# Patient Record
Sex: Female | Born: 1965 | Race: White | Hispanic: No | Smoking: Current every day smoker
Health system: Southern US, Community
[De-identification: ages and names within clinical notes are randomized; demographics above are authoritative.]

## PROBLEM LIST (undated history)

## (undated) DIAGNOSIS — I2699 Other pulmonary embolism without acute cor pulmonale: Secondary | ICD-10-CM

## (undated) DIAGNOSIS — G894 Chronic pain syndrome: Secondary | ICD-10-CM

## (undated) DIAGNOSIS — G43909 Migraine, unspecified, not intractable, without status migrainosus: Secondary | ICD-10-CM

## (undated) DIAGNOSIS — Z720 Tobacco use: Secondary | ICD-10-CM

## (undated) DIAGNOSIS — K635 Polyp of colon: Secondary | ICD-10-CM

## (undated) DIAGNOSIS — K5790 Diverticulosis of intestine, part unspecified, without perforation or abscess without bleeding: Secondary | ICD-10-CM

## (undated) DIAGNOSIS — F329 Major depressive disorder, single episode, unspecified: Secondary | ICD-10-CM

## (undated) DIAGNOSIS — Z800 Family history of malignant neoplasm of digestive organs: Secondary | ICD-10-CM

## (undated) DIAGNOSIS — K449 Diaphragmatic hernia without obstruction or gangrene: Secondary | ICD-10-CM

## (undated) DIAGNOSIS — R079 Chest pain, unspecified: Secondary | ICD-10-CM

## (undated) DIAGNOSIS — R1032 Left lower quadrant pain: Secondary | ICD-10-CM

## (undated) DIAGNOSIS — K219 Gastro-esophageal reflux disease without esophagitis: Secondary | ICD-10-CM

## (undated) DIAGNOSIS — G9050 Complex regional pain syndrome I, unspecified: Secondary | ICD-10-CM

## (undated) DIAGNOSIS — I10 Essential (primary) hypertension: Secondary | ICD-10-CM

## (undated) DIAGNOSIS — E785 Hyperlipidemia, unspecified: Secondary | ICD-10-CM

## (undated) DIAGNOSIS — H539 Unspecified visual disturbance: Secondary | ICD-10-CM

## (undated) DIAGNOSIS — K8020 Calculus of gallbladder without cholecystitis without obstruction: Secondary | ICD-10-CM

## (undated) DIAGNOSIS — G8929 Other chronic pain: Secondary | ICD-10-CM

## (undated) DIAGNOSIS — F419 Anxiety disorder, unspecified: Secondary | ICD-10-CM

## (undated) DIAGNOSIS — Z8669 Personal history of other diseases of the nervous system and sense organs: Secondary | ICD-10-CM

## (undated) HISTORY — DX: Hyperlipidemia, unspecified: E78.5

## (undated) HISTORY — PX: SHOULDER SURGERY: SHX246

## (undated) HISTORY — DX: Diverticulosis of intestine, part unspecified, without perforation or abscess without bleeding: K57.90

## (undated) HISTORY — DX: Other pulmonary embolism without acute cor pulmonale: I26.99

## (undated) HISTORY — DX: Family history of malignant neoplasm of digestive organs: Z80.0

## (undated) HISTORY — DX: Major depressive disorder, single episode, unspecified: F32.9

## (undated) HISTORY — DX: Personal history of other diseases of the nervous system and sense organs: Z86.69

## (undated) HISTORY — DX: Anxiety disorder, unspecified: F41.9

---

## 1997-05-03 ENCOUNTER — Other Ambulatory Visit: Admission: RE | Admit: 1997-05-03 | Discharge: 1997-05-03 | Payer: Self-pay | Admitting: *Deleted

## 1997-07-12 ENCOUNTER — Emergency Department (HOSPITAL_COMMUNITY): Admission: EM | Admit: 1997-07-12 | Discharge: 1997-07-12 | Payer: Self-pay | Admitting: Emergency Medicine

## 1998-02-24 ENCOUNTER — Ambulatory Visit (HOSPITAL_COMMUNITY): Admission: RE | Admit: 1998-02-24 | Discharge: 1998-02-24 | Payer: Self-pay | Admitting: Gastroenterology

## 1998-02-24 ENCOUNTER — Encounter: Payer: Self-pay | Admitting: Gastroenterology

## 1998-08-28 ENCOUNTER — Ambulatory Visit (HOSPITAL_COMMUNITY): Admission: RE | Admit: 1998-08-28 | Discharge: 1998-08-28 | Payer: Self-pay | Admitting: *Deleted

## 1998-08-28 ENCOUNTER — Encounter: Payer: Self-pay | Admitting: *Deleted

## 1998-08-29 ENCOUNTER — Ambulatory Visit (HOSPITAL_COMMUNITY): Admission: RE | Admit: 1998-08-29 | Discharge: 1998-08-29 | Payer: Self-pay | Admitting: *Deleted

## 1998-08-29 ENCOUNTER — Encounter: Payer: Self-pay | Admitting: *Deleted

## 1999-07-02 ENCOUNTER — Other Ambulatory Visit: Admission: RE | Admit: 1999-07-02 | Discharge: 1999-07-02 | Payer: Self-pay | Admitting: *Deleted

## 2000-06-04 ENCOUNTER — Other Ambulatory Visit: Admission: RE | Admit: 2000-06-04 | Discharge: 2000-06-04 | Payer: Self-pay | Admitting: *Deleted

## 2001-10-07 HISTORY — PX: COLONOSCOPY: SHX174

## 2001-11-03 ENCOUNTER — Encounter (INDEPENDENT_AMBULATORY_CARE_PROVIDER_SITE_OTHER): Payer: Self-pay | Admitting: Specialist

## 2001-11-03 ENCOUNTER — Ambulatory Visit (HOSPITAL_COMMUNITY): Admission: RE | Admit: 2001-11-03 | Discharge: 2001-11-03 | Payer: Self-pay | Admitting: Gastroenterology

## 2002-05-08 HISTORY — PX: LAPAROSCOPY: SHX197

## 2002-05-27 ENCOUNTER — Ambulatory Visit (HOSPITAL_COMMUNITY): Admission: RE | Admit: 2002-05-27 | Discharge: 2002-05-27 | Payer: Self-pay | Admitting: General Surgery

## 2002-05-28 ENCOUNTER — Emergency Department (HOSPITAL_COMMUNITY): Admission: EM | Admit: 2002-05-28 | Discharge: 2002-05-28 | Payer: Self-pay | Admitting: Emergency Medicine

## 2002-05-28 ENCOUNTER — Encounter: Payer: Self-pay | Admitting: General Surgery

## 2002-06-17 ENCOUNTER — Other Ambulatory Visit: Admission: RE | Admit: 2002-06-17 | Discharge: 2002-06-17 | Payer: Self-pay | Admitting: Obstetrics and Gynecology

## 2002-07-23 ENCOUNTER — Ambulatory Visit (HOSPITAL_COMMUNITY): Admission: RE | Admit: 2002-07-23 | Discharge: 2002-07-23 | Payer: Self-pay | Admitting: Emergency Medicine

## 2002-11-17 ENCOUNTER — Other Ambulatory Visit: Admission: RE | Admit: 2002-11-17 | Discharge: 2002-11-17 | Payer: Self-pay | Admitting: Obstetrics and Gynecology

## 2002-11-26 ENCOUNTER — Encounter: Admission: RE | Admit: 2002-11-26 | Discharge: 2002-11-26 | Payer: Self-pay | Admitting: Obstetrics and Gynecology

## 2003-07-27 ENCOUNTER — Encounter: Admission: RE | Admit: 2003-07-27 | Discharge: 2003-07-27 | Payer: Self-pay | Admitting: Family Medicine

## 2004-02-08 HISTORY — PX: ESOPHAGOGASTRODUODENOSCOPY: SHX1529

## 2004-02-20 ENCOUNTER — Ambulatory Visit (HOSPITAL_COMMUNITY): Admission: RE | Admit: 2004-02-20 | Discharge: 2004-02-20 | Payer: Self-pay | Admitting: Gastroenterology

## 2004-02-20 ENCOUNTER — Encounter (INDEPENDENT_AMBULATORY_CARE_PROVIDER_SITE_OTHER): Payer: Self-pay | Admitting: *Deleted

## 2009-01-07 HISTORY — PX: ENDOMETRIAL ABLATION: SHX621

## 2009-05-23 ENCOUNTER — Emergency Department (HOSPITAL_BASED_OUTPATIENT_CLINIC_OR_DEPARTMENT_OTHER): Admission: EM | Admit: 2009-05-23 | Discharge: 2009-05-23 | Payer: Self-pay | Admitting: Emergency Medicine

## 2009-05-23 ENCOUNTER — Ambulatory Visit: Payer: Self-pay | Admitting: Diagnostic Radiology

## 2010-03-26 LAB — URINALYSIS, ROUTINE W REFLEX MICROSCOPIC
Glucose, UA: NEGATIVE mg/dL
Hgb urine dipstick: NEGATIVE
Urobilinogen, UA: 0.2 mg/dL (ref 0.0–1.0)

## 2010-05-25 NOTE — Op Note (Signed)
NAME:  Dana Morrow, Dana Morrow                           ACCOUNT NO.:  [id number]   MEDICAL RECORD NO.:  [id number]                   PATIENT TYPE:  AMB   LOCATION:  ENDO                                 FACILITY:  [facility]   PHYSICIAN:  Petra Kuba, M.D.                 DATE OF BIRTH:  09-25-1965   DATE OF PROCEDURE:  DATE OF DISCHARGE:                                 OPERATIVE REPORT   PROCEDURE:  Colonoscopy with polypectomy.   INDICATIONS FOR PROCEDURE:  Abdominal pain, family history of colon cancer.  Consent was signed after risks, benefits, methods, and options were  thoroughly discussed in the office.   MEDICINES USED:  Demerol 100, Versed 10.   DESCRIPTION OF PROCEDURE:  Rectal inspection was pertinent for external  hemorrhoids. Digital exam was negative. The pediatric video adjustable  colonoscope was inserted, easily advanced around the colon to the cecum.  This did not require any abdominal pressure or any position changes.  Other  than some scattered diverticula, no abnormality was seen. The cecum was  identified by the appendiceal orifice and the ileocecal valve. In fact, the  scope was inserted a short ways into the terminal ileum which was normal.  Photo documentation was obtained. The scope was slowly withdrawn. The prep  was adequate. There was some liquid stool that required washing and  suctioning on slow withdrawal through the colon. The cecum, ascending and  transverse were normal except for some scattered diverticula. To the  proximal level of the splenic flexure, a questionable tiny 1-2 mm polyp on a  fold was seen; however, in turning the biopsy, it seemed to flatten out and  we could not it and we elected to withdraw. It had no worrisome stigmata.  The scope was then further withdrawn. There was a rare left sided  diverticula. In the distal sigmoid, a 1-2 mm polyp was seen and hot biopsied  x1. The scope was further withdrawn back to the rectum and retroflexed  pertinent for some tiny internal hemorrhoids. The scope was straightened and  readvanced a short ways up the left side of the colon, air was suctioned,  scope removed. The patient tolerated the procedure well. There was no  obvious or immediate complications.   ENDOSCOPIC DIAGNOSIS:  1. Internal and external hemorrhoids.  2. Right greater than left diverticula scattered.  3. Tiny distal sigmoid polyp hot biopsied.  4. Otherwise within normal limits to the end of the terminal ileum.    PLAN:  Await pathology to determine future colonic screening. Continue  Protonix since it seems to be helping. Consider upper GI small bowel follow  through or even an ultrasound next although since the patient is improving  with diet and exercise may just follow her clinically. Will schedule follow-  up in 6-8 weeks when we review the biopsies but happy to see back sooner,  continue testing as above  p.r.n.                                               Petra Kuba, M.D.    MEM/MEDQ  D:  11/03/2001  T:  11/03/2001  Job:  [id number]   cc:   Dr. Ned Card OB/GYN   Dr. Carolynn Sayers, [place]

## 2010-05-25 NOTE — Op Note (Signed)
NAME:  Dana Morrow, Dana Morrow                           ACCOUNT NO.:  [id number]   MEDICAL RECORD NO.:  [id number]                   PATIENT TYPE:  AMB   LOCATION:  DAY                                  FACILITY:  [facility]   PHYSICIAN:  Timothy E. Earlene Plater, M.D.              DATE OF BIRTH:  [birth year]   DATE OF PROCEDURE:  05/27/2002  DATE OF DISCHARGE:                                 OPERATIVE REPORT   PREOPERATIVE DIAGNOSIS:  Persistent chronic left lower quadrant pain.   POSTOPERATIVE DIAGNOSIS:  Persistent chronic left lower quadrant pain.   PROCEDURE:  Laparoscopy.   SURGEON:  Timothy E. Earlene Plater, M.D.   ANESTHESIA:  General.   INDICATIONS FOR PROCEDURE:  See the enclosed notes. Dana Morrow is 73, has  persistent left lower quadrant pain. She has been seen and evaluated on  several occasions by Maxie Better, M.D., Petra Kuba, M.D. and  Nolon Nations, M.D.  I have see the patient twice in the office for  extensive consultations regarding surgery for this pain. She has had  endoscopy, CT scan, small bowel series. Because of persistent pain, she  insists that she wishes to undergo laparoscopy. This has been carefully  discussed as well as the expected success and potential failures and  complications.   DESCRIPTION OF PROCEDURE:  The patient was seen and evaluated and the permit  signed.   She was taken to the operating room and placed supine, general endotracheal  anesthesia administered. The entire abdomen was prepped and draped in the  usual fashion. There was a Pfannenstiel incision from previous cesarean  section. There appeared to be two tiny circumareolar incisions, one superior  and one inferior. Marcaine 0.5% with epinephrine was used prior to each  incision. A horizontal incision made in the umbilical crease inferiorly,  fascia identified, opened vertically, peritoneum entered without  complications. The Hasson catheter placed and tied in place with a fascial  suture of #1  Vicryl. The abdomen was insufflated. With the patient in the  exaggerated head up position, the upper abdomen and mid abdomen were  carefully evaluated, the gallbladder had a normal appearance as did the  stomach. The remainder of the mid abdomen was covered with thick omentum.  The patient was then placed in the head down position and the pelvis and  lower mid abdomen were carefully evaluated. Photographs were made in each  area of each organ system and will be enumerated. In essence, there were  absolutely no adhesions. The colon and small bowel had absolutely a normal  appearance as did the appendix and gallbladder. The uterus appeared enlarged  and inflamed, both ovaries had some small adhesion processes. There was no  fluid in the pelvis nor abdomen on initial laparoscopy.   Photographs enclosed in the chart and one copy for my office chart include  10 photographs and they are:  1. Gallbladder.  2. Left  ovary.  3. Uterus.  4. Right ovary.  5. Pelvis.  6. Pelvis with uterus elevated.  7. Left lower quadrant.  8. Left lower quadrant with external compression.  9. Small bowel and pelvis.  10.      Appendix.   I did not find any etiology for her chronic persistent pain. The patient and  her consultants will be advised and followup will be as indicated. All  counts correct, she tolerated it well. All catheters, CO2 removed under  direct vision. The infraumbilical site was tied with existing #1 Vicryl and  the skin incisions closed with 3-0 Monocryl. Steri-Strips applied, final  counts correct, she tolerated it well and was removed to the recovery room  in good condition.   Instructions and Percocet 5 mg #30 were given and she will be followed as an  outpatient.                                               Timothy E. Earlene Plater, M.D.    TED/MEDQ  D:  05/27/2002  T:  05/27/2002  Job:  [id number]   cc:   Nolon Nations, M.D.   Petra Kuba, M.D.  [address], [address]   [place]  [place] [address]  Fax: [phone]   Maxie Better, M.D.  [address]  [address]  [place]  [place] [address]  Fax: [phone]

## 2010-05-25 NOTE — Op Note (Signed)
NAMESHAIRA, SOVA                 ACCOUNT NO.:  [id number]   MEDICAL RECORD NO.:  [id number]          PATIENT TYPE:  AMB   LOCATION:  ENDO                         FACILITY:  [facility]   PHYSICIAN:  Petra Kuba, M.D.    DATE OF BIRTH:  [birth year]   DATE OF PROCEDURE:  02/20/2004  DATE OF DISCHARGE:                                 OPERATIVE REPORT   PROCEDURE:  EGD with biopsy.   INDICATIONS:  Abdominal pain.  History of reflux.  Brother with esophageal  cancer.  Consent was signed after risks, benefits, methods, options were  thoroughly discussed in the office.   MEDICINES USED:  1.  Demerol 100.  2.  Versed 10.   PROCEDURE:  The video endoscope was inserted by direct vision.  The proximal  and mid-esophagus were normal.  In the distal esophagus was a small hiatal  hernia.  No obvious signs of Barrett's were seen.  The scope passed into the  stomach and advanced through a normal antrum, normal pylorus, into a normal  duodenal bulb and around the sweep to a normal second portion of the  duodenum.  The scope was withdrawn back to the bulb, and a good look there  ruled out ulcers in that location.  The scope was withdrawn back to the  stomach and retroflexed.  Angularis, cardia, fundus, lesser and greater  curve were normal on retroflexed visualization.  Straight visualization of  the stomach did not reveal any initial findings.  The scope was then slowly  withdrawn back to about 20 cm.  No additional findings were seen.  We went  ahead and took a few biopsies just above the GE junction to rule out any  microscopic Barrett's.  Air was suctioned.  Scope removed.  Again, a good  look at the esophagus was normal.  Scope was removed.  The patient tolerated  the procedure well.  There was no obvious immediate complication.   ENDOSCOPIC DIAGNOSES:  1.  Small hiatal hernia.  2.  Doubt Barrett's, with gastroesophageal junction biopsies.  3.  Otherwise normal esophagogastroduodenoscopy.   PLAN:  Await pathology.  Continue pump inhibitors.  Get her [facility]  ultrasound and CT scan to bring her chart up to date.  I would be happy to  see her back p.r.n.  Otherwise, return care to Dr. ___________ p.r.n.      MEM/MEDQ  D:  02/20/2004  T:  02/20/2004  Job:  [id number]   cc:   Petra Kuba, M.D.  [address]  [place]  [place] [address]  Fax: [phone]   __________, Judie Petit.D.

## 2010-11-19 ENCOUNTER — Emergency Department (HOSPITAL_BASED_OUTPATIENT_CLINIC_OR_DEPARTMENT_OTHER)
Admission: EM | Admit: 2010-11-19 | Discharge: 2010-11-20 | Disposition: A | Discharge status: Home / Self Care | Payer: No Typology Code available for payment source | Attending: Emergency Medicine | Admitting: Emergency Medicine

## 2010-11-19 ENCOUNTER — Emergency Department (INDEPENDENT_AMBULATORY_CARE_PROVIDER_SITE_OTHER): Payer: No Typology Code available for payment source

## 2010-11-19 ENCOUNTER — Emergency Department (HOSPITAL_COMMUNITY)
Admission: EM | Admit: 2010-11-19 | Discharge: 2010-11-19 | Disposition: A | Discharge status: Home / Self Care | Payer: Self-pay

## 2010-11-19 ENCOUNTER — Encounter: Payer: Self-pay | Admitting: *Deleted

## 2010-11-19 DIAGNOSIS — W19XXXA Unspecified fall, initial encounter: Secondary | ICD-10-CM

## 2010-11-19 DIAGNOSIS — M25519 Pain in unspecified shoulder: Secondary | ICD-10-CM | POA: Insufficient documentation

## 2010-11-19 DIAGNOSIS — T148XXA Other injury of unspecified body region, initial encounter: Secondary | ICD-10-CM

## 2010-11-19 DIAGNOSIS — M533 Sacrococcygeal disorders, not elsewhere classified: Secondary | ICD-10-CM | POA: Insufficient documentation

## 2010-11-19 HISTORY — DX: Complex regional pain syndrome I, unspecified: G90.50

## 2010-11-19 MED ORDER — ONDANSETRON 8 MG PO TBDP
8.0000 mg | ORAL_TABLET | Freq: Once | ORAL | Status: AC
  Administered 2010-11-19: 8 mg via ORAL
  Filled 2010-11-19: qty 1

## 2010-11-19 MED ORDER — HYDROCODONE-ACETAMINOPHEN 5-325 MG PO TABS
1.0000 | ORAL_TABLET | Freq: Once | ORAL | Status: AC
  Administered 2010-11-19: 1 via ORAL
  Filled 2010-11-19: qty 1

## 2010-11-19 NOTE — ED Notes (Signed)
Pt presents to ED today after fall from standing.  Pt reports she slipped on "a liquid maybe coffee" on the floor outside 2900 at main campus.  Pt reports that security was called and came to assist pt and family.  Pt was walked to [place] by security and was told at that time to call risk mgt in the morning and that it would be a 3 hour wait until being able to be evaluated by md.  Pt requested to leave [place] and come to our facility.  Pt was not given any paperwork and was not given a name or contact number for risk mgt.  Upon arrival to our facility, pt is ambulatory with pain and tenderness to left side of body.  Pts majority of pain centered above coccyx and is non-radiating.  Pt was given no pain meds at [facility].  Dr Oletta Lamas and Dr Dierdre Highman notified of patients condition and reason for fall.

## 2010-11-19 NOTE — ED Notes (Signed)
Pt. Reports falling on the L side of her hip and has L side body pain with Low back  Pain and tail bone pain.

## 2010-11-19 NOTE — ED Notes (Signed)
Dana Morrow, Charge RN called and spoke with Dana Morrow at [facility].  She was unaware of incident and stated she would call 2900 and find out particulars and what had been initiated.  She stated she would get back to [place] with further information.

## 2010-11-19 NOTE — ED Notes (Signed)
Spoke with Sharma Covert at [facility] who advised that a SZP needed to be filled out on this pt as one was not completed by anyone at [facility].  I advised Victorino Dike of reports from pt to include: employee laughing after fall, no one assisting her from floor, walking to ED and being told about 3 hour wait, and that pt was advised to contact risk mgt in the am and not given number or offered to have call placed for her.  She further advised that if a member of mgt was available to see pt then they should be advised of situation.  I informed her that at night we did not have anyone in the building other than the charge RN.

## 2010-11-19 NOTE — ED Provider Notes (Signed)
History     CSN: [id number] Arrival date & time: 11/19/2010 10:40 PM   First MD Initiated Contact with Patient 11/19/10 2259      Chief Complaint  Patient presents with  . Back Pain    Pt. reports falling at approx. 2000 tonight at [facility]. in the ICU 2900 area.  Pt. reported the incident and security came to scene.  Pt. had a full report done.  . Tailbone Pain    (Consider location/radiation/quality/duration/timing/severity/associated sxs/prior treatment) Patient is a 45 y.o. female presenting with back pain. The history is provided by the patient.  Back Pain  This is a new problem. The current episode started 3 to 5 hours ago. The problem occurs constantly. The problem has not changed since onset.The pain is associated with falling. Pain location: tail bone mostly L sided. Quality: sharp. The pain does not radiate. The pain is moderate. The symptoms are aggravated by twisting. Pertinent negatives include no chest pain, no fever, no numbness, no headaches, no abdominal pain, no bowel incontinence, no perianal numbness, no bladder incontinence, no leg pain, no paresthesias, no paresis, no tingling and no weakness.   States she slipped while ambulating and fell onto there buttocks where she hurts, between 7:30-8pm tonight. She fell mostly onto her L sided and also had some L shoulder discomfort. She denies striking her head or hurting her neck. She was able to ambulate after event. No knee or ankle injury. No wrist, elbow or hand injury.  She has h/o RSD and some low back pain in the past that has required injections, but denies any recent back issues.  She denies any mid or lower back tonight, she localizes pain to her sacral area. No weakness in her extremities.   Past Medical History  Diagnosis Date  . RSD (reflex sympathetic dystrophy)     L shoulder    Past Surgical History  Procedure Date  . Cesarean section   . Shoulder surgery     No family history on  file.  History  Substance Use Topics  . Smoking status: Not on file  . Smokeless tobacco: Not on file  . Alcohol Use:     OB History    Grav Para Term Preterm Abortions TAB SAB Ect Mult Living                  Review of Systems  Constitutional: Negative for fever and chills.  HENT: Negative for neck pain and neck stiffness.   Eyes: Negative for pain.  Respiratory: Negative for shortness of breath.   Cardiovascular: Negative for chest pain and leg swelling.  Gastrointestinal: Positive for vomiting. Negative for abdominal pain and bowel incontinence.  Genitourinary: Negative for bladder incontinence, hematuria and flank pain.  Musculoskeletal: Negative for joint swelling and gait problem.  Skin: Negative for rash and wound.  Neurological: Negative for tingling, weakness, numbness, headaches and paresthesias.  All other systems reviewed and are negative.    Allergies  Levaquin  Home Medications   Current Outpatient Rx  Name Route Sig Dispense Refill  . METOPROLOL SUCCINATE 25 MG PO TB24 Oral Take 25 mg by mouth daily.      . MORPHINE SULFATE ER 30 MG PO TB12 Oral Take 30 mg by mouth every 8 (eight) hours.      [name] ONE-DAILY MULTI VITAMINS PO TABS Oral Take 1 tablet by mouth daily.        BP 161/93  Pulse 82  Temp(Src) 98.1 F (36.7 C) (  Oral)  Resp 20  Ht 5\' 3"  (1.6 m)  Wt 163 lb (73.936 kg)  BMI 28.87 kg/m2  SpO2 99%  Physical Exam  Constitutional: She is oriented to person, place, and time. She appears well-developed and well-nourished.  HENT:  Head: Normocephalic and atraumatic.  Eyes: Conjunctivae and EOM are normal. Pupils are equal, round, and reactive to light.  Neck: Full passive range of motion without pain. Neck supple. No thyromegaly present.       No cervical, thoracic or lumbar tenderness or deformity.   Cardiovascular: Normal rate, regular rhythm, S1 normal, S2 normal and intact distal pulses.   Pulmonary/Chest: Effort normal and breath sounds  normal.  Abdominal: Soft. Bowel sounds are normal. There is no tenderness. There is no CVA tenderness.  Musculoskeletal: Normal range of motion.       Mild TTP over left shoulder, no deformity or step off.  Dec ROM 2/2 pain. Skin intact. No clavicular or elbow tenderness. No wrist or hand tenderness with FROM at elbow and wrist and distal N/V intact.  LEs good ROM, no hip or knee or ankle tenderness.  She is TTP over sacral area without any appreciable swelling, erythema or deformity. Distal strengths and sensorium to light touch intact and equal BLEs with adequate dpps.   Neurological: She is alert and oriented to person, place, and time. She has normal strength and normal reflexes. No cranial nerve deficit or sensory deficit. She displays a negative Romberg sign. GCS eye subscore is 4. GCS verbal subscore is 5. GCS motor subscore is 6.       Normal Gait  Skin: Skin is warm and dry. No rash noted. No cyanosis. Nails show no clubbing.  Psychiatric: She has a normal mood and affect. Her speech is normal and behavior is normal.    ED Course  Procedures (including critical care time)   Results for orders placed during the hospital encounter of 05/23/09  URINALYSIS, ROUTINE W REFLEX MICROSCOPIC      Component Value Range   Color, Urine AMBER BIOCHEMICALS MAY BE AFFECTED BY COLOR (*) YELLOW    Appearance CLOUDY (*) CLEAR    Specific Gravity, Urine 1.037 (*) 1.005 - 1.030    pH 6.0  5.0 - 8.0    Glucose, UA NEGATIVE  NEGATIVE (mg/dL)   Hgb urine dipstick NEGATIVE  NEGATIVE    Bilirubin Urine SMALL (*) NEGATIVE    Ketones, ur NEGATIVE  NEGATIVE (mg/dL)   Protein, ur NEGATIVE  NEGATIVE (mg/dL)   Urobilinogen, UA 0.2  0.0 - 1.0 (mg/dL)   Nitrite NEGATIVE  NEGATIVE    Leukocytes, UA    NEGATIVE    Value: NEGATIVE MICROSCOPIC NOT DONE ON URINES WITH NEGATIVE PROTEIN, BLOOD, LEUKOCYTES, NITRITE, OR GLUCOSE <1000 mg/dL.   Dg Sacrum/coccyx  11/19/2010  *RADIOLOGY REPORT*  Clinical Data:  45 year old female status post fall with tail bone pain.  SACRUM AND COCCYX - 2+ VIEW  Comparison: CT pelvis 05/23/2009.  Findings: Stable lateral configuration of the sacrum and coccygeal segments. Sacral ala appear intact.  SI joints within normal limits.  Visualized pelvis and lower lumbar levels appear grossly intact.  IMPRESSION: No acute fracture or dislocation identified about the sacrum or coccyx.  Original Report Authenticated By: Harley Hallmark, M.D.   Dg Shoulder Left  11/19/2010  *RADIOLOGY REPORT*  Clinical Data: Status post fall; left shoulder pain.  LEFT SHOULDER - 2+ VIEW  Comparison: None.  Findings: There is no evidence of fracture or dislocation.  The  left humeral head is seated within the glenoid fossa.  The acromioclavicular joint is unremarkable in appearance.  No significant soft tissue abnormalities are seen.  The visualized portions of the left lung are clear.  IMPRESSION: No evidence of fracture or dislocation.  Original Report Authenticated By: Tonia Ghent, M.D.      MDM   Fall with sacral and left shoulder pain, no deficits. Pain control, ice and imaging obtained and reviewed as above. Plan sling LUE for comfort and cont home pain meds as needed. NSAIDS Rx and follow up PCP. Ortho referral prn persistent shoulder pain and possible soft tissue injury.        Sunnie Nielsen, MD 11/20/10 [phone]

## 2010-11-20 ENCOUNTER — Encounter (HOSPITAL_BASED_OUTPATIENT_CLINIC_OR_DEPARTMENT_OTHER): Payer: Self-pay | Admitting: *Deleted

## 2010-11-20 MED ORDER — ONDANSETRON HCL 4 MG PO TABS: 4.0000 mg | ORAL_TABLET | Freq: Four times a day (QID) | ORAL | Status: AC

## 2010-11-20 MED ORDER — NAPROXEN 375 MG PO TABS: 375.0000 mg | ORAL_TABLET | Freq: Two times a day (BID) | ORAL | Status: AC

## 2010-11-20 NOTE — ED Notes (Signed)
Spoke with Angelena,  Night shift Sec who had information re: initial phone call from [facility] ED.  She stated that a female had called from [facility] ED with the pt sitting in front of her and stated "I am sending her to you because we have a 3+hr wait"  She had gotten pts name and DOB direct from pt.  Informed Sharma Covert of above information

## 2010-11-20 NOTE — ED Notes (Signed)
Spoke with Sharma Covert at [facility].  She stated she followed up with [place] and was informed that pt was listed as an elopement as she left before she was triaged.  I re-iterated the fact that pt should not have had to sit in triage nor be told that she would have to wait 3+hrs.  Victorino Dike agreed and stated she would continue to try and follow up with security

## 2010-11-20 NOTE — ED Notes (Signed)
Spoke with Victorino Dike again prior to discharging pt.  She advised she would be contacting risk mgt and they would/will contact pt.  I gave the pt and family the phone number of our dept director if any further follow up was needed.

## 2011-01-08 DIAGNOSIS — F419 Anxiety disorder, unspecified: Secondary | ICD-10-CM

## 2011-01-08 DIAGNOSIS — F32A Depression, unspecified: Secondary | ICD-10-CM

## 2011-01-08 DIAGNOSIS — Z8669 Personal history of other diseases of the nervous system and sense organs: Secondary | ICD-10-CM

## 2011-01-08 HISTORY — DX: Anxiety disorder, unspecified: F41.9

## 2011-01-08 HISTORY — DX: Depression, unspecified: F32.A

## 2011-01-08 HISTORY — DX: Personal history of other diseases of the nervous system and sense organs: Z86.69

## 2011-10-19 HISTORY — PX: LAPAROSCOPIC CHOLECYSTECTOMY: SUR755

## 2011-12-18 ENCOUNTER — Observation Stay (HOSPITAL_BASED_OUTPATIENT_CLINIC_OR_DEPARTMENT_OTHER)
Admission: EM | Admit: 2011-12-18 | Discharge: 2011-12-19 | Disposition: A | Discharge status: Home / Self Care | Payer: Medicare Other | Attending: Emergency Medicine | Admitting: Emergency Medicine

## 2011-12-18 ENCOUNTER — Emergency Department (HOSPITAL_BASED_OUTPATIENT_CLINIC_OR_DEPARTMENT_OTHER): Payer: Medicare Other

## 2011-12-18 ENCOUNTER — Encounter (HOSPITAL_BASED_OUTPATIENT_CLINIC_OR_DEPARTMENT_OTHER): Payer: Self-pay | Admitting: *Deleted

## 2011-12-18 DIAGNOSIS — F17200 Nicotine dependence, unspecified, uncomplicated: Secondary | ICD-10-CM | POA: Insufficient documentation

## 2011-12-18 DIAGNOSIS — Z8739 Personal history of other diseases of the musculoskeletal system and connective tissue: Secondary | ICD-10-CM | POA: Insufficient documentation

## 2011-12-18 DIAGNOSIS — R0789 Other chest pain: Secondary | ICD-10-CM | POA: Insufficient documentation

## 2011-12-18 DIAGNOSIS — G459 Transient cerebral ischemic attack, unspecified: Secondary | ICD-10-CM | POA: Insufficient documentation

## 2011-12-18 DIAGNOSIS — Z8719 Personal history of other diseases of the digestive system: Secondary | ICD-10-CM | POA: Insufficient documentation

## 2011-12-18 DIAGNOSIS — K219 Gastro-esophageal reflux disease without esophagitis: Secondary | ICD-10-CM | POA: Insufficient documentation

## 2011-12-18 DIAGNOSIS — G43909 Migraine, unspecified, not intractable, without status migrainosus: Principal | ICD-10-CM | POA: Insufficient documentation

## 2011-12-18 DIAGNOSIS — Z79899 Other long term (current) drug therapy: Secondary | ICD-10-CM | POA: Insufficient documentation

## 2011-12-18 HISTORY — DX: Chest pain, unspecified: R07.9

## 2011-12-18 HISTORY — DX: Polyp of colon: K63.5

## 2011-12-18 HISTORY — DX: Diaphragmatic hernia without obstruction or gangrene: K44.9

## 2011-12-18 HISTORY — DX: Unspecified visual disturbance: H53.9

## 2011-12-18 HISTORY — DX: Gastro-esophageal reflux disease without esophagitis: K21.9

## 2011-12-18 HISTORY — DX: Tobacco use: Z72.0

## 2011-12-18 HISTORY — DX: Migraine, unspecified, not intractable, without status migrainosus: G43.909

## 2011-12-18 HISTORY — DX: Left lower quadrant pain: R10.32

## 2011-12-18 HISTORY — DX: Calculus of gallbladder without cholecystitis without obstruction: K80.20

## 2011-12-18 HISTORY — DX: Other chronic pain: G89.29

## 2011-12-18 LAB — CBC WITH DIFFERENTIAL/PLATELET
Basophils Absolute: 0.1 10*3/uL (ref 0.0–0.1)
Basophils Relative: 1 % (ref 0–1)
Eosinophils Absolute: 0.2 10*3/uL (ref 0.0–0.7)
Eosinophils Relative: 2 % (ref 0–5)
HCT: 40.1 % (ref 36.0–46.0)
Lymphs Abs: 4.4 10*3/uL — ABNORMAL HIGH (ref 0.7–4.0)
MCHC: 35.4 g/dL (ref 30.0–36.0)
MCV: 90.5 fL (ref 78.0–100.0)
Monocytes Absolute: 1 10*3/uL (ref 0.1–1.0)
Monocytes Relative: 8 % (ref 3–12)
Platelets: 273 10*3/uL (ref 150–400)
RDW: 13.5 % (ref 11.5–15.5)
WBC: 12.6 10*3/uL — ABNORMAL HIGH (ref 4.0–10.5)

## 2011-12-18 LAB — BASIC METABOLIC PANEL
BUN: 11 mg/dL (ref 6–23)
Chloride: 104 mEq/L (ref 96–112)
GFR calc non Af Amer: 86 mL/min — ABNORMAL LOW (ref >90)
Glucose, Bld: 125 mg/dL — ABNORMAL HIGH (ref 70–99)
Potassium: 3.5 mEq/L (ref 3.5–5.1)

## 2011-12-18 MED ORDER — ENOXAPARIN SODIUM 40 MG/0.4ML ~~LOC~~ SOLN
40.0000 mg | SUBCUTANEOUS | Status: DC
  Administered 2011-12-19: 40 mg via SUBCUTANEOUS
  Filled 2011-12-18 (×2): qty 0.4

## 2011-12-18 MED ORDER — PROMETHAZINE HCL 25 MG/ML IJ SOLN
25.0000 mg | Freq: Once | INTRAMUSCULAR | Status: AC
  Administered 2011-12-18: 25 mg via INTRAVENOUS
  Filled 2011-12-18: qty 1

## 2011-12-18 MED ORDER — MORPHINE SULFATE 4 MG/ML IJ SOLN
4.0000 mg | Freq: Once | INTRAMUSCULAR | Status: AC
  Administered 2011-12-18: 4 mg via INTRAVENOUS

## 2011-12-18 MED ORDER — MORPHINE SULFATE 4 MG/ML IJ SOLN
INTRAMUSCULAR | Status: AC
  Administered 2011-12-18: 4 mg via INTRAVENOUS
  Filled 2011-12-18: qty 1

## 2011-12-18 MED ORDER — LISINOPRIL 20 MG PO TABS
20.0000 mg | ORAL_TABLET | Freq: Every day | ORAL | Status: DC
  Administered 2011-12-19: 20 mg via ORAL
  Filled 2011-12-18: qty 1

## 2011-12-18 MED ORDER — HYDROMORPHONE HCL PF 1 MG/ML IJ SOLN
1.0000 mg | Freq: Once | INTRAMUSCULAR | Status: AC
  Administered 2011-12-18: 1 mg via INTRAVENOUS
  Filled 2011-12-18: qty 1

## 2011-12-18 MED ORDER — ASPIRIN 325 MG PO TABS
325.0000 mg | ORAL_TABLET | Freq: Every day | ORAL | Status: DC
  Administered 2011-12-19: 325 mg via ORAL
  Filled 2011-12-18: qty 1

## 2011-12-18 MED ORDER — PANTOPRAZOLE SODIUM 40 MG PO TBEC
40.0000 mg | DELAYED_RELEASE_TABLET | Freq: Every day | ORAL | Status: DC
  Administered 2011-12-19: 40 mg via ORAL
  Filled 2011-12-18: qty 1

## 2011-12-18 MED ORDER — ONDANSETRON HCL 4 MG/2ML IJ SOLN
4.0000 mg | Freq: Once | INTRAMUSCULAR | Status: AC
  Administered 2011-12-18: 4 mg via INTRAVENOUS

## 2011-12-18 MED ORDER — ONDANSETRON HCL 4 MG/2ML IJ SOLN
INTRAMUSCULAR | Status: AC
  Administered 2011-12-18: 4 mg via INTRAVENOUS
  Filled 2011-12-18: qty 2

## 2011-12-18 MED ORDER — ADULT MULTIVITAMIN W/MINERALS CH
1.0000 | ORAL_TABLET | Freq: Every day | ORAL | Status: DC
  Administered 2011-12-19: 1 via ORAL
  Filled 2011-12-18: qty 1

## 2011-12-18 NOTE — ED Notes (Addendum)
Pt states that she has had a headache for 5 days. Pt states that when driving today she went blind in her L eye. She states that she has experienced this about 5 times in the past year.  She states that she was diagnosed with complex migraines.  Pt states that starting today she began feeling tightness in the middle of her chest. Fiance at bedside. Pt currently rates that pain in her chest and head at 5. Pt states that her mom died last Christmas Eve from a massive heart attack.

## 2011-12-18 NOTE — ED Notes (Signed)
MD at bedside. 

## 2011-12-18 NOTE — ED Notes (Signed)
Pt states she has also had some tightness in chest today.

## 2011-12-18 NOTE — ED Notes (Signed)
Pt returned from radiology via stretcher

## 2011-12-18 NOTE — ED Notes (Signed)
Pt states she has had a HA since sat that has waxed and waned. Pt came to ED tonight due to having episode of "blindness" in left eye tonight that lasted approximately 5 min while driving tonight. Pt also reports some dizziness at that time.

## 2011-12-18 NOTE — ED Notes (Signed)
[facility] here for transport now.

## 2011-12-18 NOTE — ED Notes (Addendum)
Pt c/o " complicated migraine" x 4 days, recent stress test neg

## 2011-12-18 NOTE — ED Notes (Signed)
MD made aware of pain, orders received and initiated

## 2011-12-18 NOTE — ED Notes (Signed)
PA at bedside.

## 2011-12-18 NOTE — ED Provider Notes (Signed)
History     CSN: [id number]  Arrival date & time 12/18/11  2021   First MD Initiated Contact with Patient 12/18/11 2032      Chief Complaint  Patient presents with  . 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 No language interpreter was used.    Past Medical History  Diagnosis Date  . RSD (reflex sympathetic dystrophy)     L shoulder  . Migraine     Past Surgical History  Procedure Date  . Cesarean section   . Shoulder surgery   . Cholecystectomy     History reviewed. No pertinent family history.  History  Substance Use Topics  . Smoking status: Current Every Day Smoker -- 0.5 packs/day    Types: Cigarettes  . Smokeless tobacco: Not on file  . Alcohol Use: No    OB History    Grav Para Term Preterm Abortions TAB SAB Ect Mult Living                  Review of Systems  Constitutional: Negative.   Respiratory: Negative.   Cardiovascular: Negative.     Allergies  Levaquin  Home Medications   Current Outpatient Rx  Name  Route  Sig  Dispense  Refill  . LISINOPRIL 20 MG PO TABS   Oral   Take 20 mg by mouth daily.         [name] ONE-DAILY MULTI VITAMINS PO TABS   Oral   Take 1 tablet by mouth daily.           [name] OMEPRAZOLE 20 MG PO CPDR   Oral   Take 20 mg by mouth daily.         [name] METOPROLOL SUCCINATE ER 25 MG PO TB24   Oral   Take 25 mg by mouth daily.           . MORPHINE SULFATE ER 30 MG PO TB12   Oral   Take 30 mg  by mouth every 8 (eight) hours.             BP 148/76  Pulse 92  Temp 98.1 F (36.7 C) (Oral)  Resp 18  Ht 5\' 3"  (1.6 m)  Wt 178 lb (80.74 kg)  BMI 31.53 kg/m2  SpO2 99%  Physical Exam  Nursing note and vitals reviewed. Constitutional: She is oriented to person, place, and time. She appears well-developed and well-nourished.  HENT:  Head: Normocephalic and atraumatic.  Right Ear: External ear normal.  Left Ear: External ear normal.  Mouth/Throat: Oropharynx is clear and moist.  Eyes: Conjunctivae normal and EOM are normal. Pupils are equal, round, and reactive to light.  Neck: Normal range of motion. Neck supple.  Cardiovascular: Normal rate and regular rhythm.   Pulmonary/Chest: Effort normal and breath sounds normal.  Musculoskeletal: Normal range of motion.  Neurological: She is alert and oriented to person, place, and time. She exhibits normal muscle tone. Coordination normal.  Skin: Skin is warm and dry.  Psychiatric: She  has a normal mood and affect.    ED Course  Procedures (including critical care time)  Labs Reviewed - No data to display Dg Chest 2 View  12/18/2011  *RADIOLOGY REPORT*  Clinical Data: Migraine headache for 5 days, hypertension, smoker  CHEST - 2 VIEW  Comparison: None  Findings: Upper normal heart size. Mediastinal contours and pulmonary vascularity normal. Lungs clear. No pleural effusion or pneumothorax. Bones unremarkable.  IMPRESSION: No acute abnormalities.   Original Report Authenticated By: Ulyses Southward, M.D.    Ct Head Wo Contrast  12/18/2011  *RADIOLOGY REPORT*  Clinical Data: Severe headache, blurred vision, history migraines  CT HEAD WITHOUT CONTRAST  Technique:  Contiguous axial images were obtained from the base of the skull through the vertex without contrast.  Comparison: None  Findings: Normal ventricular morphology. No midline shift or mass effect. Normal appearance of brain parenchyma. No intracranial hemorrhage, mass lesion, or  acute infarction. Visualized paranasal sinuses and mastoid air cells clear. Bones unremarkable.  IMPRESSION: No acute intracranial abnormalities.   Original Report Authenticated By: Ulyses Southward, M.D.      Date: 12/18/2011  Rate:91  Rhythm: normal sinus rhythm  QRS Axis: normal  Intervals: normal  ST/T Wave abnormalities: normal  Conduction Disutrbances:none  Narrative Interpretation:   Old EKG Reviewed: none available   No diagnosis found.    MDM  [facility] pt [facility] to [facility] for [facility] protocol:pt accepted by Dr.yelverton:pt is symptom free at this time:pt has htn and high cholesterol and smoker as risk factors        Teressa Lower, NP 12/18/11 2222

## 2011-12-18 NOTE — ED Provider Notes (Signed)
Patient placed in CDU by Teressa Lower, PA-C for TIA protocol; patient transferred from med center [facility].  Patient is here for 5 days of headache and left eye blindness and has received labs and imaging.   Plan per previous provider is to complete a TIA protocol in the morning and consult neurology if MRI is positive.  Patient re-evaluated and is resting comfortably, VSS, with no new complaints or concerns at this time.  On exam: hemodynamically stable, NAD, heart w/ RRR, lungs CTAB, Chest & abd non-tender, no peripheral edema or calf tenderness.  BP 148/76  Pulse 92  Temp 98.1 F (36.7 C) (Oral)  Resp 18  Ht 5\' 3"  (1.6 m)  Wt 178 lb (80.74 kg)  BMI 31.53 kg/m2  SpO2 99%  Discussed with patient current lab and imaging results as well as their care plan, patient questions answered.  Patient is amenable to the plan.  Pt c/o increased nausea, will give Zofran.     Dahlia Client Dayannara Pascal, PA-C 12/19/11 0000

## 2011-12-19 ENCOUNTER — Observation Stay (HOSPITAL_COMMUNITY): Payer: Medicare Other

## 2011-12-19 ENCOUNTER — Encounter (HOSPITAL_COMMUNITY): Payer: Self-pay | Admitting: Nurse Practitioner

## 2011-12-19 DIAGNOSIS — G459 Transient cerebral ischemic attack, unspecified: Secondary | ICD-10-CM

## 2011-12-19 DIAGNOSIS — R0789 Other chest pain: Secondary | ICD-10-CM

## 2011-12-19 LAB — APTT: aPTT: 32 seconds (ref 24–37)

## 2011-12-19 LAB — URINALYSIS, ROUTINE W REFLEX MICROSCOPIC
Bilirubin Urine: NEGATIVE
Ketones, ur: NEGATIVE mg/dL
Leukocytes, UA: NEGATIVE
Nitrite: NEGATIVE
Protein, ur: NEGATIVE mg/dL
Urobilinogen, UA: 1 mg/dL (ref 0.0–1.0)

## 2011-12-19 LAB — POCT I-STAT TROPONIN I: Troponin i, poc: 0.01 ng/mL (ref 0.00–0.08)

## 2011-12-19 LAB — HEMOGLOBIN A1C
Hgb A1c MFr Bld: 5.7 % — ABNORMAL HIGH (ref <5.7)
Mean Plasma Glucose: 117 mg/dL — ABNORMAL HIGH (ref <117)

## 2011-12-19 LAB — PROTIME-INR
INR: 0.94 (ref 0.00–1.49)
Prothrombin Time: 12.5 seconds (ref 11.6–15.2)

## 2011-12-19 LAB — LIPID PANEL
HDL: 40 mg/dL (ref >39)
LDL Cholesterol: 150 mg/dL — ABNORMAL HIGH (ref 0–99)

## 2011-12-19 LAB — TROPONIN I: Troponin I: <0.3 ng/mL (ref <0.30)

## 2011-12-19 MED ORDER — DIPHENHYDRAMINE HCL 50 MG/ML IJ SOLN
25.0000 mg | Freq: Once | INTRAMUSCULAR | Status: AC
  Administered 2011-12-19: 25 mg via INTRAVENOUS
  Filled 2011-12-19: qty 1

## 2011-12-19 MED ORDER — METOCLOPRAMIDE HCL 5 MG/ML IJ SOLN
10.0000 mg | Freq: Once | INTRAMUSCULAR | Status: AC
  Administered 2011-12-19: 10 mg via INTRAVENOUS
  Filled 2011-12-19: qty 2

## 2011-12-19 MED ORDER — ONDANSETRON HCL 4 MG/2ML IJ SOLN
4.0000 mg | Freq: Four times a day (QID) | INTRAMUSCULAR | Status: DC | PRN
  Administered 2011-12-19: 4 mg via INTRAVENOUS
  Filled 2011-12-19: qty 2

## 2011-12-19 MED ORDER — SODIUM CHLORIDE 0.9 % IV SOLN
INTRAVENOUS | Status: DC
  Administered 2011-12-19: 10:00:00 via INTRAVENOUS

## 2011-12-19 NOTE — ED Provider Notes (Signed)
Patient with a hx sig for diet controlled "diabeties & HLD risk", HTN and complex migraines was placed in CDU on TIA protocol by Pickering, PA-C. Patient care resumed from [facility] and accepted by Pod A.  Patient is here for MRI/MRA & dopplers. While in obeservation over night the pt slept well, but per reviewing nursing note she had a new complaint of chest pressure. The pain lasted 1-2 hours, was located substernal without radiation & rated at a 8/10 in severity 2 mo ago pt had somewhat similar pain that was caused by cholelithiasis of which she had a cholecystectomy for. She states she does not have a cardiologist, but did have a negative stress test performed. Positive family history for MI.  Patient re-evaluated and is resting comfortable currently CP free, VSS On exam: NAD, heart w/ RRR, lungs CTAB, Chest & abd non-tender, no peripheral edema or calf tenderness. Repeat EKG and trop ordered. Will page cardiology to admit for CP r/o.    MRI HEAD Findings: Ventricle size is normal. Craniocervical junction is normal. Pituitary is normal in size. Negative for acute or chronic infarct. Negative for demyelinating disease. Cerebral white matter is normal. Basal ganglia and brainstem are normal. Negative for intracranial hemorrhage or fluid collection. No mass or edema is present. There is a cyst versus mucosal edema in the left maxillary sinus. Remaining sinuses are clear. IMPRESSION: No significant intracranial stenosis. Mild chronic sinus mucosal disease.  MRA HEAD Findings: Both vertebral arteries are patent to the basilar. Left PICA is patent. Right PICA not visualized. There is a prominent right AICA which may supplies PICA territory. The basilar is widely patent. Superior cerebellar and posterior cerebral arteries are patent. Mild stenosis in the right posterior cerebral artery. Internal carotid artery is patent bilaterally without significant stenosis. Anterior and middle cerebral  arteries are patent bilaterally. Negtive for cerebral aneurysm. IMPRESSION: Mild stenosis right posterior cerebral artery. No large vessel occlusion. Original Report Authenticated By: Janeece Riggers, M.D.  VASCULAR LAB  PRELIMINARY PRELIMINARY PRELIMINARY PRELIMINARY  Carotid Dopplers completed.  Preliminary report: There is no ICA stenosis. Vertebral artery flow is antegrade.  KANADY, CANDACE, RVT  12/19/2011, 8:55 AM  2D Echo Study Conclusions - Left ventricle: The cavity size was normal. Systolic function was normal. The estimated ejection fraction was in the range of 55% to 60%. Wall motion was normal; there were no regional wall motion abnormalities. - Aortic valve: Trivial regurgitation. - Atrial septum: No defect or patent foramen ovale was identified. Impressions: - No cardiac source of emboli was indentified. Transthoracic echocardiography. M-mode, complete 2D, spectral Doppler, and color Doppler. Height: Height: 170.2cm. Height: 67in. Blood pressure: 137/78. Patient status: Observation. Location: Echo laboratory.  Consult Neurology: Above results of Mild stenosis does not need to be followed up as an OP Consult Cardiology: [facility] to admit for CP r/o   Date: 12/19/2011  Rate: 70  Rhythm: normal sinus rhythm  QRS Axis: normal  Intervals: normal  ST/T Wave abnormalities: normal  Conduction Disutrbances: none  Narrative Interpretation: Consider Ant Infarct, diminished R  Old EKG Reviewed: No significant changes noted  Concern for cardiac etiology of Chest Pain. Cardiology has been consulted as above and will admit patient. Pt does not meet criteria for CP protocol and a further evaluation is recommended. Pt has been re-evaluated prior to consult and VSS, NAD, heart RRR, pain 0/10, lungs CTAB. No acute abnormalities found on EKG and first round of cardiac enzymes negative.     Jaci Carrel, [place]  12/19/11 1101 
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
## 2011-12-19 NOTE — ED Notes (Signed)
Pt return radiology

## 2011-12-19 NOTE — ED Provider Notes (Signed)
Medical screening examination/treatment/procedure(s) were conducted as a shared visit with non-physician practitioner(s) and myself.  I personally evaluated the patient during the encounter  Hx migraine headaches with typical gradual onset headache x 5 days, developed transient visual loss in L eye x 5 mins now resolved. Has occurred 5 times previously, thought to be migrainous by her neurologist. No focal weakness, numbness, tingling. Patient reports never had TIA workup.  Glynn Octave, MD 12/19/11 [phone]

## 2011-12-19 NOTE — ED Provider Notes (Deleted)
Medical screening examination/treatment/procedure(s) were performed by non-physician practitioner and as supervising physician I was immediately available for consultation/collaboration.  Tyaire Odem M Orey Moure, MD 12/19/11 0231 

## 2011-12-19 NOTE — ED Notes (Signed)
Pt denies any N/V.

## 2011-12-19 NOTE — Progress Notes (Signed)
Utilization review completed.  P.J. Audreena Sachdeva,RN,BSN Case Manager [phone]  

## 2011-12-19 NOTE — Progress Notes (Signed)
  Echocardiogram 2D Echocardiogram has been performed.  Dana Morrow 12/19/2011, 9:28 AM

## 2011-12-19 NOTE — Progress Notes (Signed)
VASCULAR LAB PRELIMINARY  PRELIMINARY  PRELIMINARY  PRELIMINARY  Carotid Dopplers completed.    Preliminary report:  There is no ICA stenosis.  Vertebral artery flow is antegrade.  Nechuma Boven, RVT 12/19/2011, 8:55 AM

## 2011-12-19 NOTE — ED Notes (Signed)
Patient transported to CT 

## 2011-12-19 NOTE — ED Provider Notes (Signed)
Medical screening examination/treatment/procedure(s) were performed by non-physician practitioner and as supervising physician I was immediately available for consultation/collaboration.   Laray Anger, DO 12/19/11 2150

## 2012-01-08 HISTORY — PX: UMBILICAL HERNIA REPAIR: SHX196

## 2012-02-02 ENCOUNTER — Encounter (HOSPITAL_BASED_OUTPATIENT_CLINIC_OR_DEPARTMENT_OTHER): Payer: Self-pay | Admitting: *Deleted

## 2012-02-02 ENCOUNTER — Emergency Department (HOSPITAL_BASED_OUTPATIENT_CLINIC_OR_DEPARTMENT_OTHER)
Admission: EM | Admit: 2012-02-02 | Discharge: 2012-02-02 | Disposition: A | Discharge status: Home / Self Care | Payer: Medicare Other | Attending: Emergency Medicine | Admitting: Emergency Medicine

## 2012-02-02 DIAGNOSIS — F17200 Nicotine dependence, unspecified, uncomplicated: Secondary | ICD-10-CM | POA: Insufficient documentation

## 2012-02-02 DIAGNOSIS — G8929 Other chronic pain: Secondary | ICD-10-CM | POA: Insufficient documentation

## 2012-02-02 DIAGNOSIS — Z860100 Personal history of colon polyps, unspecified: Secondary | ICD-10-CM | POA: Insufficient documentation

## 2012-02-02 DIAGNOSIS — Z8719 Personal history of other diseases of the digestive system: Secondary | ICD-10-CM | POA: Insufficient documentation

## 2012-02-02 DIAGNOSIS — Z79899 Other long term (current) drug therapy: Secondary | ICD-10-CM | POA: Insufficient documentation

## 2012-02-02 DIAGNOSIS — H539 Unspecified visual disturbance: Secondary | ICD-10-CM | POA: Insufficient documentation

## 2012-02-02 DIAGNOSIS — R1032 Left lower quadrant pain: Secondary | ICD-10-CM | POA: Insufficient documentation

## 2012-02-02 DIAGNOSIS — K219 Gastro-esophageal reflux disease without esophagitis: Secondary | ICD-10-CM | POA: Insufficient documentation

## 2012-02-02 DIAGNOSIS — G43909 Migraine, unspecified, not intractable, without status migrainosus: Secondary | ICD-10-CM | POA: Insufficient documentation

## 2012-02-02 DIAGNOSIS — Z8669 Personal history of other diseases of the nervous system and sense organs: Secondary | ICD-10-CM | POA: Insufficient documentation

## 2012-02-02 MED ORDER — OXYCODONE-ACETAMINOPHEN 5-325 MG PO TABS: 1.0000 | ORAL_TABLET | ORAL | Status: AC | PRN

## 2012-02-02 MED ORDER — METHYLPREDNISOLONE SODIUM SUCC 125 MG IJ SOLR
125.0000 mg | Freq: Once | INTRAMUSCULAR | Status: AC
  Administered 2012-02-02: 125 mg via INTRAVENOUS
  Filled 2012-02-02: qty 2

## 2012-02-02 MED ORDER — HYDROMORPHONE HCL PF 1 MG/ML IJ SOLN
1.0000 mg | Freq: Once | INTRAMUSCULAR | Status: AC
  Administered 2012-02-02: 1 mg via INTRAVENOUS
  Filled 2012-02-02: qty 1

## 2012-02-02 MED ORDER — SODIUM CHLORIDE 0.9 % IV BOLUS (SEPSIS)
1000.0000 mL | Freq: Once | INTRAVENOUS | Status: AC
  Administered 2012-02-02: 1000 mL via INTRAVENOUS

## 2012-02-02 MED ORDER — PROMETHAZINE HCL 25 MG/ML IJ SOLN
25.0000 mg | Freq: Once | INTRAMUSCULAR | Status: AC
  Administered 2012-02-02: 25 mg via INTRAVENOUS
  Filled 2012-02-02: qty 1
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
## 2012-02-02 NOTE — ED Provider Notes (Signed)
History     CSN: [id number]  Arrival date & time 02/02/12  1338   First MD Initiated Contact with Patient 02/02/12 1353      Chief Complaint  Patient presents with  . Headache    (Consider location/radiation/quality/duration/timing/severity/associated sxs/prior treatment) HPI  Headache for two days followed by vision loss of left eye which has since resolved.  Patient with history of complex migraines with similar symptoms.  She has had work up for same with carotid US, mri, mra and echo.  Patient followed by neurologist in [place] for same.  She has been taking ibuprofen for this headache.    Past Medical History  Diagnosis Date  . RSD (reflex sympathetic dystrophy)     a. L shoulder  . Migraine   . Colon polyps     a. s/p colonoscopy and polypectomy in 2003  . Abdominal pain, chronic, left lower quadrant     a. s/p Laproscopy in 2004 w/o finding of source of discomfort.  [name] GERD (gastroesophageal reflux disease)   . Hiatal hernia     a. small by EGD 2006.  [name] Visual disturbance     a. 12/2011 Left eye visual changes->w/u for TIA->Carotid U/S:  No ICA stenosis; Head CT: No acute abnl; MRI/MRA: mild RPCA stenosis; Echo: EF 55-60%, Triv AI, No PFO.  . Tobacco abuse     a. 50 pack year hx.  . Cholelithiasis     a. s/p cholecystectomy 10/2011  . Chest pain     a. reportedly nl MV and echo 10/2011 [name]    Past Surgical History  Procedure Date  . Cesarean section   . Shoulder surgery   . Cholecystectomy     a. 10/2011    Family History  Problem Relation Age of Onset  . Dementia Father   . Parkinson's disease Father     alive @ 92  . Heart attack Mother     died @ 15    History  Substance Use Topics  . Smoking status: Current Every Day Smoker -- 2.0 packs/day for 25 years    Types: Cigarettes  . Smokeless tobacco: Not on file     Comment: Smoked 2ppd for roughly 25 yrs but cut back to 1/2 ppd about 6 mos ago.  . Alcohol Use: No    OB History    Grav Para  Term Preterm Abortions TAB SAB Ect Mult Living                  Review of Systems  Eyes: Positive for visual disturbance.  Neurological: Positive for headaches.  All other systems reviewed and are negative.    Allergies  Levaquin  Home Medications   Current Outpatient Rx  Name  Route  Sig  Dispense  Refill  . LISINOPRIL 20 MG PO TABS   Oral   Take 20 mg by mouth daily.         [name] OMEPRAZOLE 20 MG PO CPDR   Oral   Take 20 mg by mouth daily.           BP 163/111  Pulse 114  Temp 97.8 F (36.6 C) (Oral)  Resp 20  Ht 5\' 3"  (1.6 m)  Wt 165 lb (74.844 kg)  BMI 29.23 kg/m2  SpO2 100%  Physical Exam  Nursing note and vitals reviewed. Constitutional: She is oriented to person, place, and time. She appears well-developed and well-nourished.  HENT:  Head: Normocephalic and atraumatic.  Eyes: Conjunctivae normal and  EOM are normal. Pupils are equal, round, and reactive to light.  Neck: Normal range of motion. Neck supple.  Cardiovascular: Normal rate.   Pulmonary/Chest: Effort normal and breath sounds normal.  Abdominal: Soft. Bowel sounds are normal.  Musculoskeletal: Normal range of motion.  Neurological: She is alert and oriented to person, place, and time. She has normal strength and normal reflexes. A sensory deficit is present. She displays a negative Romberg sign. Coordination and gait normal. GCS eye subscore is 4. GCS verbal subscore is 5. GCS motor subscore is 6.  Skin: Skin is warm and dry.    ED Course  Procedures (including critical care time)  Labs Reviewed - No data to display No results found.   No diagnosis found.    MDM  Patient improved after pain meds and has normal eye exam with previous thorough work up as per hpi.  Patient advised follow up with her neurologist tomorrow.          Hilario Quarry, MD 02/02/12 [phone]

## 2012-11-19 DIAGNOSIS — M5417 Radiculopathy, lumbosacral region: Secondary | ICD-10-CM | POA: Insufficient documentation

## 2012-12-14 ENCOUNTER — Encounter (HOSPITAL_BASED_OUTPATIENT_CLINIC_OR_DEPARTMENT_OTHER): Payer: Self-pay | Admitting: Emergency Medicine

## 2012-12-14 ENCOUNTER — Emergency Department (HOSPITAL_BASED_OUTPATIENT_CLINIC_OR_DEPARTMENT_OTHER)
Admission: EM | Admit: 2012-12-14 | Discharge: 2012-12-15 | Disposition: A | Discharge status: Home / Self Care | Payer: Medicare Other | Attending: Emergency Medicine | Admitting: Emergency Medicine

## 2012-12-14 DIAGNOSIS — G43109 Migraine with aura, not intractable, without status migrainosus: Secondary | ICD-10-CM | POA: Insufficient documentation

## 2012-12-14 DIAGNOSIS — Z79899 Other long term (current) drug therapy: Secondary | ICD-10-CM | POA: Insufficient documentation

## 2012-12-14 DIAGNOSIS — K219 Gastro-esophageal reflux disease without esophagitis: Secondary | ICD-10-CM | POA: Insufficient documentation

## 2012-12-14 DIAGNOSIS — Z8669 Personal history of other diseases of the nervous system and sense organs: Secondary | ICD-10-CM | POA: Insufficient documentation

## 2012-12-14 DIAGNOSIS — F17200 Nicotine dependence, unspecified, uncomplicated: Secondary | ICD-10-CM | POA: Insufficient documentation

## 2012-12-14 DIAGNOSIS — Z860100 Personal history of colon polyps, unspecified: Secondary | ICD-10-CM | POA: Insufficient documentation

## 2012-12-14 DIAGNOSIS — G8929 Other chronic pain: Secondary | ICD-10-CM | POA: Insufficient documentation

## 2012-12-14 DIAGNOSIS — R0789 Other chest pain: Secondary | ICD-10-CM | POA: Insufficient documentation

## 2012-12-14 MED ORDER — DIPHENHYDRAMINE HCL 50 MG/ML IJ SOLN
25.0000 mg | Freq: Once | INTRAMUSCULAR | Status: AC
  Administered 2012-12-14: 25 mg via INTRAVENOUS
  Filled 2012-12-14: qty 1

## 2012-12-14 MED ORDER — KETOROLAC TROMETHAMINE 30 MG/ML IJ SOLN
30.0000 mg | Freq: Once | INTRAMUSCULAR | Status: AC
  Administered 2012-12-14: 30 mg via INTRAVENOUS
  Filled 2012-12-14: qty 1

## 2012-12-14 MED ORDER — PROMETHAZINE HCL 25 MG/ML IJ SOLN
25.0000 mg | Freq: Once | INTRAMUSCULAR | Status: AC
  Administered 2012-12-14: 25 mg via INTRAVENOUS
  Filled 2012-12-14: qty 1

## 2012-12-14 MED ORDER — SODIUM CHLORIDE 0.9 % IV BOLUS (SEPSIS)
500.0000 mL | Freq: Once | INTRAVENOUS | Status: AC
  Administered 2012-12-14: 500 mL via INTRAVENOUS

## 2012-12-14 NOTE — ED Provider Notes (Signed)
CSN: [id number]     Arrival date & time 12/14/12  2133 History  This chart was scribed for [employer]. Rubin Payor, MD by Blanchard Kelch, ED Scribe. The patient was seen in room MH07/MH07. Patient's care was started at 10:27 PM.      Chief Complaint  Patient presents with  . Headache    Patient is a 47 y.o. female presenting with headaches. The history is provided by the patient. No language interpreter was used.  Headache Associated symptoms: numbness and vomiting   Associated symptoms: no diarrhea and no fever     HPI Comments: Dana Morrow is a 47 y.o. female who presents to the Emergency Department complaining of a constant, waxing and waning headache for the entire day. She describes the pain as stabbing. She had an episode with the headache where her left eye went completely blind but she has since regained vision. The vision is still fuzzy in the eye and she is currently seeing white spots. She also reports numbness on the left side of her face and two episodes of vomiting prior to arrival. She was seen here a year ago for similar symptoms (except face numbness) and was diagnosed with a complicated migraine. She denies a history of eye problems or headaches other than the one a year ago. She denies diarrhea, fever, chills or chest pain. She denies sick contacts.   Past Medical History  Diagnosis Date  . RSD (reflex sympathetic dystrophy)     a. L shoulder  . Migraine   . Colon polyps     a. s/p colonoscopy and polypectomy in 2003  . Abdominal pain, chronic, left lower quadrant     a. s/p Laproscopy in 2004 w/o finding of source of discomfort.  [name] GERD (gastroesophageal reflux disease)   . Hiatal hernia     a. small by EGD 2006.  [name] Visual disturbance     a. 12/2011 Left eye visual changes->w/u for TIA->Carotid U/S:  No ICA stenosis; Head CT: No acute abnl; MRI/MRA: mild RPCA stenosis; Echo: EF 55-60%, Triv AI, No PFO.  . Tobacco abuse     a. 50 pack year hx.  . Cholelithiasis    a. s/p cholecystectomy 10/2011  . Chest pain     a. reportedly nl MV and echo 10/2011 [name]   Past Surgical History  Procedure Laterality Date  . Cesarean section    . Shoulder surgery    . Cholecystectomy      a. 10/2011   Family History  Problem Relation Age of Onset  . Dementia Father   . Parkinson's disease Father     alive @ 103  . Heart attack Mother     died @ 66   History  Substance Use Topics  . Smoking status: Current Every Day Smoker -- 0.50 packs/day for 25 years    Types: Cigarettes  . Smokeless tobacco: Not on file     Comment: Smoked 2ppd for roughly 25 yrs but cut back to 1/2 ppd about 6 mos ago.  . Alcohol Use: No   OB History   Grav Para Term Preterm Abortions TAB SAB Ect Mult Living                 Review of Systems  Constitutional: Negative for fever and chills.  Eyes: Positive for visual disturbance.  Respiratory: Positive for chest tightness.   Cardiovascular: Negative for chest pain.  Gastrointestinal: Positive for vomiting. Negative for diarrhea.  Neurological: Positive for numbness  and headaches.  All other systems reviewed and are negative.    Allergies  Levaquin  Home Medications   Current Outpatient Rx  Name  Route  Sig  Dispense  Refill  . lisinopril (PRINIVIL,ZESTRIL) 20 MG tablet   Oral   Take 20 mg by mouth daily.         . Metoprolol-Hydrochlorothiazide (METOPROLOL-HCTZ ER PO)   Oral   Take by mouth.         [name] omeprazole (PRILOSEC) 20 MG capsule   Oral   Take 20 mg by mouth daily.          Triage Vitals: BP 141/128  Pulse 120  Temp(Src) 98.2 F (36.8 C) (Oral)  Resp 20  Ht 5\' 3"  (1.6 m)  Wt 164 lb (74.39 kg)  BMI 29.06 kg/m2  SpO2 100%  Physical Exam  Nursing note and vitals reviewed. Constitutional: She is oriented to person, place, and time. She appears well-developed and well-nourished.  HENT:  Head: Normocephalic and atraumatic.  Eyes: EOM are normal.  Left eye lateral field blurred  vision.  Cardiovascular: Normal rate and regular rhythm.   Pulmonary/Chest: Effort normal. No respiratory distress. She has no wheezes. She has no rales. She exhibits no tenderness.  Abdominal: Soft. There is no tenderness.  Musculoskeletal:  Moving all extremities.  Neurological: She is alert and oriented to person, place, and time.  Face symmetric. Paresthesias to left face.  Skin: Skin is warm and dry.    ED Course  Procedures (including critical care time)    COORDINATION OF CARE: 10:27 PM -Will review note from last visit to determine treatment plan. Patient verbalizes understanding and agrees with treatment plan.    Labs Review Labs Reviewed - No data to display Imaging Review No results found.  EKG Interpretation   None       MDM  No diagnosis found. Patient with headache and visual changes and facial numbness. History of same with extensive workup and the past. Had chest pain at that time 2. We'll treat as complicated migraine.  I personally performed the services described in this documentation, which was scribed in my presence. The recorded information has been reviewed and is accurate.     Juliet Rude. Rubin Payor, MD 12/14/12 2328

## 2012-12-14 NOTE — ED Notes (Signed)
Headache and blurred vision sudden onset tonight. Vomited x 2. The left side of her face is numb.

## 2012-12-15 MED ORDER — DEXAMETHASONE SODIUM PHOSPHATE 10 MG/ML IJ SOLN
4.0000 mg | Freq: Once | INTRAMUSCULAR | Status: AC
  Administered 2012-12-15: 4 mg via INTRAVENOUS
  Filled 2012-12-15: qty 1

## 2012-12-15 MED ORDER — METOCLOPRAMIDE HCL 5 MG/ML IJ SOLN
10.0000 mg | Freq: Once | INTRAMUSCULAR | Status: AC
  Administered 2012-12-15: 10 mg via INTRAVENOUS
  Filled 2012-12-15: qty 2

## 2012-12-15 MED ORDER — DIPHENHYDRAMINE HCL 50 MG/ML IJ SOLN
25.0000 mg | Freq: Once | INTRAMUSCULAR | Status: AC
  Administered 2012-12-15: 25 mg via INTRAVENOUS
  Filled 2012-12-15: qty 1

## 2012-12-15 MED ORDER — DIVALPROEX SODIUM 250 MG PO DR TAB
500.0000 mg | DELAYED_RELEASE_TABLET | Freq: Two times a day (BID) | ORAL | Status: DC
  Administered 2012-12-15: 500 mg via ORAL
  Filled 2012-12-15 (×2): qty 2

## 2012-12-15 MED ORDER — SODIUM CHLORIDE 0.9 % IV BOLUS (SEPSIS)
500.0000 mL | Freq: Once | INTRAVENOUS | Status: AC
  Administered 2012-12-15: 500 mL via INTRAVENOUS

## 2013-01-11 DIAGNOSIS — R109 Unspecified abdominal pain: Secondary | ICD-10-CM | POA: Diagnosis not present

## 2013-01-11 DIAGNOSIS — Z888 Allergy status to other drugs, medicaments and biological substances status: Secondary | ICD-10-CM | POA: Diagnosis not present

## 2013-01-11 DIAGNOSIS — F17200 Nicotine dependence, unspecified, uncomplicated: Secondary | ICD-10-CM | POA: Diagnosis not present

## 2013-01-11 DIAGNOSIS — R0789 Other chest pain: Secondary | ICD-10-CM | POA: Diagnosis not present

## 2013-01-11 DIAGNOSIS — R079 Chest pain, unspecified: Secondary | ICD-10-CM | POA: Diagnosis not present

## 2013-01-12 DIAGNOSIS — R079 Chest pain, unspecified: Secondary | ICD-10-CM | POA: Diagnosis not present

## 2013-01-15 DIAGNOSIS — Z5181 Encounter for therapeutic drug level monitoring: Secondary | ICD-10-CM | POA: Diagnosis not present

## 2013-01-15 DIAGNOSIS — G90519 Complex regional pain syndrome I of unspecified upper limb: Secondary | ICD-10-CM | POA: Diagnosis not present

## 2013-01-15 DIAGNOSIS — G894 Chronic pain syndrome: Secondary | ICD-10-CM | POA: Diagnosis not present

## 2013-01-15 DIAGNOSIS — R109 Unspecified abdominal pain: Secondary | ICD-10-CM | POA: Diagnosis not present

## 2013-01-16 DIAGNOSIS — G90519 Complex regional pain syndrome I of unspecified upper limb: Secondary | ICD-10-CM | POA: Diagnosis present

## 2013-01-21 DIAGNOSIS — E785 Hyperlipidemia, unspecified: Secondary | ICD-10-CM | POA: Diagnosis not present

## 2013-01-21 DIAGNOSIS — Z8711 Personal history of peptic ulcer disease: Secondary | ICD-10-CM | POA: Diagnosis not present

## 2013-01-21 DIAGNOSIS — R109 Unspecified abdominal pain: Secondary | ICD-10-CM | POA: Diagnosis not present

## 2013-01-21 DIAGNOSIS — F17200 Nicotine dependence, unspecified, uncomplicated: Secondary | ICD-10-CM | POA: Diagnosis not present

## 2013-01-21 DIAGNOSIS — Z79899 Other long term (current) drug therapy: Secondary | ICD-10-CM | POA: Diagnosis not present

## 2013-01-21 DIAGNOSIS — G90519 Complex regional pain syndrome I of unspecified upper limb: Secondary | ICD-10-CM | POA: Diagnosis not present

## 2013-01-21 DIAGNOSIS — G894 Chronic pain syndrome: Secondary | ICD-10-CM | POA: Diagnosis not present

## 2013-01-21 DIAGNOSIS — I10 Essential (primary) hypertension: Secondary | ICD-10-CM | POA: Diagnosis not present

## 2013-01-21 DIAGNOSIS — Z8669 Personal history of other diseases of the nervous system and sense organs: Secondary | ICD-10-CM | POA: Diagnosis not present

## 2013-01-21 DIAGNOSIS — Z8719 Personal history of other diseases of the digestive system: Secondary | ICD-10-CM | POA: Diagnosis not present

## 2013-01-26 DIAGNOSIS — D539 Nutritional anemia, unspecified: Secondary | ICD-10-CM | POA: Diagnosis not present

## 2013-01-26 DIAGNOSIS — Z0000 Encounter for general adult medical examination without abnormal findings: Secondary | ICD-10-CM | POA: Diagnosis not present

## 2013-01-26 DIAGNOSIS — E559 Vitamin D deficiency, unspecified: Secondary | ICD-10-CM | POA: Diagnosis not present

## 2013-01-26 DIAGNOSIS — M545 Low back pain, unspecified: Secondary | ICD-10-CM | POA: Diagnosis not present

## 2013-01-26 DIAGNOSIS — IMO0002 Reserved for concepts with insufficient information to code with codable children: Secondary | ICD-10-CM | POA: Diagnosis not present

## 2013-01-26 DIAGNOSIS — Z1331 Encounter for screening for depression: Secondary | ICD-10-CM | POA: Diagnosis not present

## 2013-01-26 DIAGNOSIS — E782 Mixed hyperlipidemia: Secondary | ICD-10-CM | POA: Diagnosis not present

## 2013-01-26 DIAGNOSIS — M129 Arthropathy, unspecified: Secondary | ICD-10-CM | POA: Diagnosis not present

## 2013-01-26 DIAGNOSIS — M810 Age-related osteoporosis without current pathological fracture: Secondary | ICD-10-CM | POA: Diagnosis not present

## 2013-01-26 DIAGNOSIS — I10 Essential (primary) hypertension: Secondary | ICD-10-CM | POA: Diagnosis not present

## 2013-01-26 DIAGNOSIS — R0602 Shortness of breath: Secondary | ICD-10-CM | POA: Diagnosis not present

## 2013-01-26 DIAGNOSIS — Z1339 Encounter for screening examination for other mental health and behavioral disorders: Secondary | ICD-10-CM | POA: Diagnosis not present

## 2013-01-26 DIAGNOSIS — D649 Anemia, unspecified: Secondary | ICD-10-CM | POA: Diagnosis not present

## 2013-01-26 DIAGNOSIS — F17200 Nicotine dependence, unspecified, uncomplicated: Secondary | ICD-10-CM | POA: Diagnosis not present

## 2013-01-28 ENCOUNTER — Emergency Department (HOSPITAL_BASED_OUTPATIENT_CLINIC_OR_DEPARTMENT_OTHER): Payer: Medicare Other

## 2013-01-28 ENCOUNTER — Emergency Department (HOSPITAL_BASED_OUTPATIENT_CLINIC_OR_DEPARTMENT_OTHER)
Admission: EM | Admit: 2013-01-28 | Discharge: 2013-01-28 | Disposition: A | Discharge status: Home / Self Care | Payer: Medicare Other | Attending: Emergency Medicine | Admitting: Emergency Medicine

## 2013-01-28 ENCOUNTER — Encounter (HOSPITAL_BASED_OUTPATIENT_CLINIC_OR_DEPARTMENT_OTHER): Payer: Self-pay | Admitting: Emergency Medicine

## 2013-01-28 DIAGNOSIS — F17200 Nicotine dependence, unspecified, uncomplicated: Secondary | ICD-10-CM | POA: Insufficient documentation

## 2013-01-28 DIAGNOSIS — Z9089 Acquired absence of other organs: Secondary | ICD-10-CM | POA: Insufficient documentation

## 2013-01-28 DIAGNOSIS — Z8679 Personal history of other diseases of the circulatory system: Secondary | ICD-10-CM | POA: Insufficient documentation

## 2013-01-28 DIAGNOSIS — Z8669 Personal history of other diseases of the nervous system and sense organs: Secondary | ICD-10-CM | POA: Diagnosis not present

## 2013-01-28 DIAGNOSIS — Z9889 Other specified postprocedural states: Secondary | ICD-10-CM | POA: Diagnosis not present

## 2013-01-28 DIAGNOSIS — R1032 Left lower quadrant pain: Secondary | ICD-10-CM | POA: Diagnosis not present

## 2013-01-28 DIAGNOSIS — R509 Fever, unspecified: Secondary | ICD-10-CM | POA: Insufficient documentation

## 2013-01-28 DIAGNOSIS — Z860100 Personal history of colon polyps, unspecified: Secondary | ICD-10-CM | POA: Insufficient documentation

## 2013-01-28 DIAGNOSIS — G8929 Other chronic pain: Secondary | ICD-10-CM | POA: Diagnosis not present

## 2013-01-28 DIAGNOSIS — N23 Unspecified renal colic: Secondary | ICD-10-CM | POA: Insufficient documentation

## 2013-01-28 DIAGNOSIS — K219 Gastro-esophageal reflux disease without esophagitis: Secondary | ICD-10-CM | POA: Insufficient documentation

## 2013-01-28 DIAGNOSIS — R110 Nausea: Secondary | ICD-10-CM | POA: Insufficient documentation

## 2013-01-28 DIAGNOSIS — Z79899 Other long term (current) drug therapy: Secondary | ICD-10-CM | POA: Diagnosis not present

## 2013-01-28 LAB — CBC WITH DIFFERENTIAL/PLATELET
Basophils Absolute: 0 10*3/uL (ref 0.0–0.1)
Basophils Relative: 0 % (ref 0–1)
EOS ABS: 0.4 10*3/uL (ref 0.0–0.7)
Eosinophils Relative: 2 % (ref 0–5)
HEMATOCRIT: 42.3 % (ref 36.0–46.0)
Hemoglobin: 14.4 g/dL (ref 12.0–15.0)
LYMPHS ABS: 4.3 10*3/uL — AB (ref 0.7–4.0)
Lymphocytes Relative: 22 % (ref 12–46)
MCH: 32.1 pg (ref 26.0–34.0)
MCHC: 34 g/dL (ref 30.0–36.0)
MCV: 94.4 fL (ref 78.0–100.0)
Monocytes Absolute: 1.4 10*3/uL — ABNORMAL HIGH (ref 0.1–1.0)
Monocytes Relative: 7 % (ref 3–12)
NEUTROS ABS: 13.3 10*3/uL — AB (ref 1.7–7.7)
Neutrophils Relative %: 69 % (ref 43–77)
Platelets: 274 10*3/uL (ref 150–400)
RBC: 4.48 MIL/uL (ref 3.87–5.11)
RDW: 13.4 % (ref 11.5–15.5)
WBC: 19.4 10*3/uL — ABNORMAL HIGH (ref 4.0–10.5)

## 2013-01-28 LAB — BASIC METABOLIC PANEL
BUN: 22 mg/dL (ref 6–23)
CALCIUM: 9 mg/dL (ref 8.4–10.5)
CO2: 25 meq/L (ref 19–32)
Chloride: 101 mEq/L (ref 96–112)
Creatinine, Ser: 0.8 mg/dL (ref 0.50–1.10)
GFR calc non Af Amer: 85 mL/min — ABNORMAL LOW (ref >90)
Glucose, Bld: 109 mg/dL — ABNORMAL HIGH (ref 70–99)
Potassium: 4.2 mEq/L (ref 3.7–5.3)
Sodium: 140 mEq/L (ref 137–147)

## 2013-01-28 LAB — URINALYSIS, ROUTINE W REFLEX MICROSCOPIC
Bilirubin Urine: NEGATIVE
GLUCOSE, UA: NEGATIVE mg/dL
Hgb urine dipstick: NEGATIVE
Ketones, ur: NEGATIVE mg/dL
LEUKOCYTES UA: NEGATIVE
Nitrite: NEGATIVE
Protein, ur: NEGATIVE mg/dL
Specific Gravity, Urine: 1.026 (ref 1.005–1.030)
Urobilinogen, UA: 0.2 mg/dL (ref 0.0–1.0)
pH: 5.5 (ref 5.0–8.0)

## 2013-01-28 MED ORDER — DIAZEPAM 5 MG PO TABS
5.0000 mg | ORAL_TABLET | Freq: Once | ORAL | Status: AC
  Administered 2013-01-28: 5 mg via ORAL
  Filled 2013-01-28: qty 1

## 2013-01-28 MED ORDER — OXYCODONE-ACETAMINOPHEN 5-325 MG PO TABS
2.0000 | ORAL_TABLET | Freq: Once | ORAL | Status: AC
  Administered 2013-01-28: 2 via ORAL
  Filled 2013-01-28: qty 2

## 2013-01-28 MED ORDER — ONDANSETRON HCL 4 MG/2ML IJ SOLN
4.0000 mg | Freq: Once | INTRAMUSCULAR | Status: AC
  Administered 2013-01-28: 4 mg via INTRAVENOUS
  Filled 2013-01-28: qty 2

## 2013-01-28 MED ORDER — HYDROMORPHONE HCL PF 1 MG/ML IJ SOLN
1.0000 mg | Freq: Once | INTRAMUSCULAR | Status: AC
  Administered 2013-01-28: 1 mg via INTRAVENOUS
  Filled 2013-01-28: qty 1

## 2013-01-28 MED ORDER — IOHEXOL 300 MG/ML  SOLN
50.0000 mL | Freq: Once | INTRAMUSCULAR | Status: AC | PRN
  Administered 2013-01-28: 50 mL via ORAL

## 2013-01-28 MED ORDER — SODIUM CHLORIDE 0.9 % IV BOLUS (SEPSIS)
1000.0000 mL | Freq: Once | INTRAVENOUS | Status: AC
  Administered 2013-01-28: 1000 mL via INTRAVENOUS

## 2013-01-28 MED ORDER — IOHEXOL 300 MG/ML  SOLN
100.0000 mL | Freq: Once | INTRAMUSCULAR | Status: AC | PRN
  Administered 2013-01-28: 100 mL via INTRAVENOUS

## 2013-01-28 MED ORDER — KETOROLAC TROMETHAMINE 60 MG/2ML IM SOLN
60.0000 mg | Freq: Once | INTRAMUSCULAR | Status: AC
  Administered 2013-01-28: 60 mg via INTRAMUSCULAR
  Filled 2013-01-28: qty 2

## 2013-01-28 MED ORDER — OXYCODONE-ACETAMINOPHEN 5-325 MG PO TABS: 1.0000 | ORAL_TABLET | Freq: Four times a day (QID) | ORAL | Status: DC | PRN

## 2013-01-28 MED ORDER — MORPHINE SULFATE 4 MG/ML IJ SOLN
4.0000 mg | Freq: Once | INTRAMUSCULAR | Status: AC
  Administered 2013-01-28: 4 mg via INTRAVENOUS
  Filled 2013-01-28: qty 1

## 2013-01-28 NOTE — ED Provider Notes (Signed)
CSN: [id number]     Arrival date & time 01/28/13  1930 History   First MD Initiated Contact with Patient 01/28/13 2004     Chief Complaint  Patient presents with  . Back Pain   (Consider location/radiation/quality/duration/timing/severity/associated sxs/prior Treatment) HPI Comments: Patient is a 48 year old female with a past medical history of diverticulitis who presents with abdominal pain for the past 2 days. The pain is located in her LLQ and radiates to her back. The pain is described as sharp and severe. The pain started gradually and progressively worsened since the onset. No alleviating/aggravating factors. The patient has tried nothing for symptoms without relief. Associated symptoms include nausea and fever. Patient denies headache, vomiting, diarrhea, chest pain, SOB, dysuria, constipation, abnormal vaginal bleeding/discharge.     Patient is a 48 y.o. female presenting with back pain.  Back Pain Associated symptoms: abdominal pain and fever   Associated symptoms: no chest pain, no dysuria and no weakness     Past Medical History  Diagnosis Date  . RSD (reflex sympathetic dystrophy)     a. L shoulder  . Migraine   . Colon polyps     a. s/p colonoscopy and polypectomy in 2003  . Abdominal pain, chronic, left lower quadrant     a. s/p Laproscopy in 2004 w/o finding of source of discomfort.  [name] GERD (gastroesophageal reflux disease)   . Hiatal hernia     a. small by EGD 2006.  [name] Visual disturbance     a. 12/2011 Left eye visual changes->w/u for TIA->Carotid U/S:  No ICA stenosis; Head CT: No acute abnl; MRI/MRA: mild RPCA stenosis; Echo: EF 55-60%, Triv AI, No PFO.  . Tobacco abuse     a. 50 pack year hx.  . Cholelithiasis     a. s/p cholecystectomy 10/2011  . Chest pain     a. reportedly nl MV and echo 10/2011 - [place]  . Diverticulitis    Past Surgical History  Procedure Laterality Date  . Cesarean section    . Shoulder surgery    . Cholecystectomy      a.  10/2011  . Hernia repair     Family History  Problem Relation Age of Onset  . Dementia Father   . Parkinson's disease Father     alive @ 83  . Heart attack Mother     died @ 5   History  Substance Use Topics  . Smoking status: Current Every Day Smoker -- 0.50 packs/day for 25 years    Types: Cigarettes  . Smokeless tobacco: Not on file     Comment: Smoked 2ppd for roughly 25 yrs but cut back to 1/2 ppd about 6 mos ago.  . Alcohol Use: No   OB History   Grav Para Term Preterm Abortions TAB SAB Ect Mult Living                 Review of Systems  Constitutional: Positive for fever. Negative for chills and fatigue.  HENT: Negative for trouble swallowing.   Eyes: Negative for visual disturbance.  Respiratory: Negative for shortness of breath.   Cardiovascular: Negative for chest pain and palpitations.  Gastrointestinal: Positive for nausea and abdominal pain. Negative for vomiting and diarrhea.  Genitourinary: Negative for dysuria and difficulty urinating.  Musculoskeletal: Positive for back pain. Negative for arthralgias and neck pain.  Skin: Negative for color change.  Neurological: Negative for dizziness and weakness.  Psychiatric/Behavioral: Negative for dysphoric mood.    Allergies  Levaquin  Home Medications   Current Outpatient Rx  Name  Route  Sig  Dispense  Refill  . metoprolol (LOPRESSOR) 100 MG tablet   Oral   Take 100 mg by mouth daily.         [name]. lisinopril (PRINIVIL,ZESTRIL) 20 MG tablet   Oral   Take 20 mg by mouth daily.         . Metoprolol-Hydrochlorothiazide (METOPROLOL-HCTZ ER PO)   Oral   Take by mouth.         [name]. omeprazole (PRILOSEC) 20 MG capsule   Oral   Take 20 mg by mouth daily.          BP 178/105  Pulse 93  Temp(Src) 99.2 F (37.3 C) (Oral)  Resp 20  Ht 5\' 3"  (1.6 m)  Wt 187 lb (84.823 kg)  BMI 33.13 kg/m2  SpO2 99% Physical Exam  Nursing note and vitals reviewed. Constitutional: She is oriented to person, place,  and time. She appears well-developed and well-nourished. No distress.  HENT:  Head: Normocephalic and atraumatic.  Eyes: Conjunctivae and EOM are normal.  Neck: Normal range of motion.  Cardiovascular: Normal rate and regular rhythm.  Exam reveals no gallop and no friction rub.   No murmur heard. Pulmonary/Chest: Effort normal and breath sounds normal. She has no wheezes. She has no rales. She exhibits no tenderness.  Abdominal: Soft. She exhibits no distension. There is tenderness. There is no rebound and no guarding.  LLQ tenderness to palpation. No other focal tenderness or peritoneal signs.   Musculoskeletal: Normal range of motion.  No midline spine tenderness to palpation. No paraspinal tenderness to palpation.   Neurological: She is alert and oriented to person, place, and time. Coordination normal.  Speech is goal-oriented. Moves limbs without ataxia.   Skin: Skin is warm and dry.  Psychiatric: She has a normal mood and affect. Her behavior is normal.    ED Course  Procedures (including critical care time) Labs Review Labs Reviewed  CBC WITH DIFFERENTIAL - Abnormal; Notable for the following:    WBC 19.4 (*)    Neutro Abs 13.3 (*)    Lymphs Abs 4.3 (*)    Monocytes Absolute 1.4 (*)    All other components within normal limits  BASIC METABOLIC PANEL - Abnormal; Notable for the following:    Glucose, Bld 109 (*)    GFR calc non Af Amer 85 (*)    All other components within normal limits  URINE CULTURE  URINALYSIS, ROUTINE W REFLEX MICROSCOPIC   Imaging Review No results found.  EKG Interpretation   None       MDM   1. Renal colic on left side     8:48 PM Labs and urinalysis pending. Patient will have CT abdomen and pelvis to rule out diverticulitis. Patient will have fluids, morphine and zofran for symptoms. Vitals stable and patient afebrile.   9:57 PM Patient has elevated WBC at 19.4. CT abdomen pelvis pending. Patient signed out to Dr. Anitra LauthPlunkett pending  disposition.   Emilia BeckKaitlyn Atsushi Yom, [place] JerseyPA-C 01/31/13 [phone]

## 2013-01-28 NOTE — Discharge Instructions (Signed)
Pain of Unknown Etiology (Pain Without a Known Cause) You have come to your caregiver because of pain. Pain can occur in any part of the body. Often there is not a definite cause. If your laboratory (blood or urine) work was normal and X-rays or other studies were normal, your caregiver may treat you without knowing the cause of the pain. An example of this is the headache. Most headaches are diagnosed by taking a history. This means your caregiver asks you questions about your headaches. Your caregiver determines a treatment based on your answers. Usually testing done for headaches is normal. Often testing is not done unless there is no response to medications. Regardless of where your pain is located today, you can be given medications to make you comfortable. If no physical cause of pain can be found, most cases of pain will gradually leave as suddenly as they came.  If you have a painful condition and no reason can be found for the pain, it is important that you follow up with your caregiver. If the pain becomes worse or does not go away, it may be necessary to repeat tests and look further for a possible cause.  Only take over-the-counter or prescription medicines for pain, discomfort, or fever as directed by your caregiver.  For the protection of your privacy, test results cannot be given over the phone. Make sure you receive the results of your test. Ask how these results are to be obtained if you have not been informed. It is your responsibility to obtain your test results.  You may continue all activities unless the activities cause more pain. When the pain lessens, it is important to gradually resume normal activities. Resume activities by beginning slowly and gradually increasing the intensity and duration of the activities or exercise. During periods of severe pain, bed rest may be helpful. Lie or sit in any position that is comfortable.  Ice used for acute (sudden) conditions may be effective.  Use a large plastic bag filled with ice and wrapped in a towel. This may provide pain relief.  See your caregiver for continued problems. Your caregiver can help or refer you for exercises or physical therapy if necessary. If you were given medications for your condition, do not drive, operate machinery or power tools, or sign legal documents for 24 hours. Do not drink alcohol, take sleeping pills, or take other medications that may interfere with treatment. See your caregiver immediately if you have pain that is becoming worse and not relieved by medications. Document Released: 09/18/2000 Document Revised: 10/14/2012 Document Reviewed: 12/24/2004 [facility] Patient Information 2014 [place], [place].  Kidney Stones Kidney stones (urolithiasis) are deposits that form inside your kidneys. The intense pain is caused by the stone moving through the urinary tract. When the stone moves, the ureter goes into spasm around the stone. The stone is usually passed in the urine.  CAUSES   A disorder that makes certain neck glands produce too much parathyroid hormone (primary hyperparathyroidism).  A buildup of uric acid crystals, similar to gout in your joints.  Narrowing (stricture) of the ureter.  A kidney obstruction present at birth (congenital obstruction).  Previous surgery on the kidney or ureters.  Numerous kidney infections. SYMPTOMS   Feeling sick to your stomach (nauseous).  Throwing up (vomiting).  Blood in the urine (hematuria).  Pain that usually spreads (radiates) to the groin.  Frequency or urgency of urination. DIAGNOSIS   Taking a history and physical exam.  Blood or urine tests.  CT scan.  Occasionally, an examination of the inside of the urinary bladder (cystoscopy) is performed. TREATMENT   Observation.  Increasing your fluid intake.  Extracorporeal shock wave lithotripsy This is a noninvasive procedure that uses shock waves to break up kidney stones.  Surgery  may be needed if you have severe pain or persistent obstruction. There are various surgical procedures. Most of the procedures are performed with the use of small instruments. Only small incisions are needed to accommodate these instruments, so recovery time is minimized. The size, location, and chemical composition are all important variables that will determine the proper choice of action for you. Talk to your health care provider to better understand your situation so that you will minimize the risk of injury to yourself and your kidney.  HOME CARE INSTRUCTIONS   Drink enough water and fluids to keep your urine clear or pale yellow. This will help you to pass the stone or stone fragments.  Strain all urine through the provided strainer. Keep all particulate matter and stones for your health care provider to see. The stone causing the pain may be as small as a grain of salt. It is very important to use the strainer each and every time you pass your urine. The collection of your stone will allow your health care provider to analyze it and verify that a stone has actually passed. The stone analysis will often identify what you can do to reduce the incidence of recurrences.  Only take over-the-counter or prescription medicines for pain, discomfort, or fever as directed by your health care provider.  Make a follow-up appointment with your health care provider as directed.  Get follow-up X-rays if required. The absence of pain does not always mean that the stone has passed. It may have only stopped moving. If the urine remains completely obstructed, it can cause loss of kidney function or even complete destruction of the kidney. It is your responsibility to make sure X-rays and follow-ups are completed. Ultrasounds of the kidney can show blockages and the status of the kidney. Ultrasounds are not associated with any radiation and can be performed easily in a matter of minutes. SEEK MEDICAL CARE IF:  You  experience pain that is progressive and unresponsive to any pain medicine you have been prescribed. SEEK IMMEDIATE MEDICAL CARE IF:   Pain cannot be controlled with the prescribed medicine.  You have a fever or shaking chills.  The severity or intensity of pain increases over 18 hours and is not relieved by pain medicine.  You develop a new onset of abdominal pain.  You feel faint or pass out.  You are unable to urinate. MAKE SURE YOU:   Understand these instructions.  Will watch your condition.  Will get help right away if you are not doing well or get worse. Document Released: 12/24/2004 Document Revised: 08/26/2012 Document Reviewed: 05/27/2012 [facility] Patient Information 2014 [place].

## 2013-01-28 NOTE — ED Provider Notes (Addendum)
Pt's ct neg for acute process.  Pt's sx sound classic for renal colic.  No signs of infection or visualized stones but sounds like stones.  Will treat pain and d/c home.  Gwyneth SproutWhitney Teofila Bowery, MD 01/28/13 [id number]  Gwyneth SproutWhitney Peyton Spengler, MD 01/28/13 2326

## 2013-01-28 NOTE — ED Notes (Signed)
Back and LLQ pain x3-4 weeks.  Had epidural 2 wks ago that provided some relief for a week.  Is getting more severe again now.

## 2013-01-29 LAB — URINE CULTURE
Colony Count: NO GROWTH
Culture: NO GROWTH

## 2013-01-31 NOTE — ED Provider Notes (Signed)
Medical screening examination/treatment/procedure(s) were conducted as a shared visit with non-physician practitioner(s) and myself.  I personally evaluated the patient during the encounter.  EKG Interpretation   None         Gwyneth SproutWhitney Ziaire Hagos, MD 01/31/13 2143

## 2013-02-01 ENCOUNTER — Encounter (HOSPITAL_COMMUNITY): Payer: Self-pay | Admitting: Emergency Medicine

## 2013-02-01 ENCOUNTER — Emergency Department (HOSPITAL_COMMUNITY)
Admission: EM | Admit: 2013-02-01 | Discharge: 2013-02-01 | Discharge status: Against Medical Advice | Payer: Medicare Other | Attending: Emergency Medicine | Admitting: Emergency Medicine

## 2013-02-01 DIAGNOSIS — R109 Unspecified abdominal pain: Secondary | ICD-10-CM | POA: Diagnosis not present

## 2013-02-01 DIAGNOSIS — F17200 Nicotine dependence, unspecified, uncomplicated: Secondary | ICD-10-CM | POA: Diagnosis not present

## 2013-02-01 LAB — COMPREHENSIVE METABOLIC PANEL
ALK PHOS: 82 U/L (ref 39–117)
ALT: 100 U/L — AB (ref 0–35)
AST: 14 U/L (ref 0–37)
Albumin: 3.8 g/dL (ref 3.5–5.2)
BUN: 13 mg/dL (ref 6–23)
CHLORIDE: 101 meq/L (ref 96–112)
CO2: 25 meq/L (ref 19–32)
Calcium: 9.3 mg/dL (ref 8.4–10.5)
Creatinine, Ser: 0.78 mg/dL (ref 0.50–1.10)
GFR calc non Af Amer: >90 mL/min (ref >90)
GLUCOSE: 103 mg/dL — AB (ref 70–99)
POTASSIUM: 4.1 meq/L (ref 3.7–5.3)
SODIUM: 138 meq/L (ref 137–147)
Total Bilirubin: <0.2 mg/dL — ABNORMAL LOW (ref 0.3–1.2)
Total Protein: 7.7 g/dL (ref 6.0–8.3)

## 2013-02-01 LAB — CBC WITH DIFFERENTIAL/PLATELET
Basophils Absolute: 0.1 10*3/uL (ref 0.0–0.1)
Basophils Relative: 1 % (ref 0–1)
Eosinophils Absolute: 0.2 10*3/uL (ref 0.0–0.7)
Eosinophils Relative: 1 % (ref 0–5)
HCT: 41.9 % (ref 36.0–46.0)
Hemoglobin: 14.4 g/dL (ref 12.0–15.0)
LYMPHS ABS: 3.7 10*3/uL (ref 0.7–4.0)
Lymphocytes Relative: 26 % (ref 12–46)
MCH: 32 pg (ref 26.0–34.0)
MCHC: 34.4 g/dL (ref 30.0–36.0)
MCV: 93.1 fL (ref 78.0–100.0)
Monocytes Absolute: 0.8 10*3/uL (ref 0.1–1.0)
Monocytes Relative: 6 % (ref 3–12)
NEUTROS ABS: 9.8 10*3/uL — AB (ref 1.7–7.7)
NEUTROS PCT: 67 % (ref 43–77)
Platelets: 259 10*3/uL (ref 150–400)
RBC: 4.5 MIL/uL (ref 3.87–5.11)
RDW: 13.8 % (ref 11.5–15.5)
WBC: 14.6 10*3/uL — ABNORMAL HIGH (ref 4.0–10.5)

## 2013-02-01 NOTE — ED Notes (Signed)
Pt c/o L flank pain that radiates into L groin and increasing diarrhea x 4 days.  Pain score 7/10.  [facility], told she had a kidney stone, and referred to an Urologist.  Pt sts pain has not subsided.  When she called the urologist, she was informed that they think the problem is diverticulitis, not a kidney stone.  Hx of diverticulitis.

## 2013-02-01 NOTE — ED Notes (Signed)
Pt not in waiting room x 2 

## 2013-02-01 NOTE — ED Notes (Signed)
Pt not in waiting room x 1 

## 2013-02-02 NOTE — ED Provider Notes (Signed)
This provider signed up for the patient. Patient was called in the waiting room numerous times with no answer. Patient left after being triage. Patient left before this provider was able to interview and assess the patient. This provider did not partake in any medical decision making for this patient.   Raymon MuttonMarissa Kendrell Lottman, PA-C 02/02/13 1130

## 2013-02-12 DIAGNOSIS — R197 Diarrhea, unspecified: Secondary | ICD-10-CM | POA: Diagnosis not present

## 2013-02-12 DIAGNOSIS — K5289 Other specified noninfective gastroenteritis and colitis: Secondary | ICD-10-CM | POA: Diagnosis not present

## 2013-02-12 DIAGNOSIS — F17200 Nicotine dependence, unspecified, uncomplicated: Secondary | ICD-10-CM | POA: Diagnosis not present

## 2013-02-12 DIAGNOSIS — Z888 Allergy status to other drugs, medicaments and biological substances status: Secondary | ICD-10-CM | POA: Diagnosis not present

## 2013-02-17 DIAGNOSIS — R109 Unspecified abdominal pain: Secondary | ICD-10-CM | POA: Diagnosis not present

## 2013-02-17 DIAGNOSIS — IMO0002 Reserved for concepts with insufficient information to code with codable children: Secondary | ICD-10-CM | POA: Diagnosis not present

## 2013-02-17 DIAGNOSIS — G90529 Complex regional pain syndrome I of unspecified lower limb: Secondary | ICD-10-CM | POA: Diagnosis not present

## 2013-02-17 DIAGNOSIS — G90519 Complex regional pain syndrome I of unspecified upper limb: Secondary | ICD-10-CM | POA: Diagnosis not present

## 2013-03-08 DIAGNOSIS — M549 Dorsalgia, unspecified: Secondary | ICD-10-CM | POA: Diagnosis not present

## 2013-03-08 DIAGNOSIS — N390 Urinary tract infection, site not specified: Secondary | ICD-10-CM | POA: Diagnosis not present

## 2013-03-12 DIAGNOSIS — K5730 Diverticulosis of large intestine without perforation or abscess without bleeding: Secondary | ICD-10-CM | POA: Diagnosis not present

## 2013-03-12 DIAGNOSIS — I10 Essential (primary) hypertension: Secondary | ICD-10-CM | POA: Diagnosis not present

## 2013-03-12 DIAGNOSIS — G90519 Complex regional pain syndrome I of unspecified upper limb: Secondary | ICD-10-CM | POA: Diagnosis not present

## 2013-03-12 DIAGNOSIS — G894 Chronic pain syndrome: Secondary | ICD-10-CM | POA: Diagnosis not present

## 2013-03-12 DIAGNOSIS — M79609 Pain in unspecified limb: Secondary | ICD-10-CM | POA: Diagnosis not present

## 2013-03-12 DIAGNOSIS — E785 Hyperlipidemia, unspecified: Secondary | ICD-10-CM | POA: Diagnosis not present

## 2013-03-12 DIAGNOSIS — Z79899 Other long term (current) drug therapy: Secondary | ICD-10-CM | POA: Diagnosis not present

## 2013-03-12 DIAGNOSIS — Z800 Family history of malignant neoplasm of digestive organs: Secondary | ICD-10-CM | POA: Diagnosis not present

## 2013-03-12 DIAGNOSIS — F411 Generalized anxiety disorder: Secondary | ICD-10-CM | POA: Diagnosis not present

## 2013-03-12 DIAGNOSIS — F17200 Nicotine dependence, unspecified, uncomplicated: Secondary | ICD-10-CM | POA: Diagnosis not present

## 2013-03-12 DIAGNOSIS — Z8711 Personal history of peptic ulcer disease: Secondary | ICD-10-CM | POA: Diagnosis not present

## 2013-03-25 DIAGNOSIS — G894 Chronic pain syndrome: Secondary | ICD-10-CM | POA: Diagnosis not present

## 2013-03-25 DIAGNOSIS — Z8249 Family history of ischemic heart disease and other diseases of the circulatory system: Secondary | ICD-10-CM | POA: Diagnosis not present

## 2013-03-25 DIAGNOSIS — F411 Generalized anxiety disorder: Secondary | ICD-10-CM | POA: Diagnosis not present

## 2013-03-25 DIAGNOSIS — E785 Hyperlipidemia, unspecified: Secondary | ICD-10-CM | POA: Diagnosis not present

## 2013-03-25 DIAGNOSIS — K8020 Calculus of gallbladder without cholecystitis without obstruction: Secondary | ICD-10-CM | POA: Diagnosis not present

## 2013-03-25 DIAGNOSIS — Z79899 Other long term (current) drug therapy: Secondary | ICD-10-CM | POA: Diagnosis not present

## 2013-03-25 DIAGNOSIS — R109 Unspecified abdominal pain: Secondary | ICD-10-CM | POA: Diagnosis not present

## 2013-03-25 DIAGNOSIS — M5137 Other intervertebral disc degeneration, lumbosacral region: Secondary | ICD-10-CM | POA: Diagnosis not present

## 2013-03-25 DIAGNOSIS — K5730 Diverticulosis of large intestine without perforation or abscess without bleeding: Secondary | ICD-10-CM | POA: Diagnosis not present

## 2013-03-25 DIAGNOSIS — IMO0002 Reserved for concepts with insufficient information to code with codable children: Secondary | ICD-10-CM | POA: Diagnosis not present

## 2013-03-25 DIAGNOSIS — G90519 Complex regional pain syndrome I of unspecified upper limb: Secondary | ICD-10-CM | POA: Diagnosis not present

## 2013-03-25 DIAGNOSIS — I10 Essential (primary) hypertension: Secondary | ICD-10-CM | POA: Diagnosis not present

## 2013-03-25 DIAGNOSIS — Z800 Family history of malignant neoplasm of digestive organs: Secondary | ICD-10-CM | POA: Diagnosis not present

## 2013-03-25 DIAGNOSIS — Z881 Allergy status to other antibiotic agents status: Secondary | ICD-10-CM | POA: Diagnosis not present

## 2013-03-25 DIAGNOSIS — Z8489 Family history of other specified conditions: Secondary | ICD-10-CM | POA: Diagnosis not present

## 2013-03-25 DIAGNOSIS — Z888 Allergy status to other drugs, medicaments and biological substances status: Secondary | ICD-10-CM | POA: Diagnosis not present

## 2013-03-25 DIAGNOSIS — Z9889 Other specified postprocedural states: Secondary | ICD-10-CM | POA: Diagnosis not present

## 2013-03-25 DIAGNOSIS — R51 Headache: Secondary | ICD-10-CM | POA: Diagnosis not present

## 2013-03-25 DIAGNOSIS — F17200 Nicotine dependence, unspecified, uncomplicated: Secondary | ICD-10-CM | POA: Diagnosis not present

## 2013-03-31 DIAGNOSIS — R109 Unspecified abdominal pain: Secondary | ICD-10-CM | POA: Diagnosis not present

## 2013-03-31 DIAGNOSIS — G894 Chronic pain syndrome: Secondary | ICD-10-CM | POA: Diagnosis not present

## 2013-03-31 DIAGNOSIS — G9050 Complex regional pain syndrome I, unspecified: Secondary | ICD-10-CM | POA: Diagnosis not present

## 2013-03-31 DIAGNOSIS — IMO0002 Reserved for concepts with insufficient information to code with codable children: Secondary | ICD-10-CM | POA: Diagnosis not present

## 2013-04-16 DIAGNOSIS — G894 Chronic pain syndrome: Secondary | ICD-10-CM | POA: Diagnosis not present

## 2013-04-16 DIAGNOSIS — G90519 Complex regional pain syndrome I of unspecified upper limb: Secondary | ICD-10-CM | POA: Diagnosis not present

## 2013-04-16 DIAGNOSIS — F411 Generalized anxiety disorder: Secondary | ICD-10-CM | POA: Diagnosis not present

## 2013-04-16 DIAGNOSIS — Z8711 Personal history of peptic ulcer disease: Secondary | ICD-10-CM | POA: Diagnosis not present

## 2013-04-16 DIAGNOSIS — R51 Headache: Secondary | ICD-10-CM | POA: Diagnosis not present

## 2013-04-16 DIAGNOSIS — Z800 Family history of malignant neoplasm of digestive organs: Secondary | ICD-10-CM | POA: Diagnosis not present

## 2013-04-16 DIAGNOSIS — IMO0002 Reserved for concepts with insufficient information to code with codable children: Secondary | ICD-10-CM | POA: Diagnosis not present

## 2013-04-16 DIAGNOSIS — Z8249 Family history of ischemic heart disease and other diseases of the circulatory system: Secondary | ICD-10-CM | POA: Diagnosis not present

## 2013-04-16 DIAGNOSIS — Z9089 Acquired absence of other organs: Secondary | ICD-10-CM | POA: Diagnosis not present

## 2013-04-16 DIAGNOSIS — R109 Unspecified abdominal pain: Secondary | ICD-10-CM | POA: Diagnosis not present

## 2013-04-16 DIAGNOSIS — E785 Hyperlipidemia, unspecified: Secondary | ICD-10-CM | POA: Diagnosis not present

## 2013-04-16 DIAGNOSIS — M545 Low back pain, unspecified: Secondary | ICD-10-CM | POA: Diagnosis not present

## 2013-04-16 DIAGNOSIS — Z8349 Family history of other endocrine, nutritional and metabolic diseases: Secondary | ICD-10-CM | POA: Diagnosis not present

## 2013-04-16 DIAGNOSIS — Z883 Allergy status to other anti-infective agents status: Secondary | ICD-10-CM | POA: Diagnosis not present

## 2013-04-16 DIAGNOSIS — Z820 Family history of epilepsy and other diseases of the nervous system: Secondary | ICD-10-CM | POA: Diagnosis not present

## 2013-04-16 DIAGNOSIS — F17200 Nicotine dependence, unspecified, uncomplicated: Secondary | ICD-10-CM | POA: Diagnosis not present

## 2013-04-16 DIAGNOSIS — I10 Essential (primary) hypertension: Secondary | ICD-10-CM | POA: Diagnosis not present

## 2013-04-16 DIAGNOSIS — Z888 Allergy status to other drugs, medicaments and biological substances status: Secondary | ICD-10-CM | POA: Diagnosis not present

## 2013-04-21 DIAGNOSIS — M545 Low back pain, unspecified: Secondary | ICD-10-CM | POA: Diagnosis not present

## 2013-04-29 DIAGNOSIS — I10 Essential (primary) hypertension: Secondary | ICD-10-CM | POA: Diagnosis not present

## 2013-04-29 DIAGNOSIS — K5730 Diverticulosis of large intestine without perforation or abscess without bleeding: Secondary | ICD-10-CM | POA: Diagnosis not present

## 2013-04-29 DIAGNOSIS — F17200 Nicotine dependence, unspecified, uncomplicated: Secondary | ICD-10-CM | POA: Diagnosis not present

## 2013-04-29 DIAGNOSIS — E785 Hyperlipidemia, unspecified: Secondary | ICD-10-CM | POA: Diagnosis not present

## 2013-04-29 DIAGNOSIS — G90519 Complex regional pain syndrome I of unspecified upper limb: Secondary | ICD-10-CM | POA: Diagnosis not present

## 2013-04-29 DIAGNOSIS — N3289 Other specified disorders of bladder: Secondary | ICD-10-CM | POA: Diagnosis not present

## 2013-04-29 DIAGNOSIS — Z888 Allergy status to other drugs, medicaments and biological substances status: Secondary | ICD-10-CM | POA: Diagnosis not present

## 2013-04-29 DIAGNOSIS — R109 Unspecified abdominal pain: Secondary | ICD-10-CM | POA: Diagnosis not present

## 2013-04-29 DIAGNOSIS — F411 Generalized anxiety disorder: Secondary | ICD-10-CM | POA: Diagnosis not present

## 2013-05-10 DIAGNOSIS — G90519 Complex regional pain syndrome I of unspecified upper limb: Secondary | ICD-10-CM | POA: Diagnosis not present

## 2013-05-10 DIAGNOSIS — Z79899 Other long term (current) drug therapy: Secondary | ICD-10-CM | POA: Diagnosis not present

## 2013-05-10 DIAGNOSIS — M5137 Other intervertebral disc degeneration, lumbosacral region: Secondary | ICD-10-CM | POA: Diagnosis not present

## 2013-05-10 DIAGNOSIS — G894 Chronic pain syndrome: Secondary | ICD-10-CM | POA: Diagnosis not present

## 2013-05-10 DIAGNOSIS — Z8711 Personal history of peptic ulcer disease: Secondary | ICD-10-CM | POA: Diagnosis not present

## 2013-05-10 DIAGNOSIS — IMO0002 Reserved for concepts with insufficient information to code with codable children: Secondary | ICD-10-CM | POA: Diagnosis not present

## 2013-05-10 DIAGNOSIS — M545 Low back pain, unspecified: Secondary | ICD-10-CM | POA: Diagnosis not present

## 2013-05-10 DIAGNOSIS — Z888 Allergy status to other drugs, medicaments and biological substances status: Secondary | ICD-10-CM | POA: Diagnosis not present

## 2013-05-10 DIAGNOSIS — F17200 Nicotine dependence, unspecified, uncomplicated: Secondary | ICD-10-CM | POA: Diagnosis not present

## 2013-05-10 DIAGNOSIS — E785 Hyperlipidemia, unspecified: Secondary | ICD-10-CM | POA: Diagnosis not present

## 2013-05-10 DIAGNOSIS — Z881 Allergy status to other antibiotic agents status: Secondary | ICD-10-CM | POA: Diagnosis not present

## 2013-05-10 DIAGNOSIS — Z8249 Family history of ischemic heart disease and other diseases of the circulatory system: Secondary | ICD-10-CM | POA: Diagnosis not present

## 2013-05-10 DIAGNOSIS — Z800 Family history of malignant neoplasm of digestive organs: Secondary | ICD-10-CM | POA: Diagnosis not present

## 2013-05-10 DIAGNOSIS — I10 Essential (primary) hypertension: Secondary | ICD-10-CM | POA: Diagnosis not present

## 2013-05-10 DIAGNOSIS — R51 Headache: Secondary | ICD-10-CM | POA: Diagnosis not present

## 2013-05-19 DIAGNOSIS — Z5181 Encounter for therapeutic drug level monitoring: Secondary | ICD-10-CM | POA: Diagnosis not present

## 2013-05-19 DIAGNOSIS — G9050 Complex regional pain syndrome I, unspecified: Secondary | ICD-10-CM | POA: Diagnosis not present

## 2013-05-19 DIAGNOSIS — IMO0002 Reserved for concepts with insufficient information to code with codable children: Secondary | ICD-10-CM | POA: Diagnosis not present

## 2013-05-19 DIAGNOSIS — R109 Unspecified abdominal pain: Secondary | ICD-10-CM | POA: Diagnosis not present

## 2013-05-19 DIAGNOSIS — M545 Low back pain, unspecified: Secondary | ICD-10-CM | POA: Diagnosis not present

## 2013-05-19 DIAGNOSIS — Z79899 Other long term (current) drug therapy: Secondary | ICD-10-CM | POA: Diagnosis not present

## 2013-05-25 DIAGNOSIS — M545 Low back pain, unspecified: Secondary | ICD-10-CM | POA: Diagnosis not present

## 2013-05-25 DIAGNOSIS — Z800 Family history of malignant neoplasm of digestive organs: Secondary | ICD-10-CM | POA: Diagnosis not present

## 2013-05-25 DIAGNOSIS — Z8249 Family history of ischemic heart disease and other diseases of the circulatory system: Secondary | ICD-10-CM | POA: Diagnosis not present

## 2013-05-25 DIAGNOSIS — K5730 Diverticulosis of large intestine without perforation or abscess without bleeding: Secondary | ICD-10-CM | POA: Diagnosis not present

## 2013-05-25 DIAGNOSIS — Z881 Allergy status to other antibiotic agents status: Secondary | ICD-10-CM | POA: Diagnosis not present

## 2013-05-25 DIAGNOSIS — Z820 Family history of epilepsy and other diseases of the nervous system: Secondary | ICD-10-CM | POA: Diagnosis not present

## 2013-05-25 DIAGNOSIS — G90519 Complex regional pain syndrome I of unspecified upper limb: Secondary | ICD-10-CM | POA: Diagnosis not present

## 2013-05-25 DIAGNOSIS — G894 Chronic pain syndrome: Secondary | ICD-10-CM | POA: Diagnosis not present

## 2013-05-25 DIAGNOSIS — I10 Essential (primary) hypertension: Secondary | ICD-10-CM | POA: Diagnosis not present

## 2013-05-25 DIAGNOSIS — F17200 Nicotine dependence, unspecified, uncomplicated: Secondary | ICD-10-CM | POA: Diagnosis not present

## 2013-05-25 DIAGNOSIS — F411 Generalized anxiety disorder: Secondary | ICD-10-CM | POA: Diagnosis not present

## 2013-05-25 DIAGNOSIS — R109 Unspecified abdominal pain: Secondary | ICD-10-CM | POA: Diagnosis not present

## 2013-05-25 DIAGNOSIS — IMO0002 Reserved for concepts with insufficient information to code with codable children: Secondary | ICD-10-CM | POA: Diagnosis not present

## 2013-05-25 DIAGNOSIS — M47817 Spondylosis without myelopathy or radiculopathy, lumbosacral region: Secondary | ICD-10-CM | POA: Diagnosis not present

## 2013-05-25 DIAGNOSIS — M549 Dorsalgia, unspecified: Secondary | ICD-10-CM | POA: Diagnosis not present

## 2013-05-25 DIAGNOSIS — E785 Hyperlipidemia, unspecified: Secondary | ICD-10-CM | POA: Diagnosis not present

## 2013-05-25 DIAGNOSIS — R51 Headache: Secondary | ICD-10-CM | POA: Diagnosis not present

## 2013-05-25 DIAGNOSIS — Z8711 Personal history of peptic ulcer disease: Secondary | ICD-10-CM | POA: Diagnosis not present

## 2013-06-17 DIAGNOSIS — G90519 Complex regional pain syndrome I of unspecified upper limb: Secondary | ICD-10-CM | POA: Diagnosis not present

## 2013-06-17 DIAGNOSIS — M545 Low back pain, unspecified: Secondary | ICD-10-CM | POA: Diagnosis not present

## 2013-06-17 DIAGNOSIS — IMO0002 Reserved for concepts with insufficient information to code with codable children: Secondary | ICD-10-CM | POA: Diagnosis not present

## 2013-06-17 DIAGNOSIS — Z79899 Other long term (current) drug therapy: Secondary | ICD-10-CM | POA: Diagnosis not present

## 2013-06-17 DIAGNOSIS — Z8711 Personal history of peptic ulcer disease: Secondary | ICD-10-CM | POA: Diagnosis not present

## 2013-06-17 DIAGNOSIS — I10 Essential (primary) hypertension: Secondary | ICD-10-CM | POA: Diagnosis not present

## 2013-06-17 DIAGNOSIS — R51 Headache: Secondary | ICD-10-CM | POA: Diagnosis not present

## 2013-06-17 DIAGNOSIS — R079 Chest pain, unspecified: Secondary | ICD-10-CM | POA: Diagnosis not present

## 2013-06-17 DIAGNOSIS — E785 Hyperlipidemia, unspecified: Secondary | ICD-10-CM | POA: Diagnosis not present

## 2013-06-17 DIAGNOSIS — Z8349 Family history of other endocrine, nutritional and metabolic diseases: Secondary | ICD-10-CM | POA: Diagnosis not present

## 2013-06-17 DIAGNOSIS — Z8249 Family history of ischemic heart disease and other diseases of the circulatory system: Secondary | ICD-10-CM | POA: Diagnosis not present

## 2013-06-17 DIAGNOSIS — M79609 Pain in unspecified limb: Secondary | ICD-10-CM | POA: Diagnosis not present

## 2013-06-17 DIAGNOSIS — Z883 Allergy status to other anti-infective agents status: Secondary | ICD-10-CM | POA: Diagnosis not present

## 2013-06-17 DIAGNOSIS — M47817 Spondylosis without myelopathy or radiculopathy, lumbosacral region: Secondary | ICD-10-CM | POA: Diagnosis not present

## 2013-06-17 DIAGNOSIS — Z888 Allergy status to other drugs, medicaments and biological substances status: Secondary | ICD-10-CM | POA: Diagnosis not present

## 2013-06-17 DIAGNOSIS — F411 Generalized anxiety disorder: Secondary | ICD-10-CM | POA: Diagnosis not present

## 2013-06-17 DIAGNOSIS — M25559 Pain in unspecified hip: Secondary | ICD-10-CM | POA: Diagnosis not present

## 2013-06-17 DIAGNOSIS — G894 Chronic pain syndrome: Secondary | ICD-10-CM | POA: Diagnosis not present

## 2013-06-17 DIAGNOSIS — Z820 Family history of epilepsy and other diseases of the nervous system: Secondary | ICD-10-CM | POA: Diagnosis not present

## 2013-06-17 DIAGNOSIS — Z9089 Acquired absence of other organs: Secondary | ICD-10-CM | POA: Diagnosis not present

## 2013-06-17 DIAGNOSIS — Z800 Family history of malignant neoplasm of digestive organs: Secondary | ICD-10-CM | POA: Diagnosis not present

## 2013-06-17 DIAGNOSIS — F17200 Nicotine dependence, unspecified, uncomplicated: Secondary | ICD-10-CM | POA: Diagnosis not present

## 2013-06-27 DIAGNOSIS — R197 Diarrhea, unspecified: Secondary | ICD-10-CM | POA: Diagnosis not present

## 2013-06-27 DIAGNOSIS — G8929 Other chronic pain: Secondary | ICD-10-CM | POA: Diagnosis not present

## 2013-06-27 DIAGNOSIS — R110 Nausea: Secondary | ICD-10-CM | POA: Diagnosis not present

## 2013-06-27 DIAGNOSIS — I10 Essential (primary) hypertension: Secondary | ICD-10-CM | POA: Diagnosis not present

## 2013-06-27 DIAGNOSIS — E785 Hyperlipidemia, unspecified: Secondary | ICD-10-CM | POA: Diagnosis not present

## 2013-06-27 DIAGNOSIS — F17200 Nicotine dependence, unspecified, uncomplicated: Secondary | ICD-10-CM | POA: Diagnosis not present

## 2013-06-27 DIAGNOSIS — R109 Unspecified abdominal pain: Secondary | ICD-10-CM | POA: Diagnosis not present

## 2013-06-27 DIAGNOSIS — Z888 Allergy status to other drugs, medicaments and biological substances status: Secondary | ICD-10-CM | POA: Diagnosis not present

## 2013-06-30 DIAGNOSIS — IMO0002 Reserved for concepts with insufficient information to code with codable children: Secondary | ICD-10-CM | POA: Diagnosis not present

## 2013-06-30 DIAGNOSIS — M545 Low back pain, unspecified: Secondary | ICD-10-CM | POA: Diagnosis not present

## 2013-06-30 DIAGNOSIS — G9050 Complex regional pain syndrome I, unspecified: Secondary | ICD-10-CM | POA: Diagnosis not present

## 2013-06-30 DIAGNOSIS — G894 Chronic pain syndrome: Secondary | ICD-10-CM | POA: Diagnosis not present

## 2013-08-03 DIAGNOSIS — Z888 Allergy status to other drugs, medicaments and biological substances status: Secondary | ICD-10-CM | POA: Diagnosis not present

## 2013-08-03 DIAGNOSIS — Z800 Family history of malignant neoplasm of digestive organs: Secondary | ICD-10-CM | POA: Diagnosis not present

## 2013-08-03 DIAGNOSIS — G894 Chronic pain syndrome: Secondary | ICD-10-CM | POA: Diagnosis not present

## 2013-08-03 DIAGNOSIS — E785 Hyperlipidemia, unspecified: Secondary | ICD-10-CM | POA: Diagnosis not present

## 2013-08-03 DIAGNOSIS — K279 Peptic ulcer, site unspecified, unspecified as acute or chronic, without hemorrhage or perforation: Secondary | ICD-10-CM | POA: Diagnosis not present

## 2013-08-03 DIAGNOSIS — G589 Mononeuropathy, unspecified: Secondary | ICD-10-CM | POA: Diagnosis not present

## 2013-08-03 DIAGNOSIS — G9059 Complex regional pain syndrome I of other specified site: Secondary | ICD-10-CM | POA: Diagnosis not present

## 2013-08-03 DIAGNOSIS — Z79899 Other long term (current) drug therapy: Secondary | ICD-10-CM | POA: Diagnosis not present

## 2013-08-03 DIAGNOSIS — G90519 Complex regional pain syndrome I of unspecified upper limb: Secondary | ICD-10-CM | POA: Diagnosis not present

## 2013-08-03 DIAGNOSIS — F411 Generalized anxiety disorder: Secondary | ICD-10-CM | POA: Diagnosis not present

## 2013-08-03 DIAGNOSIS — F17200 Nicotine dependence, unspecified, uncomplicated: Secondary | ICD-10-CM | POA: Diagnosis not present

## 2013-08-03 DIAGNOSIS — Z8249 Family history of ischemic heart disease and other diseases of the circulatory system: Secondary | ICD-10-CM | POA: Diagnosis not present

## 2013-08-03 DIAGNOSIS — R51 Headache: Secondary | ICD-10-CM | POA: Diagnosis not present

## 2013-08-03 DIAGNOSIS — I10 Essential (primary) hypertension: Secondary | ICD-10-CM | POA: Diagnosis not present

## 2013-09-08 DIAGNOSIS — IMO0002 Reserved for concepts with insufficient information to code with codable children: Secondary | ICD-10-CM | POA: Diagnosis not present

## 2013-09-08 DIAGNOSIS — G90519 Complex regional pain syndrome I of unspecified upper limb: Secondary | ICD-10-CM | POA: Diagnosis not present

## 2013-09-08 DIAGNOSIS — M545 Low back pain, unspecified: Secondary | ICD-10-CM | POA: Diagnosis not present

## 2013-09-08 DIAGNOSIS — G894 Chronic pain syndrome: Secondary | ICD-10-CM | POA: Diagnosis not present

## 2013-09-21 ENCOUNTER — Emergency Department (HOSPITAL_COMMUNITY): Payer: Medicare Other

## 2013-09-21 ENCOUNTER — Emergency Department (HOSPITAL_COMMUNITY)
Admission: EM | Admit: 2013-09-21 | Discharge: 2013-09-21 | Disposition: A | Discharge status: Home / Self Care | Payer: Medicare Other | Attending: Emergency Medicine | Admitting: Emergency Medicine

## 2013-09-21 ENCOUNTER — Encounter (HOSPITAL_COMMUNITY): Payer: Self-pay | Admitting: Emergency Medicine

## 2013-09-21 DIAGNOSIS — Z79899 Other long term (current) drug therapy: Secondary | ICD-10-CM | POA: Insufficient documentation

## 2013-09-21 DIAGNOSIS — R079 Chest pain, unspecified: Secondary | ICD-10-CM | POA: Diagnosis not present

## 2013-09-21 DIAGNOSIS — I10 Essential (primary) hypertension: Secondary | ICD-10-CM

## 2013-09-21 DIAGNOSIS — R0789 Other chest pain: Secondary | ICD-10-CM | POA: Diagnosis not present

## 2013-09-21 DIAGNOSIS — Z8719 Personal history of other diseases of the digestive system: Secondary | ICD-10-CM | POA: Diagnosis not present

## 2013-09-21 DIAGNOSIS — G43909 Migraine, unspecified, not intractable, without status migrainosus: Secondary | ICD-10-CM | POA: Diagnosis not present

## 2013-09-21 DIAGNOSIS — F17200 Nicotine dependence, unspecified, uncomplicated: Secondary | ICD-10-CM | POA: Diagnosis not present

## 2013-09-21 DIAGNOSIS — Z860100 Personal history of colon polyps, unspecified: Secondary | ICD-10-CM | POA: Insufficient documentation

## 2013-09-21 DIAGNOSIS — G8929 Other chronic pain: Secondary | ICD-10-CM | POA: Diagnosis not present

## 2013-09-21 DIAGNOSIS — Z7982 Long term (current) use of aspirin: Secondary | ICD-10-CM | POA: Insufficient documentation

## 2013-09-21 DIAGNOSIS — R10A0 Flank pain, unspecified side: Secondary | ICD-10-CM

## 2013-09-21 DIAGNOSIS — R109 Unspecified abdominal pain: Secondary | ICD-10-CM | POA: Insufficient documentation

## 2013-09-21 DIAGNOSIS — R110 Nausea: Secondary | ICD-10-CM | POA: Diagnosis not present

## 2013-09-21 DIAGNOSIS — H539 Unspecified visual disturbance: Secondary | ICD-10-CM | POA: Diagnosis not present

## 2013-09-21 DIAGNOSIS — K2970 Gastritis, unspecified, without bleeding: Secondary | ICD-10-CM | POA: Diagnosis not present

## 2013-09-21 DIAGNOSIS — R319 Hematuria, unspecified: Secondary | ICD-10-CM | POA: Diagnosis not present

## 2013-09-21 LAB — CBC WITH DIFFERENTIAL/PLATELET
BASOS ABS: 0.1 10*3/uL (ref 0.0–0.1)
BASOS PCT: 1 % (ref 0–1)
Eosinophils Absolute: 0.1 10*3/uL (ref 0.0–0.7)
Eosinophils Relative: 1 % (ref 0–5)
HCT: 44.3 % (ref 36.0–46.0)
Hemoglobin: 15.6 g/dL — ABNORMAL HIGH (ref 12.0–15.0)
Lymphocytes Relative: 32 % (ref 12–46)
Lymphs Abs: 3.6 10*3/uL (ref 0.7–4.0)
MCH: 32.4 pg (ref 26.0–34.0)
MCHC: 35.2 g/dL (ref 30.0–36.0)
MCV: 92.1 fL (ref 78.0–100.0)
MONO ABS: 0.6 10*3/uL (ref 0.1–1.0)
Monocytes Relative: 6 % (ref 3–12)
NEUTROS ABS: 6.8 10*3/uL (ref 1.7–7.7)
NEUTROS PCT: 60 % (ref 43–77)
Platelets: 271 10*3/uL (ref 150–400)
RBC: 4.81 MIL/uL (ref 3.87–5.11)
RDW: 13.1 % (ref 11.5–15.5)
WBC: 11.2 10*3/uL — ABNORMAL HIGH (ref 4.0–10.5)

## 2013-09-21 LAB — HEPATIC FUNCTION PANEL
ALT: 7 U/L (ref 0–35)
AST: 11 U/L (ref 0–37)
Albumin: 3.5 g/dL (ref 3.5–5.2)
Alkaline Phosphatase: 75 U/L (ref 39–117)
TOTAL PROTEIN: 7.1 g/dL (ref 6.0–8.3)
Total Bilirubin: 0.2 mg/dL — ABNORMAL LOW (ref 0.3–1.2)

## 2013-09-21 LAB — URINALYSIS, ROUTINE W REFLEX MICROSCOPIC
Glucose, UA: NEGATIVE mg/dL
HGB URINE DIPSTICK: NEGATIVE
Ketones, ur: NEGATIVE mg/dL
Leukocytes, UA: NEGATIVE
Nitrite: NEGATIVE
PROTEIN: NEGATIVE mg/dL
SPECIFIC GRAVITY, URINE: 1.015 (ref 1.005–1.030)
UROBILINOGEN UA: 0.2 mg/dL (ref 0.0–1.0)
pH: 6 (ref 5.0–8.0)

## 2013-09-21 LAB — BASIC METABOLIC PANEL
Anion gap: 13 (ref 5–15)
BUN: 7 mg/dL (ref 6–23)
CHLORIDE: 106 meq/L (ref 96–112)
CO2: 24 mEq/L (ref 19–32)
CREATININE: 0.62 mg/dL (ref 0.50–1.10)
Calcium: 9.3 mg/dL (ref 8.4–10.5)
GFR calc non Af Amer: >90 mL/min (ref >90)
Glucose, Bld: 123 mg/dL — ABNORMAL HIGH (ref 70–99)
POTASSIUM: 3.7 meq/L (ref 3.7–5.3)
Sodium: 143 mEq/L (ref 137–147)

## 2013-09-21 LAB — TROPONIN I: Troponin I: <0.3 ng/mL (ref <0.30)

## 2013-09-21 MED ORDER — ONDANSETRON HCL 4 MG/2ML IJ SOLN
4.0000 mg | Freq: Once | INTRAMUSCULAR | Status: AC
  Administered 2013-09-21: 4 mg via INTRAVENOUS
  Filled 2013-09-21: qty 2

## 2013-09-21 MED ORDER — HYDROMORPHONE HCL PF 1 MG/ML IJ SOLN
1.0000 mg | Freq: Once | INTRAMUSCULAR | Status: AC
  Administered 2013-09-21: 1 mg via INTRAVENOUS
  Filled 2013-09-21: qty 1

## 2013-09-21 MED ORDER — LORAZEPAM 2 MG/ML IJ SOLN
0.5000 mg | Freq: Once | INTRAMUSCULAR | Status: AC
  Administered 2013-09-21: 0.5 mg via INTRAVENOUS
  Filled 2013-09-21: qty 1

## 2013-09-21 MED ORDER — CEPHALEXIN 500 MG PO CAPS: 500.0000 mg | ORAL_CAPSULE | Freq: Four times a day (QID) | ORAL | Status: DC

## 2013-09-21 MED ORDER — OXYCODONE-ACETAMINOPHEN 5-325 MG PO TABS: 1.0000 | ORAL_TABLET | Freq: Four times a day (QID) | ORAL | Status: DC | PRN

## 2013-09-21 MED ORDER — ONDANSETRON 4 MG PO TBDP: ORAL_TABLET | ORAL | Status: DC

## 2013-09-21 MED ORDER — KETOROLAC TROMETHAMINE 30 MG/ML IJ SOLN
30.0000 mg | Freq: Once | INTRAMUSCULAR | Status: AC
  Administered 2013-09-21: 30 mg via INTRAVENOUS
  Filled 2013-09-21: qty 1

## 2013-09-21 MED ORDER — LISINOPRIL 20 MG PO TABS: 20.0000 mg | ORAL_TABLET | Freq: Every day | ORAL | Status: DC

## 2013-09-21 NOTE — ED Provider Notes (Signed)
CSN: [id number]     Arrival date & time 09/21/13  1511 History   First MD Initiated Contact with Patient 09/21/13 1518     Chief Complaint  Patient presents with  . Chest Pain     (Consider location/radiation/quality/duration/timing/severity/associated sxs/prior Treatment) Patient is a 48 y.o. female presenting with chest pain. The history is provided by the patient (pt complains of chest pain and back pain).  Chest Pain Pain location:  Substernal area Pain quality: aching   Pain radiates to:  Does not radiate Pain radiates to the back: no   Pain severity:  Mild Onset quality:  Gradual Timing:  Intermittent Progression:  Waxing and waning Chronicity:  New Context: not breathing   Associated symptoms: no abdominal pain, no back pain, no cough, no fatigue and no headache     Past Medical History  Diagnosis Date  . RSD (reflex sympathetic dystrophy)     a. L shoulder  . Migraine   . Colon polyps     a. s/p colonoscopy and polypectomy in 2003  . Abdominal pain, chronic, left lower quadrant     a. s/p Laproscopy in 2004 w/o finding of source of discomfort.  [name] GERD (gastroesophageal reflux disease)   . Hiatal hernia     a. small by EGD 2006.  [name] Visual disturbance     a. 12/2011 Left eye visual changes->w/u for TIA->Carotid U/S:  No ICA stenosis; Head CT: No acute abnl; MRI/MRA: mild RPCA stenosis; Echo: EF 55-60%, Triv AI, No PFO.  . Tobacco abuse     a. 50 pack year hx.  . Cholelithiasis     a. s/p cholecystectomy 10/2011  . Chest pain     a. reportedly nl MV and echo 10/2011 - [place]  . Diverticulitis    Past Surgical History  Procedure Laterality Date  . Cesarean section    . Shoulder surgery    . Cholecystectomy      a. 10/2011  . Hernia repair     Family History  Problem Relation Age of Onset  . Dementia Father   . Parkinson's disease Father     alive @ 36  . Heart attack Mother     died @ 85   History  Substance Use Topics  . Smoking status:  Current Every Day Smoker -- 0.50 packs/day for 25 years    Types: Cigarettes  . Smokeless tobacco: Not on file     Comment: Smoked 2ppd for roughly 25 yrs but cut back to 1/2 ppd about 6 mos ago.  . Alcohol Use: No   OB History   Grav Para Term Preterm Abortions TAB SAB Ect Mult Living                 Review of Systems  Constitutional: Negative for appetite change and fatigue.  HENT: Negative for congestion, ear discharge and sinus pressure.   Eyes: Negative for discharge.  Respiratory: Negative for cough.   Cardiovascular: Positive for chest pain.  Gastrointestinal: Negative for abdominal pain and diarrhea.  Genitourinary: Negative for frequency and hematuria.  Musculoskeletal: Negative for back pain.  Skin: Negative for rash.  Neurological: Negative for seizures and headaches.  Psychiatric/Behavioral: Negative for hallucinations.      Allergies  Levaquin  Home Medications   Prior to Admission medications   Medication Sig Start Date End Date Taking? Authorizing Provider  HYDROcodone-acetaminophen (NORCO/VICODIN) 5-325 MG per tablet Take 1 tablet by mouth once as needed for moderate pain.   Yes Historical  Provider, MD  morphine (MS CONTIN) 30 MG 12 hr tablet Take 30 mg by mouth 3 (three) times daily. 10/25/13 11/24/13 Yes Historical Provider, MD  aspirin 81 MG chewable tablet Chew 324 mg by mouth once.    Historical Provider, MD  cephALEXin (KEFLEX) 500 MG capsule Take 1 capsule (500 mg total) by mouth 4 (four) times daily. 09/21/13   Benny Lennert, MD  lisinopril (PRINIVIL,ZESTRIL) 20 MG tablet Take 1 tablet (20 mg total) by mouth daily. 09/21/13   Benny Lennert, MD  ondansetron (ZOFRAN ODT) 4 MG disintegrating tablet  ODT q4 hours prn nausea/vomit 09/21/13   Benny Lennert, MD  oxyCODONE-acetaminophen (PERCOCET/ROXICET) 5-325 MG per tablet Take 1 tablet by mouth every 6 (six) hours as needed. 09/21/13   Benny Lennert, MD   BP 165/107  Pulse 101  Temp(Src) 98.9 F  (37.2 C) (Oral)  Resp 23  Ht  (1.6 m)  Wt 168 lb (76.204 kg)  BMI 29.77 kg/m2  SpO2 96% Physical Exam  Constitutional: She is oriented to person, place, and time. She appears well-developed.  HENT:  Head: Normocephalic.  Eyes: Conjunctivae and EOM are normal. No scleral icterus.  Neck: Neck supple. No thyromegaly present.  Cardiovascular: Normal rate and regular rhythm.  Exam reveals no gallop and no friction rub.   No murmur heard. Pulmonary/Chest: No stridor. She has no wheezes. She has no rales. She exhibits no tenderness.  Abdominal: She exhibits no distension. There is no tenderness. There is no rebound.  Musculoskeletal: Normal range of motion. She exhibits no edema.  Lymphadenopathy:    She has no cervical adenopathy.  Neurological: She is oriented to person, place, and time. She exhibits normal muscle tone. Coordination normal.  Skin: No rash noted. No erythema.  Psychiatric: She has a normal mood and affect. Her behavior is normal.    ED Course  Procedures (including critical care time) Labs Review Labs Reviewed  CBC WITH DIFFERENTIAL - Abnormal; Notable for the following:    WBC 11.2 (*)    Hemoglobin 15.6 (*)    All other components within normal limits  BASIC METABOLIC PANEL - Abnormal; Notable for the following:    Glucose, Bld 123 (*)    All other components within normal limits  URINALYSIS, ROUTINE W REFLEX MICROSCOPIC - Abnormal; Notable for the following:    Bilirubin Urine SMALL (*)    All other components within normal limits  HEPATIC FUNCTION PANEL - Abnormal; Notable for the following:    Total Bilirubin 0.2 (*)    All other components within normal limits  URINE CULTURE  TROPONIN I    Imaging Review Ct Abdomen Pelvis Wo Contrast  09/21/2013   CLINICAL DATA:  Left flank pain.  Hematuria.  Renal calculus.  EXAM: CT ABDOMEN AND PELVIS WITHOUT CONTRAST  TECHNIQUE: Multidetector CT imaging of the abdomen and pelvis was performed following the  standard protocol without IV contrast.  COMPARISON:  01/28/2013  FINDINGS: Lower chest:  Unremarkable  Hepatobiliary: Prior cholecystectomy.  Spleen: Intact  Pancreas: Intact  Stomach/Bowel: Several air-fluid levels in nondilated loops of proximal small bowel appendix normal. Scattered sigmoid colon diverticula without an associated inflammation.  Adrenals/urinary tract: No discrete stones or hydronephrosis. Overall negative.  Vascular/Lymphatic: Mild abdominal aortic atherosclerotic calcification.  Reproductive: Intact  Musculoskeletal: Intact  Other: Non  IMPRESSION: 1. No renal calculi or hydronephrosis. 2. Several air- fluid levels in nondilated loops of proximal small bowel -proximal enteritis is not excluded. 3. Minimal atherosclerosis. 4.  Scattered sigmoid colon diverticula do not appear inflamed.   Electronically Signed   By: Herbie Baltimore M.D.   On: 09/21/2013 19:12   Dg Chest Portable 1 View  09/21/2013   CLINICAL DATA:  Tightness in the chest.  Hypertension.  EXAM: PORTABLE CHEST - 1 VIEW  COMPARISON:  12/18/2011  FINDINGS: The heart size and mediastinal contours are within normal limits. Both lungs are clear. The visualized skeletal structures are unremarkable.  IMPRESSION: No active disease.   Electronically Signed   By: Herbie Baltimore M.D.   On: 09/21/2013 15:28     EKG Interpretation None      MDM   Final diagnoses:  Essential hypertension  Flank pain    Chest pain resolved with nl studies,   htn tx with lisinopril,  uti symptoms tx with keflex  The chart was scribed for me under my direct supervision.  I personally performed the history, physical, and medical decision making and all procedures in the evaluation of this patient.Benny Lennert, MD 09/21/13 [phone]

## 2013-09-21 NOTE — Discharge Instructions (Signed)
Follow up with a family md in 1 week °

## 2013-09-21 NOTE — ED Notes (Signed)
Pt reports went to [facility] urgent care last night and was told had a lot of blood in urine and was told she may have either a UTI or a kidney stone.  Also reports bp was elevated and chest felt tight.  Pt says chest still feels tight.  EMS reports bp was 192/140 and manually was 202/120.  EMS gave  aspirin pta.  Pt c/o left flank pain as well and rates at 8/10.

## 2013-09-23 LAB — URINE CULTURE
COLONY COUNT: NO GROWTH
CULTURE: NO GROWTH

## 2013-10-06 DIAGNOSIS — I10 Essential (primary) hypertension: Secondary | ICD-10-CM | POA: Diagnosis not present

## 2013-10-06 DIAGNOSIS — F411 Generalized anxiety disorder: Secondary | ICD-10-CM | POA: Diagnosis not present

## 2013-10-06 DIAGNOSIS — G589 Mononeuropathy, unspecified: Secondary | ICD-10-CM | POA: Diagnosis not present

## 2013-10-06 DIAGNOSIS — G90519 Complex regional pain syndrome I of unspecified upper limb: Secondary | ICD-10-CM | POA: Diagnosis not present

## 2013-10-06 DIAGNOSIS — G9050 Complex regional pain syndrome I, unspecified: Secondary | ICD-10-CM | POA: Diagnosis not present

## 2013-10-06 DIAGNOSIS — M79609 Pain in unspecified limb: Secondary | ICD-10-CM | POA: Diagnosis not present

## 2013-10-06 DIAGNOSIS — IMO0002 Reserved for concepts with insufficient information to code with codable children: Secondary | ICD-10-CM | POA: Diagnosis not present

## 2013-10-06 DIAGNOSIS — Z888 Allergy status to other drugs, medicaments and biological substances status: Secondary | ICD-10-CM | POA: Diagnosis not present

## 2013-10-06 DIAGNOSIS — E785 Hyperlipidemia, unspecified: Secondary | ICD-10-CM | POA: Diagnosis not present

## 2013-11-09 ENCOUNTER — Emergency Department (HOSPITAL_BASED_OUTPATIENT_CLINIC_OR_DEPARTMENT_OTHER): Payer: Medicare Other

## 2013-11-09 ENCOUNTER — Encounter (HOSPITAL_BASED_OUTPATIENT_CLINIC_OR_DEPARTMENT_OTHER): Payer: Self-pay | Admitting: *Deleted

## 2013-11-09 ENCOUNTER — Emergency Department (HOSPITAL_BASED_OUTPATIENT_CLINIC_OR_DEPARTMENT_OTHER)
Admission: EM | Admit: 2013-11-09 | Discharge: 2013-11-10 | Disposition: A | Discharge status: Home / Self Care | Payer: Medicare Other | Attending: Emergency Medicine | Admitting: Emergency Medicine

## 2013-11-09 DIAGNOSIS — Z720 Tobacco use: Secondary | ICD-10-CM | POA: Insufficient documentation

## 2013-11-09 DIAGNOSIS — Z86010 Personal history of colonic polyps: Secondary | ICD-10-CM | POA: Insufficient documentation

## 2013-11-09 DIAGNOSIS — R109 Unspecified abdominal pain: Secondary | ICD-10-CM | POA: Diagnosis present

## 2013-11-09 DIAGNOSIS — G8929 Other chronic pain: Secondary | ICD-10-CM | POA: Insufficient documentation

## 2013-11-09 DIAGNOSIS — R102 Pelvic and perineal pain: Secondary | ICD-10-CM

## 2013-11-09 DIAGNOSIS — R112 Nausea with vomiting, unspecified: Secondary | ICD-10-CM | POA: Diagnosis not present

## 2013-11-09 DIAGNOSIS — Z7982 Long term (current) use of aspirin: Secondary | ICD-10-CM | POA: Diagnosis not present

## 2013-11-09 DIAGNOSIS — Z79899 Other long term (current) drug therapy: Secondary | ICD-10-CM | POA: Diagnosis not present

## 2013-11-09 DIAGNOSIS — Z792 Long term (current) use of antibiotics: Secondary | ICD-10-CM | POA: Diagnosis not present

## 2013-11-09 DIAGNOSIS — Z8679 Personal history of other diseases of the circulatory system: Secondary | ICD-10-CM | POA: Diagnosis not present

## 2013-11-09 DIAGNOSIS — R1032 Left lower quadrant pain: Secondary | ICD-10-CM | POA: Insufficient documentation

## 2013-11-09 DIAGNOSIS — R197 Diarrhea, unspecified: Secondary | ICD-10-CM | POA: Diagnosis not present

## 2013-11-09 DIAGNOSIS — Z8719 Personal history of other diseases of the digestive system: Secondary | ICD-10-CM | POA: Insufficient documentation

## 2013-11-09 LAB — URINALYSIS, ROUTINE W REFLEX MICROSCOPIC
BILIRUBIN URINE: NEGATIVE
GLUCOSE, UA: NEGATIVE mg/dL
HGB URINE DIPSTICK: NEGATIVE
KETONES UR: NEGATIVE mg/dL
Leukocytes, UA: NEGATIVE
Nitrite: NEGATIVE
PH: 6.5 (ref 5.0–8.0)
PROTEIN: NEGATIVE mg/dL
Specific Gravity, Urine: 1.016 (ref 1.005–1.030)
Urobilinogen, UA: 1 mg/dL (ref 0.0–1.0)

## 2013-11-09 LAB — CBC WITH DIFFERENTIAL/PLATELET
BASOS PCT: 1 % (ref 0–1)
Basophils Absolute: 0.1 10*3/uL (ref 0.0–0.1)
Eosinophils Absolute: 0.2 10*3/uL (ref 0.0–0.7)
Eosinophils Relative: 2 % (ref 0–5)
HEMATOCRIT: 41.9 % (ref 36.0–46.0)
Hemoglobin: 14.7 g/dL (ref 12.0–15.0)
Lymphocytes Relative: 38 % (ref 12–46)
Lymphs Abs: 3.8 10*3/uL (ref 0.7–4.0)
MCH: 31.8 pg (ref 26.0–34.0)
MCHC: 35.1 g/dL (ref 30.0–36.0)
MCV: 90.7 fL (ref 78.0–100.0)
MONO ABS: 0.9 10*3/uL (ref 0.1–1.0)
Monocytes Relative: 9 % (ref 3–12)
NEUTROS ABS: 5.1 10*3/uL (ref 1.7–7.7)
Neutrophils Relative %: 50 % (ref 43–77)
Platelets: 255 10*3/uL (ref 150–400)
RBC: 4.62 MIL/uL (ref 3.87–5.11)
RDW: 13.1 % (ref 11.5–15.5)
WBC: 10.2 10*3/uL (ref 4.0–10.5)

## 2013-11-09 LAB — WET PREP, GENITAL
Clue Cells Wet Prep HPF POC: NONE SEEN
Trich, Wet Prep: NONE SEEN
YEAST WET PREP: NONE SEEN

## 2013-11-09 LAB — COMPREHENSIVE METABOLIC PANEL
ALBUMIN: 3.4 g/dL — AB (ref 3.5–5.2)
ALT: 6 U/L (ref 0–35)
ANION GAP: 12 (ref 5–15)
AST: 12 U/L (ref 0–37)
Alkaline Phosphatase: 86 U/L (ref 39–117)
BUN: 8 mg/dL (ref 6–23)
CO2: 26 meq/L (ref 19–32)
CREATININE: 0.7 mg/dL (ref 0.50–1.10)
Calcium: 9.2 mg/dL (ref 8.4–10.5)
Chloride: 103 mEq/L (ref 96–112)
GFR calc Af Amer: >90 mL/min (ref >90)
Glucose, Bld: 104 mg/dL — ABNORMAL HIGH (ref 70–99)
Potassium: 4 mEq/L (ref 3.7–5.3)
Sodium: 141 mEq/L (ref 137–147)
Total Protein: 7.1 g/dL (ref 6.0–8.3)

## 2013-11-09 LAB — LIPASE, BLOOD: LIPASE: 11 U/L (ref 11–59)

## 2013-11-09 MED ORDER — HYDROMORPHONE HCL 1 MG/ML IJ SOLN
1.0000 mg | Freq: Once | INTRAMUSCULAR | Status: AC
  Administered 2013-11-09: 1 mg via INTRAVENOUS
  Filled 2013-11-09: qty 1

## 2013-11-09 MED ORDER — IOHEXOL 300 MG/ML  SOLN
100.0000 mL | Freq: Once | INTRAMUSCULAR | Status: AC | PRN
  Administered 2013-11-09: 100 mL via INTRAVENOUS

## 2013-11-09 MED ORDER — ONDANSETRON HCL 4 MG/2ML IJ SOLN
4.0000 mg | Freq: Once | INTRAMUSCULAR | Status: AC
  Administered 2013-11-09: 4 mg via INTRAVENOUS
  Filled 2013-11-09: qty 2

## 2013-11-09 MED ORDER — SODIUM CHLORIDE 0.9 % IV SOLN
INTRAVENOUS | Status: DC
  Administered 2013-11-09: 20:00:00 via INTRAVENOUS

## 2013-11-09 MED ORDER — IOHEXOL 300 MG/ML  SOLN
50.0000 mL | Freq: Once | INTRAMUSCULAR | Status: AC | PRN
  Administered 2013-11-09: 50 mL via ORAL

## 2013-11-09 NOTE — ED Provider Notes (Signed)
CSN: [id number]     Arrival date & time 11/09/13  1729 History   First MD Initiated Contact with Patient 11/09/13 1831     Chief Complaint  Patient presents with  . Abdominal Pain     (Consider location/radiation/quality/duration/timing/severity/associated sxs/prior Treatment) Patient is a 48 y.o. female presenting with abdominal pain. The history is provided by the patient.  Abdominal Pain Pain location:  LLQ Pain quality: sharp and shooting   Pain radiates to:  L flank Pain severity:  Moderate Onset quality:  Gradual Duration:  2 days Timing:  Constant Progression:  Worsening Chronicity:  New Context: awakening from sleep   Relieved by:  Nothing Worsened by:  Movement and urination Associated symptoms: anorexia, diarrhea, nausea and vomiting   Associated symptoms: no chest pain, no chills, no constipation, no fever, no shortness of breath, no vaginal bleeding and no vaginal discharge    Patsi Searseresa L Schreckengost is a 48 y.o. female who presents to the ED with LLQ abdominal pain that started 2 days ago with n/v/d. She has been taking morphine for pain. She has RSD and takes the morphine as needed. [facility] for [facility]. She had done well until this pain started.  Past Medical History  Diagnosis Date  . RSD (reflex sympathetic dystrophy)     a. L shoulder  . Migraine   . Colon polyps     a. s/p colonoscopy and polypectomy in 2003  . Abdominal pain, chronic, left lower quadrant     a. s/p Laproscopy in 2004 w/o finding of source of discomfort.  [name]. GERD (gastroesophageal reflux disease)   . Hiatal hernia     a. small by EGD 2006.  [name]. Visual disturbance     a. 12/2011 Left eye visual changes->w/u for TIA->Carotid U/S:  No ICA stenosis; Head CT: No acute abnl; MRI/MRA: mild RPCA stenosis; Echo: EF 55-60%, Triv AI, No PFO.  . Tobacco abuse     a. 50 pack year hx.  . Cholelithiasis     a. s/p cholecystectomy 10/2011  . Chest pain     a. reportedly nl MV and echo  10/2011 - [place]  . Diverticulitis    Past Surgical History  Procedure Laterality Date  . Cesarean section    . Shoulder surgery    . Cholecystectomy      a. 10/2011  . Hernia repair     Family History  Problem Relation Age of Onset  . Dementia Father   . Parkinson's disease Father     alive @ 7475  . Heart attack Mother     died @ 667   History  Substance Use Topics  . Smoking status: Current Every Day Smoker -- 0.50 packs/day for 25 years    Types: Cigarettes  . Smokeless tobacco: Not on file     Comment: Smoked 2ppd for roughly 25 yrs but cut back to 1/2 ppd about 6 mos ago.  . Alcohol Use: No   OB History    No data available     Review of Systems  Constitutional: Negative for fever and chills.  HENT: Negative.   Eyes: Negative for itching and visual disturbance.  Respiratory: Negative for chest tightness and shortness of breath.   Cardiovascular: Negative for chest pain.  Gastrointestinal: Positive for nausea, vomiting, abdominal pain, diarrhea and anorexia. Negative for constipation and blood in stool.  Genitourinary: Positive for urgency, frequency and flank pain. Negative for vaginal bleeding, vaginal discharge  and dyspareunia.  Musculoskeletal: Positive for back pain.  Skin: Negative for rash.  Neurological: Negative for syncope and headaches.  Psychiatric/Behavioral: Negative for confusion. The patient is not nervous/anxious.       Allergies  Levaquin  Home Medications   Prior to Admission medications   Medication Sig Start Date End Date Taking? Authorizing Provider  aspirin 81 MG chewable tablet Chew 324 mg by mouth once.    Historical Provider, MD  cephALEXin (KEFLEX) 500 MG capsule Take 1 capsule (500 mg total) by mouth 4 (four) times daily. 09/21/13   Benny Lennert, MD  HYDROcodone-acetaminophen (NORCO/VICODIN) 5-325 MG per tablet Take 1 tablet by mouth once as needed for moderate pain.    Historical Provider, MD  lisinopril (PRINIVIL,ZESTRIL)  20 MG tablet Take 1 tablet (20 mg total) by mouth daily. 09/21/13   Benny Lennert, MD  morphine (MS CONTIN) 30 MG 12 hr tablet Take 30 mg by mouth 3 (three) times daily. 10/25/13 11/24/13  Historical Provider, MD  ondansetron (ZOFRAN ODT) 4 MG disintegrating tablet 4mg  ODT q4 hours prn nausea/vomit 09/21/13   Benny Lennert, MD  oxyCODONE-acetaminophen (PERCOCET/ROXICET) 5-325 MG per tablet Take 1 tablet by mouth every 6 (six) hours as needed. 09/21/13   Benny Lennert, MD   BP 165/115 mmHg  Pulse 103  Temp(Src) 97.9 F (36.6 C) (Oral)  Resp 16  Ht 5\' 3"  (1.6 m)  Wt 168 lb (76.204 kg)  BMI 29.77 kg/m2  SpO2 100% Physical Exam  Constitutional: She is oriented to person, place, and time. She appears well-developed and well-nourished. No distress.  HENT:  Head: Normocephalic and atraumatic.  Eyes: EOM are normal.  Neck: Neck supple.  Cardiovascular: Normal rate and regular rhythm.   Pulmonary/Chest: Effort normal and breath sounds normal.  Abdominal: Soft. Bowel sounds are normal. There is tenderness in the left lower quadrant. There is guarding. There is no rebound. CVA tenderness: left flank pain.  Genitourinary:  External genitalia without lesions, white discharge vaginal vault. Positive CMT, left adnexal tenderness, uterus without palpable enlargement.   Musculoskeletal: Normal range of motion.  Neurological: She is alert and oriented to person, place, and time. No cranial nerve deficit.  Skin: Skin is warm and dry.  Psychiatric: She has a normal mood and affect. Her behavior is normal.  Nursing note and vitals reviewed.   ED Course  Procedures ( Results for orders placed or performed during the hospital encounter of 11/09/13 (from the past 24 hour(s))  Urinalysis, Routine w reflex microscopic     Status: None   Collection Time: 11/09/13  5:52 PM  Result Value Ref Range   Color, Urine YELLOW YELLOW   APPearance CLEAR CLEAR   Specific Gravity, Urine 1.016 1.005 - 1.030   pH  6.5 5.0 - 8.0   Glucose, UA NEGATIVE NEGATIVE mg/dL   Hgb urine dipstick NEGATIVE NEGATIVE   Bilirubin Urine NEGATIVE NEGATIVE   Ketones, ur NEGATIVE NEGATIVE mg/dL   Protein, ur NEGATIVE NEGATIVE mg/dL   Urobilinogen, UA 1.0 0.0 - 1.0 mg/dL   Nitrite NEGATIVE NEGATIVE   Leukocytes, UA NEGATIVE NEGATIVE  CBC with Differential     Status: None   Collection Time: 11/09/13  7:30 PM  Result Value Ref Range   WBC 10.2 4.0 - 10.5 K/uL   RBC 4.62 3.87 - 5.11 MIL/uL   Hemoglobin 14.7 12.0 - 15.0 g/dL   HCT 45.4 09.8 - 11.9 %   MCV 90.7 78.0 - 100.0 fL   MCH  31.8 26.0 - 34.0 pg   MCHC 35.1 30.0 - 36.0 g/dL   RDW 16.113.1 09.611.5 - 04.515.5 %   Platelets 255 150 - 400 K/uL   Neutrophils Relative % 50 43 - 77 %   Neutro Abs 5.1 1.7 - 7.7 K/uL   Lymphocytes Relative 38 12 - 46 %   Lymphs Abs 3.8 0.7 - 4.0 K/uL   Monocytes Relative 9 3 - 12 %   Monocytes Absolute 0.9 0.1 - 1.0 K/uL   Eosinophils Relative 2 0 - 5 %   Eosinophils Absolute 0.2 0.0 - 0.7 K/uL   Basophils Relative 1 0 - 1 %   Basophils Absolute 0.1 0.0 - 0.1 K/uL  Comprehensive metabolic panel     Status: Abnormal   Collection Time: 11/09/13  7:30 PM  Result Value Ref Range   Sodium 141 137 - 147 mEq/L   Potassium 4.0 3.7 - 5.3 mEq/L   Chloride 103 96 - 112 mEq/L   CO2 26 19 - 32 mEq/L   Glucose, Bld 104 (H) 70 - 99 mg/dL   BUN 8 6 - 23 mg/dL   Creatinine, Ser 4.090.70 0.50 - 1.10 mg/dL   Calcium 9.2 8.4 - 81.110.5 mg/dL   Total Protein 7.1 6.0 - 8.3 g/dL   Albumin 3.4 (L) 3.5 - 5.2 g/dL   AST 12 0 - 37 U/L   ALT 6 0 - 35 U/L   Alkaline Phosphatase 86 39 - 117 U/L   Total Bilirubin <0.2 (L) 0.3 - 1.2 mg/dL   GFR calc non Af Amer >90 >90 mL/min   GFR calc Af Amer >90 >90 mL/min   Anion gap 12 5 - 15  Lipase, blood     Status: None   Collection Time: 11/09/13  7:30 PM  Result Value Ref Range   Lipase 11 11 - 59 U/L    MDM  48 y.o. female with abdominal pain, n/v x 2 days and hx of diverticulitis. Patient in CT. Care turned over to  Dr. Manus Gunningancour @ 2350.    [facility] Orlene OchM Neese, NP 11/09/13 [address]  Hope M Neese, NP 11/09/13 2328

## 2013-11-09 NOTE — ED Notes (Signed)
Pt c/o lower abd pain n/v/d x 2 days

## 2013-11-10 DIAGNOSIS — Z5181 Encounter for therapeutic drug level monitoring: Secondary | ICD-10-CM | POA: Diagnosis not present

## 2013-11-10 DIAGNOSIS — M545 Low back pain: Secondary | ICD-10-CM | POA: Diagnosis not present

## 2013-11-10 DIAGNOSIS — G894 Chronic pain syndrome: Secondary | ICD-10-CM | POA: Diagnosis not present

## 2013-11-10 DIAGNOSIS — R197 Diarrhea, unspecified: Secondary | ICD-10-CM | POA: Diagnosis not present

## 2013-11-10 DIAGNOSIS — Z79899 Other long term (current) drug therapy: Secondary | ICD-10-CM | POA: Diagnosis not present

## 2013-11-10 DIAGNOSIS — R112 Nausea with vomiting, unspecified: Secondary | ICD-10-CM | POA: Diagnosis not present

## 2013-11-10 DIAGNOSIS — M5417 Radiculopathy, lumbosacral region: Secondary | ICD-10-CM | POA: Diagnosis not present

## 2013-11-10 DIAGNOSIS — G90512 Complex regional pain syndrome I of left upper limb: Secondary | ICD-10-CM | POA: Diagnosis not present

## 2013-11-10 DIAGNOSIS — R102 Pelvic and perineal pain: Secondary | ICD-10-CM | POA: Diagnosis not present

## 2013-11-10 MED ORDER — ONDANSETRON HCL 4 MG PO TABS: 4.0000 mg | ORAL_TABLET | Freq: Four times a day (QID) | ORAL | Status: DC

## 2013-11-10 NOTE — Discharge Instructions (Signed)

## 2013-11-11 LAB — GC/CHLAMYDIA PROBE AMP
CT PROBE, AMP APTIMA: NEGATIVE
GC PROBE AMP APTIMA: NEGATIVE

## 2013-12-15 DIAGNOSIS — M79602 Pain in left arm: Secondary | ICD-10-CM | POA: Diagnosis not present

## 2013-12-15 DIAGNOSIS — Z79891 Long term (current) use of opiate analgesic: Secondary | ICD-10-CM | POA: Diagnosis not present

## 2013-12-15 DIAGNOSIS — Z9889 Other specified postprocedural states: Secondary | ICD-10-CM | POA: Diagnosis not present

## 2013-12-15 DIAGNOSIS — G90519 Complex regional pain syndrome I of unspecified upper limb: Secondary | ICD-10-CM | POA: Diagnosis not present

## 2013-12-15 DIAGNOSIS — R109 Unspecified abdominal pain: Secondary | ICD-10-CM | POA: Diagnosis not present

## 2013-12-15 DIAGNOSIS — Z888 Allergy status to other drugs, medicaments and biological substances status: Secondary | ICD-10-CM | POA: Diagnosis not present

## 2013-12-15 DIAGNOSIS — Z79899 Other long term (current) drug therapy: Secondary | ICD-10-CM | POA: Diagnosis not present

## 2013-12-15 DIAGNOSIS — G90512 Complex regional pain syndrome I of left upper limb: Secondary | ICD-10-CM | POA: Diagnosis not present

## 2013-12-15 DIAGNOSIS — Z7982 Long term (current) use of aspirin: Secondary | ICD-10-CM | POA: Diagnosis not present

## 2013-12-15 DIAGNOSIS — F419 Anxiety disorder, unspecified: Secondary | ICD-10-CM | POA: Diagnosis not present

## 2013-12-15 DIAGNOSIS — M5417 Radiculopathy, lumbosacral region: Secondary | ICD-10-CM | POA: Diagnosis not present

## 2013-12-15 DIAGNOSIS — R079 Chest pain, unspecified: Secondary | ICD-10-CM | POA: Diagnosis not present

## 2013-12-15 DIAGNOSIS — M79606 Pain in leg, unspecified: Secondary | ICD-10-CM | POA: Diagnosis not present

## 2013-12-15 DIAGNOSIS — E785 Hyperlipidemia, unspecified: Secondary | ICD-10-CM | POA: Diagnosis not present

## 2013-12-15 DIAGNOSIS — I10 Essential (primary) hypertension: Secondary | ICD-10-CM | POA: Diagnosis not present

## 2013-12-15 DIAGNOSIS — K5790 Diverticulosis of intestine, part unspecified, without perforation or abscess without bleeding: Secondary | ICD-10-CM | POA: Diagnosis not present

## 2013-12-15 DIAGNOSIS — Z720 Tobacco use: Secondary | ICD-10-CM | POA: Diagnosis not present

## 2013-12-15 DIAGNOSIS — G894 Chronic pain syndrome: Secondary | ICD-10-CM | POA: Diagnosis not present

## 2013-12-15 DIAGNOSIS — K8020 Calculus of gallbladder without cholecystitis without obstruction: Secondary | ICD-10-CM | POA: Diagnosis not present

## 2013-12-24 DIAGNOSIS — F17210 Nicotine dependence, cigarettes, uncomplicated: Secondary | ICD-10-CM | POA: Diagnosis not present

## 2013-12-24 DIAGNOSIS — M545 Low back pain: Secondary | ICD-10-CM | POA: Diagnosis not present

## 2013-12-24 DIAGNOSIS — E785 Hyperlipidemia, unspecified: Secondary | ICD-10-CM | POA: Diagnosis not present

## 2013-12-24 DIAGNOSIS — G894 Chronic pain syndrome: Secondary | ICD-10-CM | POA: Diagnosis not present

## 2013-12-24 DIAGNOSIS — F419 Anxiety disorder, unspecified: Secondary | ICD-10-CM | POA: Diagnosis not present

## 2013-12-24 DIAGNOSIS — I10 Essential (primary) hypertension: Secondary | ICD-10-CM | POA: Diagnosis not present

## 2013-12-24 DIAGNOSIS — Z8711 Personal history of peptic ulcer disease: Secondary | ICD-10-CM | POA: Diagnosis not present

## 2013-12-24 DIAGNOSIS — M5417 Radiculopathy, lumbosacral region: Secondary | ICD-10-CM | POA: Diagnosis not present

## 2014-01-10 DIAGNOSIS — M79602 Pain in left arm: Secondary | ICD-10-CM | POA: Diagnosis not present

## 2014-01-10 DIAGNOSIS — R51 Headache: Secondary | ICD-10-CM | POA: Diagnosis not present

## 2014-01-10 DIAGNOSIS — Z888 Allergy status to other drugs, medicaments and biological substances status: Secondary | ICD-10-CM | POA: Diagnosis not present

## 2014-01-10 DIAGNOSIS — F419 Anxiety disorder, unspecified: Secondary | ICD-10-CM | POA: Diagnosis not present

## 2014-01-10 DIAGNOSIS — G90519 Complex regional pain syndrome I of unspecified upper limb: Secondary | ICD-10-CM | POA: Diagnosis not present

## 2014-01-10 DIAGNOSIS — Z720 Tobacco use: Secondary | ICD-10-CM | POA: Diagnosis not present

## 2014-01-10 DIAGNOSIS — M79606 Pain in leg, unspecified: Secondary | ICD-10-CM | POA: Diagnosis not present

## 2014-01-10 DIAGNOSIS — E785 Hyperlipidemia, unspecified: Secondary | ICD-10-CM | POA: Diagnosis not present

## 2014-01-10 DIAGNOSIS — G894 Chronic pain syndrome: Secondary | ICD-10-CM | POA: Diagnosis not present

## 2014-01-10 DIAGNOSIS — G90512 Complex regional pain syndrome I of left upper limb: Secondary | ICD-10-CM | POA: Diagnosis not present

## 2014-01-10 DIAGNOSIS — Z8371 Family history of colonic polyps: Secondary | ICD-10-CM | POA: Diagnosis not present

## 2014-01-10 DIAGNOSIS — K8020 Calculus of gallbladder without cholecystitis without obstruction: Secondary | ICD-10-CM | POA: Diagnosis not present

## 2014-01-10 DIAGNOSIS — Z8711 Personal history of peptic ulcer disease: Secondary | ICD-10-CM | POA: Diagnosis not present

## 2014-01-10 DIAGNOSIS — R079 Chest pain, unspecified: Secondary | ICD-10-CM | POA: Diagnosis not present

## 2014-01-10 DIAGNOSIS — K5790 Diverticulosis of intestine, part unspecified, without perforation or abscess without bleeding: Secondary | ICD-10-CM | POA: Diagnosis not present

## 2014-01-10 DIAGNOSIS — R109 Unspecified abdominal pain: Secondary | ICD-10-CM | POA: Diagnosis not present

## 2014-01-10 DIAGNOSIS — M5417 Radiculopathy, lumbosacral region: Secondary | ICD-10-CM | POA: Diagnosis not present

## 2014-01-10 DIAGNOSIS — I10 Essential (primary) hypertension: Secondary | ICD-10-CM | POA: Diagnosis not present

## 2014-01-10 DIAGNOSIS — Z9049 Acquired absence of other specified parts of digestive tract: Secondary | ICD-10-CM | POA: Diagnosis not present

## 2014-01-10 DIAGNOSIS — Z9889 Other specified postprocedural states: Secondary | ICD-10-CM | POA: Diagnosis not present

## 2014-01-11 ENCOUNTER — Encounter (HOSPITAL_BASED_OUTPATIENT_CLINIC_OR_DEPARTMENT_OTHER): Payer: Self-pay | Admitting: Emergency Medicine

## 2014-01-11 ENCOUNTER — Emergency Department (HOSPITAL_BASED_OUTPATIENT_CLINIC_OR_DEPARTMENT_OTHER): Payer: Medicare Other

## 2014-01-11 ENCOUNTER — Emergency Department (HOSPITAL_BASED_OUTPATIENT_CLINIC_OR_DEPARTMENT_OTHER)
Admission: EM | Admit: 2014-01-11 | Discharge: 2014-01-12 | Disposition: A | Discharge status: Home / Self Care | Payer: Medicare Other | Attending: Emergency Medicine | Admitting: Emergency Medicine

## 2014-01-11 DIAGNOSIS — R1032 Left lower quadrant pain: Secondary | ICD-10-CM | POA: Insufficient documentation

## 2014-01-11 DIAGNOSIS — R0789 Other chest pain: Secondary | ICD-10-CM | POA: Insufficient documentation

## 2014-01-11 DIAGNOSIS — G43909 Migraine, unspecified, not intractable, without status migrainosus: Secondary | ICD-10-CM | POA: Insufficient documentation

## 2014-01-11 DIAGNOSIS — Z792 Long term (current) use of antibiotics: Secondary | ICD-10-CM | POA: Insufficient documentation

## 2014-01-11 DIAGNOSIS — Z720 Tobacco use: Secondary | ICD-10-CM | POA: Insufficient documentation

## 2014-01-11 DIAGNOSIS — R1110 Vomiting, unspecified: Secondary | ICD-10-CM | POA: Diagnosis not present

## 2014-01-11 DIAGNOSIS — Z8719 Personal history of other diseases of the digestive system: Secondary | ICD-10-CM | POA: Diagnosis not present

## 2014-01-11 DIAGNOSIS — Z7982 Long term (current) use of aspirin: Secondary | ICD-10-CM | POA: Insufficient documentation

## 2014-01-11 DIAGNOSIS — R079 Chest pain, unspecified: Secondary | ICD-10-CM

## 2014-01-11 DIAGNOSIS — Z8669 Personal history of other diseases of the nervous system and sense organs: Secondary | ICD-10-CM | POA: Insufficient documentation

## 2014-01-11 DIAGNOSIS — R197 Diarrhea, unspecified: Secondary | ICD-10-CM | POA: Diagnosis not present

## 2014-01-11 DIAGNOSIS — Z9889 Other specified postprocedural states: Secondary | ICD-10-CM | POA: Insufficient documentation

## 2014-01-11 DIAGNOSIS — Z3202 Encounter for pregnancy test, result negative: Secondary | ICD-10-CM | POA: Diagnosis not present

## 2014-01-11 DIAGNOSIS — Z79899 Other long term (current) drug therapy: Secondary | ICD-10-CM | POA: Diagnosis not present

## 2014-01-11 DIAGNOSIS — Z86010 Personal history of colonic polyps: Secondary | ICD-10-CM | POA: Insufficient documentation

## 2014-01-11 DIAGNOSIS — Z9089 Acquired absence of other organs: Secondary | ICD-10-CM | POA: Diagnosis not present

## 2014-01-11 DIAGNOSIS — K5730 Diverticulosis of large intestine without perforation or abscess without bleeding: Secondary | ICD-10-CM | POA: Diagnosis not present

## 2014-01-11 DIAGNOSIS — G8929 Other chronic pain: Secondary | ICD-10-CM | POA: Diagnosis not present

## 2014-01-11 DIAGNOSIS — R112 Nausea with vomiting, unspecified: Secondary | ICD-10-CM | POA: Diagnosis not present

## 2014-01-11 DIAGNOSIS — R000 Tachycardia, unspecified: Secondary | ICD-10-CM | POA: Diagnosis not present

## 2014-01-11 LAB — I-STAT TROPONIN, ED: Troponin i, poc: 0 ng/mL (ref 0.00–0.08)

## 2014-01-11 LAB — COMPREHENSIVE METABOLIC PANEL
ALT: 12 U/L (ref 0–35)
AST: 13 U/L (ref 0–37)
Albumin: 4.2 g/dL (ref 3.5–5.2)
Alkaline Phosphatase: 75 U/L (ref 39–117)
Anion gap: 9 (ref 5–15)
BILIRUBIN TOTAL: 0.7 mg/dL (ref 0.3–1.2)
BUN: 14 mg/dL (ref 6–23)
CHLORIDE: 105 meq/L (ref 96–112)
CO2: 25 mmol/L (ref 19–32)
CREATININE: 0.77 mg/dL (ref 0.50–1.10)
Calcium: 9.3 mg/dL (ref 8.4–10.5)
GFR calc Af Amer: >90 mL/min (ref >90)
Glucose, Bld: 118 mg/dL — ABNORMAL HIGH (ref 70–99)
Potassium: 3.3 mmol/L — ABNORMAL LOW (ref 3.5–5.1)
SODIUM: 139 mmol/L (ref 135–145)
Total Protein: 7.8 g/dL (ref 6.0–8.3)

## 2014-01-11 LAB — URINALYSIS, ROUTINE W REFLEX MICROSCOPIC
Glucose, UA: NEGATIVE mg/dL
Hgb urine dipstick: NEGATIVE
KETONES UR: 15 mg/dL — AB
Leukocytes, UA: NEGATIVE
NITRITE: NEGATIVE
PH: 5.5 (ref 5.0–8.0)
Protein, ur: 30 mg/dL — AB
SPECIFIC GRAVITY, URINE: 1.03 (ref 1.005–1.030)
UROBILINOGEN UA: 1 mg/dL (ref 0.0–1.0)

## 2014-01-11 LAB — CBC WITH DIFFERENTIAL/PLATELET
BASOS ABS: 0.1 10*3/uL (ref 0.0–0.1)
Basophils Relative: 1 % (ref 0–1)
EOS PCT: 1 % (ref 0–5)
Eosinophils Absolute: 0.1 10*3/uL (ref 0.0–0.7)
HEMATOCRIT: 47.8 % — AB (ref 36.0–46.0)
HEMOGLOBIN: 16.4 g/dL — AB (ref 12.0–15.0)
Lymphocytes Relative: 23 % (ref 12–46)
Lymphs Abs: 2.7 10*3/uL (ref 0.7–4.0)
MCH: 31.6 pg (ref 26.0–34.0)
MCHC: 34.3 g/dL (ref 30.0–36.0)
MCV: 92.1 fL (ref 78.0–100.0)
MONOS PCT: 6 % (ref 3–12)
Monocytes Absolute: 0.8 10*3/uL (ref 0.1–1.0)
Neutro Abs: 8.4 10*3/uL — ABNORMAL HIGH (ref 1.7–7.7)
Neutrophils Relative %: 69 % (ref 43–77)
Platelets: 241 10*3/uL (ref 150–400)
RBC: 5.19 MIL/uL — ABNORMAL HIGH (ref 3.87–5.11)
RDW: 15.2 % (ref 11.5–15.5)
WBC: 12 10*3/uL — ABNORMAL HIGH (ref 4.0–10.5)

## 2014-01-11 LAB — URINE MICROSCOPIC-ADD ON

## 2014-01-11 LAB — TROPONIN I: Troponin I: <0.03 ng/mL (ref <0.031)

## 2014-01-11 LAB — PREGNANCY, URINE: PREG TEST UR: NEGATIVE

## 2014-01-11 LAB — LIPASE, BLOOD: Lipase: 31 U/L (ref 11–59)

## 2014-01-11 MED ORDER — HYDROMORPHONE HCL 1 MG/ML IJ SOLN
1.0000 mg | Freq: Once | INTRAMUSCULAR | Status: AC
Start: 2014-01-11 — End: 2014-01-11
  Administered 2014-01-11: 1 mg via INTRAVENOUS
  Filled 2014-01-11: qty 1

## 2014-01-11 MED ORDER — IOHEXOL 300 MG/ML  SOLN
100.0000 mL | Freq: Once | INTRAMUSCULAR | Status: AC | PRN
  Administered 2014-01-11: 100 mL via INTRAVENOUS

## 2014-01-11 MED ORDER — SODIUM CHLORIDE 0.9 % IV BOLUS (SEPSIS)
1000.0000 mL | Freq: Once | INTRAVENOUS | Status: AC
  Administered 2014-01-11: 1000 mL via INTRAVENOUS

## 2014-01-11 MED ORDER — SODIUM CHLORIDE 0.9 % IV BOLUS (SEPSIS)
500.0000 mL | Freq: Once | INTRAVENOUS | Status: AC
  Administered 2014-01-11: 500 mL via INTRAVENOUS

## 2014-01-11 MED ORDER — ASPIRIN 81 MG PO CHEW
324.0000 mg | CHEWABLE_TABLET | Freq: Once | ORAL | Status: AC
  Administered 2014-01-11: 324 mg via ORAL
  Filled 2014-01-11: qty 4

## 2014-01-11 MED ORDER — PROMETHAZINE HCL 25 MG/ML IJ SOLN
12.5000 mg | Freq: Once | INTRAMUSCULAR | Status: AC
  Administered 2014-01-11: 12.5 mg via INTRAVENOUS
  Filled 2014-01-11: qty 1

## 2014-01-11 MED ORDER — IOHEXOL 300 MG/ML  SOLN
25.0000 mL | Freq: Once | INTRAMUSCULAR | Status: AC | PRN
  Administered 2014-01-11: 25 mL via ORAL

## 2014-01-11 MED ORDER — HYDROMORPHONE HCL 1 MG/ML IJ SOLN
1.0000 mg | Freq: Once | INTRAMUSCULAR | Status: AC
  Administered 2014-01-11: 1 mg via INTRAVENOUS
  Filled 2014-01-11: qty 1

## 2014-01-11 MED ORDER — ONDANSETRON HCL 4 MG/2ML IJ SOLN
4.0000 mg | Freq: Once | INTRAMUSCULAR | Status: AC
  Administered 2014-01-11: 4 mg via INTRAVENOUS
  Filled 2014-01-11: qty 2

## 2014-01-11 NOTE — ED Notes (Signed)
49 yo with sudden onset abdominal pain. Has N/V/D x 1 day. Unable to tolerate solid foods at this time. Denies Fever/Chills. Took pepto bismal w/ no relief. A/O X3 and ambulatory.

## 2014-01-11 NOTE — ED Notes (Signed)
abd pain w n/v, diarrhea x 2 days

## 2014-01-11 NOTE — ED Notes (Signed)
Patient transported to CT 

## 2014-01-11 NOTE — ED Provider Notes (Signed)
CSN: [id number]     Arrival date & time 01/11/14  1755 History   First MD Initiated Contact with Patient 01/11/14 1814     Chief Complaint  Patient presents with  . Abdominal Pain     (Consider location/radiation/quality/duration/timing/severity/associated sxs/prior Treatment) HPI Comments: The patient is a 49 year old female with a past history of migraines, diverticulitis, tobacco abuse presenting to emergency room chief complaint of left lower abdominal discomfort since last night and chest pain since today.  Patient reports left lower quadrant discomfort is sharp, constant, denies aggravating or relieving factors.  Reports associated diarrhea, 20 episodes since last night and one episode of nonbloody emesis today. Patient reports similar symptoms with previous flares of diverticulitis. No GI specialist. Patient also complains of persistent chest tightness since approximately 041 5 PM today. Onset of discomfort while seated on couch. She reports mild lower extremity edema, denies palpitations or associated shortness of breath. Last stress test several years ago. Reports positive family history of early MI and mother and father.  Patient is a 49 y.o. female presenting with abdominal pain. The history is provided by the patient. No language interpreter was used.  Abdominal Pain   Past Medical History  Diagnosis Date  . RSD (reflex sympathetic dystrophy)     a. L shoulder  . Migraine   . Colon polyps     a. s/p colonoscopy and polypectomy in 2003  . Abdominal pain, chronic, left lower quadrant     a. s/p Laproscopy in 2004 w/o finding of source of discomfort.  [name] GERD (gastroesophageal reflux disease)   . Hiatal hernia     a. small by EGD 2006.  [name] Visual disturbance     a. 12/2011 Left eye visual changes->w/u for TIA->Carotid U/S:  No ICA stenosis; Head CT: No acute abnl; MRI/MRA: mild RPCA stenosis; Echo: EF 55-60%, Triv AI, No PFO.  . Tobacco abuse     a. 50 pack year hx.  .  Cholelithiasis     a. s/p cholecystectomy 10/2011  . Chest pain     a. reportedly nl MV and echo 10/2011 - [place]  . Diverticulitis    Past Surgical History  Procedure Laterality Date  . Cesarean section    . Shoulder surgery    . Cholecystectomy      a. 10/2011  . Hernia repair     Family History  Problem Relation Age of Onset  . Dementia Father   . Parkinson's disease Father     alive @ 29  . Heart attack Mother     died @ 19   History  Substance Use Topics  . Smoking status: Current Every Day Smoker -- 0.50 packs/day for 25 years    Types: Cigarettes  . Smokeless tobacco: Not on file     Comment: Smoked 2ppd for roughly 25 yrs but cut back to 1/2 ppd about 6 mos ago.  . Alcohol Use: No   OB History    No data available     Review of Systems  Gastrointestinal: Positive for abdominal pain.      Allergies  Levaquin  Home Medications   Prior to Admission medications   Medication Sig Start Date End Date Taking? Authorizing Provider  aspirin 81 MG chewable tablet Chew 324 mg by mouth once.    Historical Provider, MD  cephALEXin (KEFLEX) 500 MG capsule Take 1 capsule (500 mg total) by mouth 4 (four) times daily. 09/21/13   Benny Lennert, MD  HYDROcodone-acetaminophen (NORCO/VICODIN) [phone]  MG per tablet Take 1 tablet by mouth once as needed for moderate pain.    Historical Provider, MD  lisinopril (PRINIVIL,ZESTRIL) 20 MG tablet Take 1 tablet (20 mg total) by mouth daily. 09/21/13   Benny LennertJoseph L Zammit, MD  ondansetron (ZOFRAN ODT) 4 MG disintegrating tablet 4mg  ODT q4 hours prn nausea/vomit 09/21/13   Benny LennertJoseph L Zammit, MD  ondansetron (ZOFRAN) 4 MG tablet Take 1 tablet (4 mg total) by mouth every 6 (six) hours. 11/10/13   Glynn OctaveStephen Rancour, MD  oxyCODONE-acetaminophen (PERCOCET/ROXICET) 5-325 MG per tablet Take 1 tablet by mouth every 6 (six) hours as needed. 09/21/13   Benny LennertJoseph L Zammit, MD   BP 168/103 mmHg  Temp(Src) 98.5 F (36.9 C) (Oral)  Resp 18  Ht 5\' 3"  (1.6  m)  Wt 173 lb (78.472 kg)  BMI 30.65 kg/m2  SpO2 99% Physical Exam  Constitutional: She is oriented to person, place, and time. She appears well-developed and well-nourished. No distress.  HENT:  Head: Normocephalic and atraumatic.  Eyes: EOM are normal.  Neck: Neck supple.  Cardiovascular: Regular rhythm.  Tachycardia present.   No lower extremity edema, no calf tenderness.  Pulmonary/Chest: Effort normal. No respiratory distress.  Abdominal: Soft. There is tenderness.  Neurological: She is alert and oriented to person, place, and time.  Skin: Skin is warm and dry. She is not diaphoretic.  Psychiatric: She has a normal mood and affect. Her behavior is normal.  Nursing note and vitals reviewed.   ED Course  Procedures (including critical care time) Labs Review Labs Reviewed  CBC WITH DIFFERENTIAL - Abnormal; Notable for the following:    WBC 12.0 (*)    RBC 5.19 (*)    Hemoglobin 16.4 (*)    HCT 47.8 (*)    Neutro Abs 8.4 (*)    All other components within normal limits  COMPREHENSIVE METABOLIC PANEL - Abnormal; Notable for the following:    Potassium 3.3 (*)    Glucose, Bld 118 (*)    All other components within normal limits  URINALYSIS, ROUTINE W REFLEX MICROSCOPIC - Abnormal; Notable for the following:    Color, Urine AMBER (*)    APPearance CLOUDY (*)    Bilirubin Urine SMALL (*)    Ketones, ur 15 (*)    Protein, ur 30 (*)    All other components within normal limits  URINE MICROSCOPIC-ADD ON - Abnormal; Notable for the following:    Squamous Epithelial / LPF FEW (*)    All other components within normal limits  LIPASE, BLOOD  PREGNANCY, URINE  TROPONIN I    Imaging Review Dg Chest 2 View  01/11/2014   CLINICAL DATA:  Chest pain.  Nausea vomiting and diarrhea.  EXAM: CHEST  2 VIEW  COMPARISON:  12/08/2012  FINDINGS: The cardiomediastinal contours are normal. The lungs are clear. Pulmonary vasculature is normal. No consolidation, pleural effusion, or  pneumothorax. No acute osseous abnormalities are seen.  IMPRESSION: No acute pulmonary process.   Electronically Signed   By: Rubye OaksMelanie  Ehinger M.D.   On: 01/11/2014 19:50   Ct Abdomen Pelvis W Contrast  01/11/2014   CLINICAL DATA:  LLQ abdominal pain with N/V/D x today. HX: Diverticulitis, Hiata Hernia, Hernia Repair, Colon Polyps, Cholecystectomy  EXAM: CT ABDOMEN AND PELVIS WITH CONTRAST  TECHNIQUE: Multidetector CT imaging of the abdomen and pelvis was performed using the standard protocol following bolus administration of intravenous contrast.  CONTRAST:  25mL OMNIPAQUE IOHEXOL 300 MG/ML SOLN, 100mL OMNIPAQUE IOHEXOL 300 MG/ML SOLN  COMPARISON:  11/09/2013  FINDINGS: Minimal dependent atelectasis in the visualized lung bases. Surgical clips in the gallbladder fossa. Unremarkable liver, spleen, adrenal glands, kidneys, pancreas, portal vein. No hydronephrosis. Scattered aortoiliac calcified plaque without aneurysm. Stomach is physiologically distended. There a few mildly distended small bowel loops in the upper abdomen, distal small bowel nondilated, with distal progression of oral contrast into the colon which is also nondilated. Normal appendix. Scattered distal descending and sigmoid diverticula without significant adjacent inflammatory/edematous change. Urinary bladder is nondistended. Uterus and adnexal regions unremarkable. Small bilateral pelvic phleboliths. No ascites. No adenopathy. No free air. Minimal spurring in the lumbar spine.  IMPRESSION: 1. No acute abdominal process. 2. Descending and sigmoid diverticulosis without CT evidence of diverticulitis or abscess.   Electronically Signed   By: Oley Balm M.D.   On: 01/11/2014 20:13     EKG Interpretation   Date/Time:  Tuesday January 11 2014 18:19:11 EST Ventricular Rate:  104 PR Interval:  128 QRS Duration: 74 QT Interval:  332 QTC Calculation: 436 R Axis:   77 Text Interpretation:  Sinus tachycardia Right atrial enlargement   Borderline ECG rate has increased since last tracing Confirmed by Mirian Mo [phone]) on 01/11/2014 6:24:46 PM      MDM   Final diagnoses:  Chest pain  LLQ pain  LLQ pain   Patient presents with left lower quadrant discomfort, multiple episodes of diarrhea, history of diverticulitis plan to CT to rule out abscess. Patient also complains of chest discomfort/ atypical chest pain, plan to rule out ACS. 1830  Re-eval pt appers less anxious and resting in room.  Reports just receiving medication without relief. CT without acute diverticulitis. 1930  Re-eval pt resting in room.  Denies relief with medication. Pt care assumed by Dr. Littie Deeds at shift change.  Awaiting second troponin likely d/c home.  Mellody Drown, PA-C 01/12/14 1554  Mirian Mo, MD 01/14/14 [phone]

## 2014-01-12 LAB — I-STAT TROPONIN, ED: TROPONIN I, POC: 0 ng/mL (ref 0.00–0.08)

## 2014-01-12 NOTE — Discharge Instructions (Signed)
Abdominal Pain °Many things can cause abdominal pain. Usually, abdominal pain is not caused by a disease and will improve without treatment. It can often be observed and treated at home. Your health care provider will do a physical exam and possibly order blood tests and X-rays to help determine the seriousness of your pain. However, in many cases, more time must pass before a clear cause of the pain can be found. Before that point, your health care provider may not know if you need more testing or further treatment. °HOME CARE INSTRUCTIONS  °Monitor your abdominal pain for any changes. The following actions may help to alleviate any discomfort you are experiencing: °· Only take over-the-counter or prescription medicines as directed by your health care provider. °· Do not take laxatives unless directed to do so by your health care provider. °· Try a clear liquid diet (broth, tea, or water) as directed by your health care provider. Slowly move to a bland diet as tolerated. °SEEK MEDICAL CARE IF: °· You have unexplained abdominal pain. °· You have abdominal pain associated with nausea or diarrhea. °· You have pain when you urinate or have a bowel movement. °· You experience abdominal pain that wakes you in the night. °· You have abdominal pain that is worsened or improved by eating food. °· You have abdominal pain that is worsened with eating fatty foods. °· You have a fever. °SEEK IMMEDIATE MEDICAL CARE IF:  °· Your pain does not go away within 2 hours. °· You keep throwing up (vomiting). °· Your pain is felt only in portions of the abdomen, such as the right side or the left lower portion of the abdomen. °· You pass bloody or black tarry stools. °MAKE SURE YOU: °· Understand these instructions.   °· Will watch your condition.   °· Will get help right away if you are not doing well or get worse.   °Document Released: 10/03/2004 Document Revised: 12/29/2012 Document Reviewed: 09/02/2012 °[employer]® Patient Information  ©2015 [employer]. This information is not intended to replace advice given to you by your health care provider. Make sure you discuss any questions you have with your health care provider. ° °Chest Pain (Nonspecific) °It is often hard to give a specific diagnosis for the cause of chest pain. There is always a chance that your pain could be related to something serious, such as a heart attack or a blood clot in the lungs. You need to follow up with your health care provider for further evaluation. °CAUSES  °· Heartburn. °· Pneumonia or bronchitis. °· Anxiety or stress. °· Inflammation around your heart (pericarditis) or lung (pleuritis or pleurisy). °· A blood clot in the lung. °· A collapsed lung (pneumothorax). It can develop suddenly on its own (spontaneous pneumothorax) or from trauma to the chest. °· Shingles infection (herpes zoster virus). °The chest wall is composed of bones, muscles, and cartilage. Any of these can be the source of the pain. °· The bones can be bruised by injury. °· The muscles or cartilage can be strained by coughing or overwork. °· The cartilage can be affected by inflammation and become sore (costochondritis). °DIAGNOSIS  °Lab tests or other studies may be needed to find the cause of your pain. Your health care provider may have you take a test called an ambulatory electrocardiogram (ECG). An ECG records your heartbeat patterns over a 24-hour period. You may also have other tests, such as: °· Transthoracic echocardiogram (TTE). During echocardiography, sound waves are used to evaluate how blood   flows through your heart. °· Transesophageal echocardiogram (TEE). °· Cardiac monitoring. This allows your health care provider to monitor your heart rate and rhythm in real time. °· Holter monitor. This is a portable device that records your heartbeat and can help diagnose heart arrhythmias. It allows your health care provider to track your heart activity for several days, if needed. °· Stress  tests by exercise or by giving medicine that makes the heart beat faster. °TREATMENT  °· Treatment depends on what may be causing your chest pain. Treatment may include: °¨ Acid blockers for heartburn. °¨ Anti-inflammatory medicine. °¨ Pain medicine for inflammatory conditions. °¨ Antibiotics if an infection is present. °· You may be advised to change lifestyle habits. This includes stopping smoking and avoiding alcohol, caffeine, and chocolate. °· You may be advised to keep your head raised (elevated) when sleeping. This reduces the chance of acid going backward from your stomach into your esophagus. °Most of the time, nonspecific chest pain will improve within 2-3 days with rest and mild pain medicine.  °HOME CARE INSTRUCTIONS  °· If antibiotics were prescribed, take them as directed. Finish them even if you start to feel better. °· For the next few days, avoid physical activities that bring on chest pain. Continue physical activities as directed. °· Do not use any tobacco products, including cigarettes, chewing tobacco, or electronic cigarettes. °· Avoid drinking alcohol. °· Only take medicine as directed by your health care provider. °· Follow your health care provider's suggestions for further testing if your chest pain does not go away. °· Keep any follow-up appointments you made. If you do not go to an appointment, you could develop lasting (chronic) problems with pain. If there is any problem keeping an appointment, call to reschedule. °SEEK MEDICAL CARE IF:  °· Your chest pain does not go away, even after treatment. °· You have a rash with blisters on your chest. °· You have a fever. °SEEK IMMEDIATE MEDICAL CARE IF:  °· You have increased chest pain or pain that spreads to your arm, neck, jaw, back, or abdomen. °· You have shortness of breath. °· You have an increasing cough, or you cough up blood. °· You have severe back or abdominal pain. °· You feel nauseous or vomit. °· You have severe weakness. °· You  faint. °· You have chills. °This is an emergency. Do not wait to see if the pain will go away. Get medical help at once. Call your local emergency services (911 in [place] Do not drive yourself to the hospital. °MAKE SURE YOU:  °· Understand these instructions. °· Will watch your condition. °· Will get help right away if you are not doing well or get worse. °Document Released: 10/03/2004 Document Revised: 12/29/2012 Document Reviewed: 07/30/2007 °[employer]® Patient Information ©2015 [employer]. This information is not intended to replace advice given to you by your health care provider. Make sure you discuss any questions you have with your health care provider. ° °

## 2014-02-22 ENCOUNTER — Emergency Department (HOSPITAL_BASED_OUTPATIENT_CLINIC_OR_DEPARTMENT_OTHER): Payer: Medicare Other

## 2014-02-22 ENCOUNTER — Encounter (HOSPITAL_BASED_OUTPATIENT_CLINIC_OR_DEPARTMENT_OTHER): Payer: Self-pay | Admitting: Emergency Medicine

## 2014-02-22 ENCOUNTER — Emergency Department (HOSPITAL_BASED_OUTPATIENT_CLINIC_OR_DEPARTMENT_OTHER)
Admission: EM | Admit: 2014-02-22 | Discharge: 2014-02-22 | Disposition: A | Discharge status: Home / Self Care | Payer: Medicare Other | Attending: Emergency Medicine | Admitting: Emergency Medicine

## 2014-02-22 DIAGNOSIS — Z7982 Long term (current) use of aspirin: Secondary | ICD-10-CM | POA: Insufficient documentation

## 2014-02-22 DIAGNOSIS — Z8669 Personal history of other diseases of the nervous system and sense organs: Secondary | ICD-10-CM | POA: Diagnosis not present

## 2014-02-22 DIAGNOSIS — G43909 Migraine, unspecified, not intractable, without status migrainosus: Secondary | ICD-10-CM | POA: Diagnosis not present

## 2014-02-22 DIAGNOSIS — K5732 Diverticulitis of large intestine without perforation or abscess without bleeding: Secondary | ICD-10-CM

## 2014-02-22 DIAGNOSIS — Z86010 Personal history of colonic polyps: Secondary | ICD-10-CM | POA: Insufficient documentation

## 2014-02-22 DIAGNOSIS — G8929 Other chronic pain: Secondary | ICD-10-CM | POA: Insufficient documentation

## 2014-02-22 DIAGNOSIS — K5730 Diverticulosis of large intestine without perforation or abscess without bleeding: Secondary | ICD-10-CM | POA: Diagnosis not present

## 2014-02-22 DIAGNOSIS — K209 Esophagitis, unspecified without bleeding: Secondary | ICD-10-CM

## 2014-02-22 DIAGNOSIS — J986 Disorders of diaphragm: Secondary | ICD-10-CM | POA: Diagnosis not present

## 2014-02-22 DIAGNOSIS — Z792 Long term (current) use of antibiotics: Secondary | ICD-10-CM | POA: Insufficient documentation

## 2014-02-22 DIAGNOSIS — R197 Diarrhea, unspecified: Secondary | ICD-10-CM | POA: Diagnosis present

## 2014-02-22 DIAGNOSIS — Z720 Tobacco use: Secondary | ICD-10-CM | POA: Insufficient documentation

## 2014-02-22 DIAGNOSIS — R0789 Other chest pain: Secondary | ICD-10-CM | POA: Insufficient documentation

## 2014-02-22 LAB — COMPREHENSIVE METABOLIC PANEL
ALT: 12 U/L (ref 0–35)
AST: 18 U/L (ref 0–37)
Albumin: 4.4 g/dL (ref 3.5–5.2)
Alkaline Phosphatase: 72 U/L (ref 39–117)
Anion gap: 6 (ref 5–15)
BILIRUBIN TOTAL: 0.6 mg/dL (ref 0.3–1.2)
BUN: 21 mg/dL (ref 6–23)
CO2: 25 mmol/L (ref 19–32)
Calcium: 9.3 mg/dL (ref 8.4–10.5)
Chloride: 107 mmol/L (ref 96–112)
Creatinine, Ser: 0.78 mg/dL (ref 0.50–1.10)
GFR calc Af Amer: >90 mL/min (ref >90)
GFR calc non Af Amer: >90 mL/min (ref >90)
Glucose, Bld: 111 mg/dL — ABNORMAL HIGH (ref 70–99)
Potassium: 3.8 mmol/L (ref 3.5–5.1)
SODIUM: 138 mmol/L (ref 135–145)
Total Protein: 8 g/dL (ref 6.0–8.3)

## 2014-02-22 LAB — URINALYSIS, ROUTINE W REFLEX MICROSCOPIC
Bilirubin Urine: NEGATIVE
Glucose, UA: NEGATIVE mg/dL
Hgb urine dipstick: NEGATIVE
Ketones, ur: 15 mg/dL — AB
Nitrite: NEGATIVE
Protein, ur: NEGATIVE mg/dL
SPECIFIC GRAVITY, URINE: 1.024 (ref 1.005–1.030)
UROBILINOGEN UA: 0.2 mg/dL (ref 0.0–1.0)
pH: 6.5 (ref 5.0–8.0)

## 2014-02-22 LAB — CBC WITH DIFFERENTIAL/PLATELET
BASOS PCT: 1 % (ref 0–1)
Basophils Absolute: 0.1 10*3/uL (ref 0.0–0.1)
EOS ABS: 0.1 10*3/uL (ref 0.0–0.7)
EOS PCT: 0 % (ref 0–5)
HCT: 47.2 % — ABNORMAL HIGH (ref 36.0–46.0)
HEMOGLOBIN: 16.4 g/dL — AB (ref 12.0–15.0)
Lymphocytes Relative: 17 % (ref 12–46)
Lymphs Abs: 2.6 10*3/uL (ref 0.7–4.0)
MCH: 31.7 pg (ref 26.0–34.0)
MCHC: 34.7 g/dL (ref 30.0–36.0)
MCV: 91.1 fL (ref 78.0–100.0)
MONOS PCT: 6 % (ref 3–12)
Monocytes Absolute: 0.9 10*3/uL (ref 0.1–1.0)
NEUTROS PCT: 76 % (ref 43–77)
Neutro Abs: 11.2 10*3/uL — ABNORMAL HIGH (ref 1.7–7.7)
PLATELETS: 336 10*3/uL (ref 150–400)
RBC: 5.18 MIL/uL — ABNORMAL HIGH (ref 3.87–5.11)
RDW: 14 % (ref 11.5–15.5)
WBC: 14.8 10*3/uL — ABNORMAL HIGH (ref 4.0–10.5)

## 2014-02-22 LAB — LIPASE, BLOOD: Lipase: 29 U/L (ref 11–59)

## 2014-02-22 LAB — URINE MICROSCOPIC-ADD ON

## 2014-02-22 LAB — TROPONIN I

## 2014-02-22 MED ORDER — METRONIDAZOLE 500 MG PO TABS: 500.0000 mg | ORAL_TABLET | Freq: Two times a day (BID) | ORAL | Status: DC

## 2014-02-22 MED ORDER — ONDANSETRON HCL 4 MG/2ML IJ SOLN
4.0000 mg | Freq: Once | INTRAMUSCULAR | Status: AC
  Administered 2014-02-22: 4 mg via INTRAVENOUS
  Filled 2014-02-22: qty 2

## 2014-02-22 MED ORDER — SODIUM CHLORIDE 0.9 % IV BOLUS (SEPSIS)
1000.0000 mL | Freq: Once | INTRAVENOUS | Status: AC
  Administered 2014-02-22: 1000 mL via INTRAVENOUS

## 2014-02-22 MED ORDER — OXYCODONE-ACETAMINOPHEN 5-325 MG PO TABS: 1.0000 | ORAL_TABLET | Freq: Four times a day (QID) | ORAL | Status: DC | PRN

## 2014-02-22 MED ORDER — HYDROMORPHONE HCL 1 MG/ML IJ SOLN
1.0000 mg | Freq: Once | INTRAMUSCULAR | Status: AC
  Administered 2014-02-22: 1 mg via INTRAVENOUS
  Filled 2014-02-22: qty 1

## 2014-02-22 MED ORDER — ONDANSETRON HCL 4 MG PO TABS: 4.0000 mg | ORAL_TABLET | Freq: Three times a day (TID) | ORAL | Status: DC | PRN

## 2014-02-22 MED ORDER — SODIUM CHLORIDE 0.9 % IV SOLN
1000.0000 mL | Freq: Once | INTRAVENOUS | Status: AC
  Administered 2014-02-22: 1000 mL via INTRAVENOUS

## 2014-02-22 MED ORDER — PANTOPRAZOLE SODIUM 40 MG IV SOLR
40.0000 mg | Freq: Once | INTRAVENOUS | Status: AC
  Administered 2014-02-22: 40 mg via INTRAVENOUS
  Filled 2014-02-22: qty 40

## 2014-02-22 MED ORDER — CIPROFLOXACIN HCL 500 MG PO TABS: 500.0000 mg | ORAL_TABLET | Freq: Two times a day (BID) | ORAL | Status: DC

## 2014-02-22 NOTE — Discharge Instructions (Signed)
Diverticulitis °Diverticulitis is inflammation or infection of small pouches in your colon that form when you have a condition called diverticulosis. The pouches in your colon are called diverticula. Your colon, or large intestine, is where water is absorbed and stool is formed. °Complications of diverticulitis can include: °· Bleeding. °· Severe infection. °· Severe pain. °· Perforation of your colon. °· Obstruction of your colon. °CAUSES  °Diverticulitis is caused by bacteria. °Diverticulitis happens when stool becomes trapped in diverticula. This allows bacteria to grow in the diverticula, which can lead to inflammation and infection. °RISK FACTORS °People with diverticulosis are at risk for diverticulitis. Eating a diet that does not include enough fiber from fruits and vegetables may make diverticulitis more likely to develop. °SYMPTOMS  °Symptoms of diverticulitis may include: °· Abdominal pain and tenderness. The pain is normally located on the left side of the abdomen, but may occur in other areas. °· Fever and chills. °· Bloating. °· Cramping. °· Nausea. °· Vomiting. °· Constipation. °· Diarrhea. °· Blood in your stool. °DIAGNOSIS  °Your health care provider will ask you about your medical history and do a physical exam. You may need to have tests done because many medical conditions can cause the same symptoms as diverticulitis. Tests may include: °· Blood tests. °· Urine tests. °· Imaging tests of the abdomen, including X-rays and CT scans. °When your condition is under control, your health care provider may recommend that you have a colonoscopy. A colonoscopy can show how severe your diverticula are and whether something else is causing your symptoms. °TREATMENT  °Most cases of diverticulitis are mild and can be treated at home. Treatment may include: °· Taking over-the-counter pain medicines. °· Following a clear liquid diet. °· Taking antibiotic medicines by mouth for 7-10 days. °More severe cases may  be treated at a hospital. Treatment may include: °· Not eating or drinking. °· Taking prescription pain medicine. °· Receiving antibiotic medicines through an IV tube. °· Receiving fluids and nutrition through an IV tube. °· Surgery. °HOME CARE INSTRUCTIONS  °· Follow your health care provider's instructions carefully. °· Follow a full liquid diet or other diet as directed by your health care provider. After your symptoms improve, your health care provider may tell you to change your diet. He or she may recommend you eat a high-fiber diet. Fruits and vegetables are good sources of fiber. Fiber makes it easier to pass stool. °· Take fiber supplements or probiotics as directed by your health care provider. °· Only take medicines as directed by your health care provider. °· Keep all your follow-up appointments. °SEEK MEDICAL CARE IF:  °· Your pain does not improve. °· You have a hard time eating food. °· Your bowel movements do not return to normal. °SEEK IMMEDIATE MEDICAL CARE IF:  °· Your pain becomes worse. °· Your symptoms do not get better. °· Your symptoms suddenly get worse. °· You have a fever. °· You have repeated vomiting. °· You have bloody or black, tarry stools. °MAKE SURE YOU:  °· Understand these instructions. °· Will watch your condition. °· Will get help right away if you are not doing well or get worse. °Document Released: 10/03/2004 Document Revised: 12/29/2012 Document Reviewed: 11/18/2012 °ExitCare® Patient Information ©2015 [employer]. This information is not intended to replace advice given to you by your health care provider. Make sure you discuss any questions you have with your health care provider. ° ° ° °Emergency Department Resource Guide °1) Find a Doctor and Pay Out   of Pocket °Although you won't have to find out who is covered by your insurance plan, it is a good idea to ask around and get recommendations. You will then need to call the office and see if the doctor you have chosen will  accept you as a new patient and what types of options they offer for patients who are self-pay. Some doctors offer discounts or will set up payment plans for their patients who do not have insurance, but you will need to ask so you aren't surprised when you get to your appointment. ° °2) Contact Your Local Health Department °Not all health departments have doctors that can see patients for sick visits, but many do, so it is worth a call to see if yours does. If you don't know where your local health department is, you can check in your phone book. The CDC also has a tool to help you locate your state's health department, and many state websites also have listings of all of their local health departments. ° °3) Find a Walk-in Clinic °If your illness is not likely to be very severe or complicated, you may want to try a walk in clinic. These are popping up all over the country in pharmacies, drugstores, and shopping centers. They're usually staffed by nurse practitioners or physician assistants that have been trained to treat common illnesses and complaints. They're usually fairly quick and inexpensive. However, if you have serious medical issues or chronic medical problems, these are probably not your best option. ° °No Primary Care Doctor: °- Call [facility] at  832-8000 - they can help you locate a primary care doctor that  accepts your insurance, provides certain services, etc. °- Physician Referral Service- 1-800-533-3463 ° °Chronic Pain Problems: °Organization         Address  Phone   Notes  °[facility]  (336) 297-2271 Patients need to be referred by their primary care doctor.  ° °Medication Assistance: °Organization         Address  Phone   Notes  °[place] [facility] [address] °[place], [place] [address] (336) 641-8030 --Must be a resident of [place] °-- Must have NO insurance coverage whatsoever (no Medicaid/ Medicare, etc.) °-- The pt.  MUST have a primary care doctor that directs their care regularly and follows them in the community °  °[facility]  (866) 331-1348   °[employer]  (888) 892-1162   ° °Agencies that provide inexpensive medical care: °Organization         Address  Phone   Notes  °[facility]  (336) 832-8035   °[facility]    (336) 832-7272   °[facility] [address] °[place], [place] [address] (336) 832-4777   °[facility] [address], °[place] (336) 271-4999   °[facility]    (336) 373-0678   °[facility]    (336) 272-1050   °[facility] ° [address], [place] Phone:  (336) 832-4444, Fax:  (336) 832-4440 Hours of Operation:  9 am - 6 pm, M-F.  Also accepts Medicaid/Medicare and self-pay.  °[facility] ° [address], [place] Phone: (336) [phone], Fax: (336) [phone]. Hours of Operation:  8:30 am - 5:30 pm, M-F.  Also accepts Medicaid and self-pay.  °[facility] [place] [address], [place] Phone: (336) 878-6027   °[facility] [address], [place], [place] (336)723-1848, Ext. 123 Mondays & Thursdays: 7-9 AM.  First 15 patients are seen on a first come, first serve basis. °  ° °Medicaid-accepting [place] Providers: ° °Organization         Address  Phone   Notes  °[facility] [address], [place] (336) 641-2100 Also accepts self-pay patients.  °[facility] [address], [place] ° (336) 856-9996   °[facility] [address], [place] (336) 288-8857   °[facility] [address] [place] [address], [place] (336) 299-7000   °Veita Bland [address], [place]  ° (336) 373-1557 Only accepts [place] Access Medicaid patients after they have their name applied to their card.  ° °Self-Pay (no insurance) in  [place]: ° °Organization         Address  Phone   Notes  °Sickle Cell Patients, [facility] [address], [place] (336) 832-1970   °[facility] [address], [place] (336) 832-4400   °[facility] ° [address], [place] (336) 992-4800   °[facility]/Dr. Osei-Bonsu ° [address] [place] [address], [place] or [address], [place] (336) 841-8500 Phone number for both [place] and [place] locations is the same.  °[facility] [address], [place] (336) 299-0000   °[facility] [address] [place] [address], [place] or [address] (336) 852-7530 °(336) 878-2260   °[facility] [address], [place] (336) 350-1642, phone; (336) 294-5005, fax Sees patients 1st and 3rd Saturday of every month.  Must not qualify for public or private insurance (i.e. Medicaid, Medicare, [place] Health Choice, Veterans' Benefits) • Household income should be no more than 200% of the poverty level •The clinic cannot treat you if you are pregnant or think you are pregnant • Sexually transmitted diseases are not treated at the clinic.  ° ° °Dental Care: °Organization         Address  Phone  Notes  °[facility] [address], [place] (336) 641-6152 Accepts children up to age 21 who are enrolled in Medicaid or [place] Health Choice; pregnant women with a Medicaid card; and children who have applied for Medicaid or [place] Health Choice, but were declined, whose parents can pay a reduced fee at time of service.  °[facility] [place]  [address], [place] (336) 641-7733 Accepts children up to age 21 who are enrolled in Medicaid or  Health Choice; pregnant women with a Medicaid card; and children who have applied for Medicaid or  Health Choice, but were declined, whose  parents can pay a reduced fee at time of service.  °[facility] ° [address],  (336) 641-4533 Patients are seen by appointment only. Walk-ins are not accepted. [facility] will see patients 18 years of age and older. °Monday - Tuesday (8am-5pm) °Most Wednesdays (8:30-5pm) °$30 per visit, cash only  °[facility] ° [address], [place] (336) 641-4533 Patients are seen by appointment only. Walk-ins are not accepted. [facility] will see patients 18 years of age and older. °One Wednesday Evening (Monthly: Volunteer Based).  $30 per visit, cash   only  °[facility]  (919) 537-3737 for adults; Children under age 4, call Graduate Pediatric Dentistry at ([phone]) [phone]. Children aged 4-14, please call ([phone]) [phone] to request a pediatric application. ° Dental services are provided in all areas of dental care including fillings, crowns and bridges, complete and partial dentures, implants, gum treatment, root canals, and extractions. Preventive care is also provided. Treatment is provided to both adults and children. °Patients are selected via a lottery and there is often a waiting list. °  °[facility] [address], °[place] ° (336) 763-8833 [email or web address] °  °[facility] [address], [place], [place] (336)723-1848, Ext. 123 Second and Fourth Thursday of each month, opens at 6:30 AM; Clinic ends at 9 AM.  Patients are seen on a first-come first-served basis, and a limited number are seen during each clinic.  ° °[facility] ° [address], [place], [place] (336) 723-7904   Eligibility Requirements °You must have lived in [place], [place], or [place] counties for at least the last three months. °  You cannot be eligible for state or federal sponsored healthcare insurance, including Veterans Administration, Medicaid, or Medicare. °  You generally cannot be eligible for  healthcare insurance through your employer.  °  How to apply: °Eligibility screenings are held every Tuesday and Wednesday afternoon from 1:00 pm until 4:00 pm. You do not need an appointment for the interview!  °[facility] [address], [place], [place] 336-631-2330   °[facility]  336-342-8273   °[facility]  336-703-3100   °[facility]  336-570-6415   ° °Behavioral Health Resources in the Community: °Intensive Outpatient Programs °Organization         Address  Phone  Notes  °[facility] [address], [place], [place] 336-878-6098   °[facility] [address], [place], [place] 336-832-9800   °[facility] [address], [place], [place] ° 336-882-2125   °[facility] [address],  °[place], [place] 1-800-853-5163 or 336-641-4981   °Substance Abuse Resources °Organization         Address  Phone  Notes  °[facility]  336-882-2125   °[facility]  336-784-9470   °[facility]  336-285-9073   °[facility]  336-845-3988   °Residential & Outpatient Substance Abuse Program  1-800-659-3381   °Psychological Services °Organization         Address  Phone  Notes  °[facility]  336- 832-9600   °[facility]  336- 378-7881   °[facility] [address], [place] 1-800-853-5163 or 336-641-4981   ° °Mobile Crisis Teams °Organization         Address  Phone  Notes  °[facility], Mobile Crisis Care Unit  1-877-626-1772   °Assertive °[facility] ° [address] [place], [place] 336-834-9664   °Sharon DeEsch [address] °[place] 336-554-5454   ° °Self-Help/Support Groups °Organization         Address  Phone             Notes  °[facility] [place] - variety of support groups  336- 373-1402 Call for more information  °Narcotics  Anonymous (NA), [facility] [address], °[place]  2 meetings at this location  ° °Residential Treatment Programs °Organization           Address  Phone  Notes  °[facility] [address],    °[place]  1-866-801-8205   °[facility] ° [address], [place], [place] 704-293-8524   °[facility] [address], [place] 336-845-3988 Admissions: 8am-3pm M-F  °[facility] [address]    °[place], [place] 336-841-1104   °[facility] [address], [place], [place] 336-379-7146   °[facility] [address]  °[place], [place] 336-285-9073   °[facility] Intensive Outpatient [address], [place], [place] 336-852-3033   °[facility] [address]  °[place], [place] 1-877-615-2722 or 336-784-9470   °[facility] [address] [place], [place] 336-227-7417 Accepts Medicaid  °[facility] [address]  °[place] 1-800-659-3381 Substance Abuse/Addiction Treatment  ° °[place] Behavioral Health Resources °Organization         Address  Phone  Notes  °[facility]  (888) 581-9988   °Julie Brannon, PhD [address] [place], [place]   (336) 349-5553 or (336) 951-0000   °[facility]   [address] °[place], [place] (336) 349-4454   °[facility] [address], [place], [place] (336) 342-8316 Insurance/Medicaid/sponsorship through [facility]  °[facility] [address]                                    [place], [place] (336) 342-8316 Therapy/tele-psych/case  °[facility] [address]  ° [place], [place] (336) 349-2233    °Dr. Arfeen  (336) 349-4544   °[facility] [place]  [employer] [place] [facility] 1) [address], [place] °2) [address], [place] °3)  [address], [place] (336) 349-3220 °(336)  342-7768 ° °(336) 342-8140   °[place] [facility] (336) 342-1394 or (336) 342-3537 (After Hours)    ° ° ° °

## 2014-02-22 NOTE — ED Provider Notes (Signed)
CSN: [id number]     Arrival date & time 02/22/14  1011 History   First MD Initiated Contact with Patient 02/22/14 1202     Chief Complaint  Patient presents with  . Diarrhea     (Consider location/radiation/quality/duration/timing/severity/associated sxs/prior Treatment) HPI Dana Morrow is a 49 year old female past medical history of chronic left lower quadrant abdominal pain, GERD, diverticulitis who presents the ER complaining of nausea, vomiting, diarrhea, chest pain. Patient states her nausea, vomiting and diarrhea began acutely around noon yesterday. Patient reports having up to 20 episodes of nonbilious, nonbloody vomiting, with multiple episodes of loose stools. Patient states her symptoms have persisted to the point she has been unable to keep food down. Patient's also describing a burning sensation in her epigastrium which she is describing as chest discomfort. Patient also complains of associated left lower quadrant abdominal pain, which she states is made worse with vomiting. She states this discomfort began this morning around 7 AM, and has been persistent. She states the pain as a constant, pressure. She denies associated shortness of breath, palpitations, dizziness, weakness, headache, blurred vision.  Past Medical History  Diagnosis Date  . RSD (reflex sympathetic dystrophy)     a. L shoulder  . Migraine   . Colon polyps     a. s/p colonoscopy and polypectomy in 2003  . Abdominal pain, chronic, left lower quadrant     a. s/p Laproscopy in 2004 w/o finding of source of discomfort.  [name] GERD (gastroesophageal reflux disease)   . Hiatal hernia     a. small by EGD 2006.  [name] Visual disturbance     a. 12/2011 Left eye visual changes->w/u for TIA->Carotid U/S:  No ICA stenosis; Head CT: No acute abnl; MRI/MRA: mild RPCA stenosis; Echo: EF 55-60%, Triv AI, No PFO.  . Tobacco abuse     a. 50 pack year hx.  . Cholelithiasis     a. s/p cholecystectomy 10/2011  . Chest pain     a.  reportedly nl MV and echo 10/2011 - [place]  . Diverticulitis    Past Surgical History  Procedure Laterality Date  . Cesarean section    . Shoulder surgery    . Cholecystectomy      a. 10/2011  . Hernia repair     Family History  Problem Relation Age of Onset  . Dementia Father   . Parkinson's disease Father     alive @ 53  . Heart attack Mother     died @ 53   History  Substance Use Topics  . Smoking status: Current Every Day Smoker -- 0.50 packs/day for 25 years    Types: Cigarettes  . Smokeless tobacco: Not on file     Comment: Smoked 2ppd for roughly 25 yrs but cut back to 1/2 ppd about 6 mos ago.  . Alcohol Use: No   OB History    No data available     Review of Systems  Constitutional: Negative for fever.  HENT: Negative for trouble swallowing.   Eyes: Negative for visual disturbance.  Respiratory: Negative for shortness of breath.   Cardiovascular: Negative for chest pain.  Gastrointestinal: Positive for nausea, vomiting, abdominal pain and diarrhea.  Genitourinary: Negative for dysuria.  Musculoskeletal: Negative for neck pain.  Skin: Negative for rash.  Neurological: Negative for dizziness, weakness and numbness.  Psychiatric/Behavioral: Negative.       Allergies  Levaquin  Home Medications   Prior to Admission medications   Medication Sig Start Date End  Date Taking? Authorizing Provider  metoprolol succinate (TOPROL-XL) 100 MG 24 hr tablet Take 100 mg by mouth daily. Take with or immediately following a meal.   Yes Historical Provider, MD  morphine (KADIAN) 20 MG 24 hr capsule Take 20 mg by mouth daily.   Yes Historical Provider, MD  aspirin 81 MG chewable tablet Chew 324 mg by mouth once.    Historical Provider, MD  cephALEXin (KEFLEX) 500 MG capsule Take 1 capsule (500 mg total) by mouth 4 (four) times daily. 09/21/13   Benny Lennert, MD  ciprofloxacin (CIPRO) 500 MG tablet Take 1 tablet (500 mg total) by mouth 2 (two) times daily. One po bid  x 7 days 02/22/14   Monte Fantasia, PA-C  HYDROcodone-acetaminophen (NORCO/VICODIN) 5-325 MG per tablet Take 1 tablet by mouth once as needed for moderate pain.    Historical Provider, MD  lisinopril (PRINIVIL,ZESTRIL) 20 MG tablet Take 1 tablet (20 mg total) by mouth daily. 09/21/13   Benny Lennert, MD  metroNIDAZOLE (FLAGYL) 500 MG tablet Take 1 tablet (500 mg total) by mouth 2 (two) times daily. One po bid x 7 days 02/22/14   Monte Fantasia, PA-C  ondansetron [facility] ODT) 4 MG disintegrating tablet 4mg  ODT q4 hours prn nausea/vomit 09/21/13   Benny Lennert, MD  ondansetron (ZOFRAN) 4 MG tablet Take 1 tablet (4 mg total) by mouth every 6 (six) hours. 11/10/13   Glynn Octave, MD  ondansetron (ZOFRAN) 4 MG tablet Take 1 tablet (4 mg total) by mouth every 8 (eight) hours as needed for nausea or vomiting. 02/22/14   Monte Fantasia, PA-C  oxyCODONE-acetaminophen (PERCOCET) 5-325 MG per tablet Take 1-2 tablets by mouth every 6 (six) hours as needed. 02/22/14   Monte Fantasia, PA-C  oxyCODONE-acetaminophen (PERCOCET/ROXICET) 5-325 MG per tablet Take 1 tablet by mouth every 6 (six) hours as needed. 09/21/13   Benny Lennert, MD   BP 125/81 mmHg  Pulse 92  Temp(Src) 98.4 F (36.9 C) (Oral)  Resp 18  Ht 5\' 3"  (1.6 m)  Wt 156 lb (70.761 kg)  BMI 27.64 kg/m2  SpO2 98% Physical Exam  Constitutional: She is oriented to person, place, and time. She appears well-developed and well-nourished. No distress.  HENT:  Head: Normocephalic and atraumatic.  Mouth/Throat: Oropharynx is clear and moist. No oropharyngeal exudate.  Eyes: Right eye exhibits no discharge. Left eye exhibits no discharge. No scleral icterus.  Neck: Normal range of motion.  Cardiovascular: Normal rate, regular rhythm and normal heart sounds.   No murmur heard. Pulmonary/Chest: Effort normal and breath sounds normal. No respiratory distress.  Abdominal: Soft. There is tenderness in the epigastric area and left lower quadrant. There is  no rigidity, no guarding, no tenderness at McBurney's point and negative Murphy's sign.  Musculoskeletal: Normal range of motion. She exhibits no edema or tenderness.  Neurological: She is alert and oriented to person, place, and time. No cranial nerve deficit. Coordination normal.  Skin: Skin is warm and dry. No rash noted. She is not diaphoretic.  Psychiatric: She has a normal mood and affect.  Nursing note and vitals reviewed.   ED Course  Procedures (including critical care time) Labs Review Labs Reviewed  CBC WITH DIFFERENTIAL/PLATELET - Abnormal; Notable for the following:    WBC 14.8 (*)    RBC 5.18 (*)    Hemoglobin 16.4 (*)    HCT 47.2 (*)    Neutro Abs 11.2 (*)    All other components within  normal limits  COMPREHENSIVE METABOLIC PANEL - Abnormal; Notable for the following:    Glucose, Bld 111 (*)    All other components within normal limits  URINALYSIS, ROUTINE W REFLEX MICROSCOPIC - Abnormal; Notable for the following:    Ketones, ur 15 (*)    Leukocytes, UA SMALL (*)    All other components within normal limits  URINE MICROSCOPIC-ADD ON - Abnormal; Notable for the following:    Squamous Epithelial / LPF FEW (*)    Bacteria, UA FEW (*)    Casts HYALINE CASTS (*)    All other components within normal limits  LIPASE, BLOOD  TROPONIN I    Imaging Review Dg Chest 2 View  02/22/2014   CLINICAL DATA:  49 year old female with vomiting diarrhea anterior chest pain. Initial encounter.  EXAM: CHEST  2 VIEW  COMPARISON:  01/11/2014 and earlier.  FINDINGS: Stable mild elevation of the right hemidiaphragm. Lung volumes are within normal limits. Normal cardiac size and mediastinal contours. Visualized tracheal air column is within normal limits. The lungs remain clear. No pneumothorax or pleural effusion. No pneumoperitoneum. Stable cholecystectomy clips. No acute osseous abnormality identified.  IMPRESSION: Negative, no acute cardiopulmonary abnormality.   Electronically Signed    By: Odessa FlemingH  Hall M.D.   On: 02/22/2014 13:26     EKG Interpretation   Date/Time:  Tuesday February 22 2014 10:19:34 EST Ventricular Rate:  126 PR Interval:  128 QRS Duration: 66 QT Interval:  306 QTC Calculation: 443 R Axis:   58 Text Interpretation:  Sinus tachycardia Right atrial enlargement  Borderline ECG No significant change since No significant change since  last tracing Confirmed by NANAVATI, MD, Janey GentaANKIT [phone]([id number]) on 02/22/2014 1:00:02  PM      MDM   Final diagnoses:  Chest discomfort  Diverticulitis of colon  Esophagitis    Patient with abdominal pain, chest pain, nausea, vomiting, diarrhea. Patient's chest pain is reproducible in her epigastrium, she is stating this pain is the same pain she is describing as her chest discomfort. Based on patient's age and history, we will perform ACS rule out, and symptomatic therapy. Patient reporting her abdominal symptoms are consistent with previous flareups of diverticulitis.  With atypical chest pain symptoms lasting greater than 6 hours and negative troponin along with unremarkable chest x-ray and EKG without evidence of injury or ectopy, it is very unlikely the patient is experiencing chest discomfort from ACS. Patient's chest discomfort is reproducible in her epigastrium, and symptoms subside with her nausea and pain control the ER completely. Patient stating her pain is consistent with pain she has had in the past with diverticulitis. With mild tachycardia here, leukocytosis of 14, and some left lower quadrant tenderness is likely clinically patient is expressing some diverticulitis. We'll place patient on Cipro and Flagyl for same.   Patient is nontoxic, nonseptic appearing, in no apparent distress.  Patient's pain and other symptoms adequately managed in emergency department.  Fluid bolus given.  Labs, imaging and vitals reviewed.  Patient does not meet the SIRS or Sepsis criteria.  On repeat exam patient does not have a surgical abdomin  and there are no peritoneal signs.  No indication of appendicitis, bowel obstruction, bowel perforation, cholecystitis, diverticulitis, PID or ectopic pregnancy.  Patient discharged home with symptomatic treatment and given strict instructions for follow-up with their primary care physician.  I have also discussed reasons to return immediately to the ER.  Patient expresses understanding and agrees with plan. I encouraged patient to call or return  to ER with any worsening symptoms or should she have any questions or concerns.  BP 125/81 mmHg  Pulse 92  Temp(Src) 98.4 F (36.9 C) (Oral)  Resp 18  Ht  (1.6 m)  Wt 156 lb (70.761 kg)  BMI 27.64 kg/m2  SpO2 98%  Signed,  Ladona Mow, PA-C 11:51 PM  Patient discussed with Derwood Kaplan, MD     Monte Fantasia, PA-C 02/22/14 2351  Derwood Kaplan, MD 02/24/14 [phone]

## 2014-02-22 NOTE — ED Notes (Signed)
Vomiting and diarrhea since yesterday at noon. Pt states heart beating so fast now that it hurts. Denies fever.

## 2014-02-22 NOTE — ED Notes (Signed)
States is eady to go .  Feels much better

## 2014-02-22 NOTE — ED Notes (Signed)
Pt requesting more antiemetic for nausea

## 2014-02-23 ENCOUNTER — Emergency Department (HOSPITAL_BASED_OUTPATIENT_CLINIC_OR_DEPARTMENT_OTHER)
Admission: EM | Admit: 2014-02-23 | Discharge: 2014-02-24 | Disposition: A | Discharge status: Home / Self Care | Payer: Medicare Other | Attending: Emergency Medicine | Admitting: Emergency Medicine

## 2014-02-23 ENCOUNTER — Encounter (HOSPITAL_BASED_OUTPATIENT_CLINIC_OR_DEPARTMENT_OTHER): Payer: Self-pay | Admitting: *Deleted

## 2014-02-23 DIAGNOSIS — Z8719 Personal history of other diseases of the digestive system: Secondary | ICD-10-CM | POA: Insufficient documentation

## 2014-02-23 DIAGNOSIS — G8929 Other chronic pain: Secondary | ICD-10-CM | POA: Insufficient documentation

## 2014-02-23 DIAGNOSIS — Z9889 Other specified postprocedural states: Secondary | ICD-10-CM | POA: Diagnosis not present

## 2014-02-23 DIAGNOSIS — Z9089 Acquired absence of other organs: Secondary | ICD-10-CM | POA: Insufficient documentation

## 2014-02-23 DIAGNOSIS — Z7982 Long term (current) use of aspirin: Secondary | ICD-10-CM | POA: Diagnosis not present

## 2014-02-23 DIAGNOSIS — Z8669 Personal history of other diseases of the nervous system and sense organs: Secondary | ICD-10-CM | POA: Diagnosis not present

## 2014-02-23 DIAGNOSIS — Z720 Tobacco use: Secondary | ICD-10-CM | POA: Diagnosis not present

## 2014-02-23 DIAGNOSIS — G43909 Migraine, unspecified, not intractable, without status migrainosus: Secondary | ICD-10-CM | POA: Insufficient documentation

## 2014-02-23 DIAGNOSIS — Z79899 Other long term (current) drug therapy: Secondary | ICD-10-CM | POA: Insufficient documentation

## 2014-02-23 DIAGNOSIS — R1032 Left lower quadrant pain: Secondary | ICD-10-CM | POA: Diagnosis not present

## 2014-02-23 DIAGNOSIS — Z86010 Personal history of colonic polyps: Secondary | ICD-10-CM | POA: Diagnosis not present

## 2014-02-23 DIAGNOSIS — R109 Unspecified abdominal pain: Secondary | ICD-10-CM | POA: Diagnosis present

## 2014-02-23 DIAGNOSIS — Z792 Long term (current) use of antibiotics: Secondary | ICD-10-CM | POA: Insufficient documentation

## 2014-02-23 LAB — CBC WITH DIFFERENTIAL/PLATELET
Basophils Absolute: 0.1 10*3/uL (ref 0.0–0.1)
Basophils Relative: 1 % (ref 0–1)
Eosinophils Absolute: 0.3 10*3/uL (ref 0.0–0.7)
Eosinophils Relative: 2 % (ref 0–5)
HCT: 40.2 % (ref 36.0–46.0)
HEMOGLOBIN: 13.5 g/dL (ref 12.0–15.0)
Lymphocytes Relative: 41 % (ref 12–46)
Lymphs Abs: 5.4 10*3/uL — ABNORMAL HIGH (ref 0.7–4.0)
MCH: 31.4 pg (ref 26.0–34.0)
MCHC: 33.6 g/dL (ref 30.0–36.0)
MCV: 93.5 fL (ref 78.0–100.0)
MONOS PCT: 7 % (ref 3–12)
Monocytes Absolute: 0.9 10*3/uL (ref 0.1–1.0)
NEUTROS ABS: 6.5 10*3/uL (ref 1.7–7.7)
NEUTROS PCT: 49 % (ref 43–77)
PLATELETS: 278 10*3/uL (ref 150–400)
RBC: 4.3 MIL/uL (ref 3.87–5.11)
RDW: 13.5 % (ref 11.5–15.5)
WBC: 13.2 10*3/uL — ABNORMAL HIGH (ref 4.0–10.5)

## 2014-02-23 LAB — COMPREHENSIVE METABOLIC PANEL
ALT: 11 U/L (ref 0–35)
AST: 12 U/L (ref 0–37)
Albumin: 3.6 g/dL (ref 3.5–5.2)
Alkaline Phosphatase: 57 U/L (ref 39–117)
Anion gap: 5 (ref 5–15)
BUN: 15 mg/dL (ref 6–23)
CALCIUM: 8.6 mg/dL (ref 8.4–10.5)
CO2: 24 mmol/L (ref 19–32)
Chloride: 108 mmol/L (ref 96–112)
Creatinine, Ser: 0.68 mg/dL (ref 0.50–1.10)
GFR calc Af Amer: >90 mL/min (ref >90)
GLUCOSE: 93 mg/dL (ref 70–99)
Potassium: 3.4 mmol/L — ABNORMAL LOW (ref 3.5–5.1)
SODIUM: 137 mmol/L (ref 135–145)
Total Bilirubin: 0.5 mg/dL (ref 0.3–1.2)
Total Protein: 6.5 g/dL (ref 6.0–8.3)

## 2014-02-23 MED ORDER — ONDANSETRON 4 MG PO TBDP
4.0000 mg | ORAL_TABLET | Freq: Once | ORAL | Status: AC
  Administered 2014-02-23: 4 mg via ORAL
  Filled 2014-02-23: qty 1

## 2014-02-23 MED ORDER — OXYCODONE-ACETAMINOPHEN 5-325 MG PO TABS
2.0000 | ORAL_TABLET | Freq: Once | ORAL | Status: AC
  Administered 2014-02-23: 2 via ORAL
  Filled 2014-02-23: qty 2

## 2014-02-23 NOTE — ED Notes (Signed)
Pt was seen here yesterday for abdominal pain and N/V/D. She sts the pain is getting worse and she is still having nausea and dry heaves.

## 2014-02-23 NOTE — ED Provider Notes (Signed)
CSN: [id number]     Arrival date & time 02/23/14  1903 History   First MD Initiated Contact with Patient 02/23/14 2012     This chart was scribed for Arby BarretteMarcy Hamilton Marinello, MD by Arlan OrganAshley Leger, ED Scribe. This patient was seen in room MH06/MH06 and the patient's care was started 9:19 PM.   Chief Complaint  Patient presents with  . Abdominal Pain    HPI  HPI Comments: Dana Morrow is a [age] y.o. female with a PMHx of diverticulitis who presents to the Emergency Department complaining of constant, moderate, ongoing abdominal pain this evening. Dana Morrow also reports ongoing, constant vomiting and diarrhea. The patient reports that Dana Morrow came to the emergency department because Dana Morrow had gotten a nurse call and Dana Morrow reported that her pain was worse or no better and they recommended Dana Morrow come to the emergency department for assessment. Pt states Dana Morrow feels currently symptoms are consistent with diverticulitis flare ups. Initial flare up at the age of 49. Pt was seen yesterday 2/16 for LLQ abdominal pain, CP, nausea, vomiting, and diarrhea. At time of visit, Troponin, CXR, and EKG performed all unremarkable .Dana Morrow was discharged home with prescriptions for Zofran, Flagyl, and Cipro. Dana Morrow states symptoms have worsened since visit yesterday and Dana Morrow is still unable to keep any solid foods or liquids down. Last full meal 2 days ago. PSHx includes cholecystectomy October 2013 with mesh placement, shoulder surgery, and hernia repair. Pt with known allergy to Levaquin.  Past Medical History  Diagnosis Date  . RSD (reflex sympathetic dystrophy)     a. L shoulder  . Migraine   . Colon polyps     a. s/p colonoscopy and polypectomy in 2003  . Abdominal pain, chronic, left lower quadrant     a. s/p Laproscopy in 2004 w/o finding of source of discomfort.  [name]. GERD (gastroesophageal reflux disease)   . Hiatal hernia     a. small by EGD 2006.  [name]. Visual disturbance     a. 12/2011 Left eye visual changes->w/u for TIA->Carotid U/S:   No ICA stenosis; Head CT: No acute abnl; MRI/MRA: mild RPCA stenosis; Echo: EF 55-60%, Triv AI, No PFO.  . Tobacco abuse     a. 50 pack year hx.  . Cholelithiasis     a. s/p cholecystectomy 10/2011  . Chest pain     a. reportedly nl MV and echo 10/2011 - [place]  . Diverticulitis    Past Surgical History  Procedure Laterality Date  . Cesarean section    . Shoulder surgery    . Cholecystectomy      a. 10/2011  . Hernia repair     Family History  Problem Relation Age of Onset  . Dementia Father   . Parkinson's disease Father     alive @ 2575  . Heart attack Mother     died @ 7067   History  Substance Use Topics  . Smoking status: Current Every Day Smoker -- 0.50 packs/day for 25 years    Types: Cigarettes  . Smokeless tobacco: Not on file     Comment: Smoked 2ppd for roughly 25 yrs but cut back to 1/2 ppd about 6 mos ago.  . Alcohol Use: No   OB History    No data available     Review of Systems  A complete 10 system review of systems was obtained and all systems are negative except as noted in the HPI and PMH.    Allergies  Levaquin  Home Medications   Prior to Admission medications   Medication Sig Start Date End Date Taking? Authorizing Provider  aspirin 81 MG chewable tablet Chew 324 mg by mouth once.    Historical Provider, MD  cephALEXin (KEFLEX) 500 MG capsule Take 1 capsule (500 mg total) by mouth 4 (four) times daily. 09/21/13   Benny Lennert, MD  ciprofloxacin (CIPRO) 500 MG tablet Take 1 tablet (500 mg total) by mouth 2 (two) times daily. One po bid x 7 days 02/22/14   Monte Fantasia, PA-C  HYDROcodone-acetaminophen (NORCO/VICODIN) 5-325 MG per tablet Take 1 tablet by mouth once as needed for moderate pain.    Historical Provider, MD  lisinopril (PRINIVIL,ZESTRIL) 20 MG tablet Take 1 tablet (20 mg total) by mouth daily. 09/21/13   Benny Lennert, MD  metoprolol succinate (TOPROL-XL) 100 MG 24 hr tablet Take 100 mg by mouth daily. Take with or immediately  following a meal.    Historical Provider, MD  metroNIDAZOLE (FLAGYL) 500 MG tablet Take 1 tablet (500 mg total) by mouth 2 (two) times daily. One po bid x 7 days 02/22/14   Monte Fantasia, PA-C  morphine (KADIAN) 20 MG 24 hr capsule Take 20 mg by mouth daily.    Historical Provider, MD  ondansetron (ZOFRAN ODT) 4 MG disintegrating tablet  ODT q4 hours prn nausea/vomit 09/21/13   Benny Lennert, MD  ondansetron (ZOFRAN) 4 MG tablet Take 1 tablet (4 mg total) by mouth every 6 (six) hours. 11/10/13   Glynn Octave, MD  ondansetron (ZOFRAN) 4 MG tablet Take 1 tablet (4 mg total) by mouth every 8 (eight) hours as needed for nausea or vomiting. 02/22/14   Monte Fantasia, PA-C  oxyCODONE-acetaminophen (PERCOCET) 5-325 MG per tablet Take 1-2 tablets by mouth every 6 (six) hours as needed. 02/22/14   Monte Fantasia, PA-C  oxyCODONE-acetaminophen (PERCOCET/ROXICET) 5-325 MG per tablet Take 1 tablet by mouth every 6 (six) hours as needed. 09/21/13   Benny Lennert, MD   Triage Vitals: BP 131/85 mmHg  Pulse 84  Temp(Src) 98.1 F (36.7 C) (Oral)  Resp 18  Ht  (1.6 m)  Wt 156 lb (70.761 kg)  BMI 27.64 kg/m2  SpO2 98%   Physical Exam  Constitutional: Dana Morrow is oriented to person, place, and time. Dana Morrow appears well-developed and well-nourished.  HENT:  Head: Normocephalic and atraumatic.  Eyes: EOM are normal. Pupils are equal, round, and reactive to light.  Neck: Neck supple.  Cardiovascular: Normal rate, regular rhythm, normal heart sounds and intact distal pulses.   Pulmonary/Chest: Effort normal and breath sounds normal.  Abdominal: Soft. Bowel sounds are normal. Dana Morrow exhibits no distension. There is tenderness.  The patient endorses tenderness in the left lower quadrant, pain concentrates to the very low left quadrant and is tender to palpation within the inguinal region. There is no mass within the inguinal region, no lymphadenopathy, no abnormal pulsations. There is no redness or soft tissue  swelling of the upper leg.  Musculoskeletal: Normal range of motion. Dana Morrow exhibits no edema.  Neurological: Dana Morrow is alert and oriented to person, place, and time. Dana Morrow has normal strength. Coordination normal. GCS eye subscore is 4. GCS verbal subscore is 5. GCS motor subscore is 6.  Skin: Skin is warm, dry and intact.  Psychiatric: Dana Morrow has a normal mood and affect.    ED Course  Procedures (including critical care time)  DIAGNOSTIC STUDIES: Oxygen Saturation is 99% on RA, Normal by my interpretation.  COORDINATION OF CARE: 12:20 AM-Discussed treatment plan with pt at bedside and pt agreed to plan.     Labs Review Labs Reviewed  COMPREHENSIVE METABOLIC PANEL - Abnormal; Notable for the following:    Potassium 3.4 (*)    All other components within normal limits  CBC WITH DIFFERENTIAL/PLATELET - Abnormal; Notable for the following:    WBC 13.2 (*)    Lymphs Abs 5.4 (*)    All other components within normal limits    Imaging Review Dg Chest 2 View  02/22/2014   CLINICAL DATA:  49 year old female with vomiting diarrhea anterior chest pain. Initial encounter.  EXAM: CHEST  2 VIEW  COMPARISON:  01/11/2014 and earlier.  FINDINGS: Stable mild elevation of the right hemidiaphragm. Lung volumes are within normal limits. Normal cardiac size and mediastinal contours. Visualized tracheal air column is within normal limits. The lungs remain clear. No pneumothorax or pleural effusion. No pneumoperitoneum. Stable cholecystectomy clips. No acute osseous abnormality identified.  IMPRESSION: Negative, no acute cardiopulmonary abnormality.   Electronically Signed   By: Odessa Fleming M.D.   On: 02/22/2014 13:26     EKG Interpretation None      MDM   Final diagnoses:  Left lower quadrant pain   At this time the patient's general condition appears to be good. There is no clinical sign of dehydration. Her labs are improved as compared to yesterday. Her physical examination is for a nonsurgical abdominal  examination. The pain tends to be closer to the groin really than the abdomen. There is no palpable mass within the groin and Dana Morrow exhibits no obstructive type symptoms. The patient is follow-up with her family physician tomorrow and I feel Dana Morrow is safe for discharge at this point time Dana Morrow has pain and nausea medicines at home to take for her symptoms.    Arby Barrette, MD 02/24/14 [phone]

## 2014-02-23 NOTE — ED Notes (Signed)
C/o vomiting, diarrhea and pain and and distention onset Monday  Was seen here yesterday for same

## 2014-02-24 DIAGNOSIS — M5417 Radiculopathy, lumbosacral region: Secondary | ICD-10-CM | POA: Diagnosis not present

## 2014-02-24 DIAGNOSIS — G90512 Complex regional pain syndrome I of left upper limb: Secondary | ICD-10-CM | POA: Diagnosis not present

## 2014-02-24 DIAGNOSIS — G894 Chronic pain syndrome: Secondary | ICD-10-CM | POA: Diagnosis not present

## 2014-02-24 NOTE — Discharge Instructions (Signed)
Abdominal Pain, Women °Abdominal (stomach, pelvic, or belly) pain can be caused by many things. It is important to tell your doctor: °· The location of the pain. °· Does it come and go or is it present all the time? °· Are there things that start the pain (eating certain foods, exercise)? °· Are there other symptoms associated with the pain (fever, nausea, vomiting, diarrhea)? °All of this is helpful to know when trying to find the cause of the pain. °CAUSES  °· Stomach: virus or bacteria infection, or ulcer. °· Intestine: appendicitis (inflamed appendix), regional ileitis (Crohn's disease), ulcerative colitis (inflamed colon), irritable bowel syndrome, diverticulitis (inflamed diverticulum of the colon), or cancer of the stomach or intestine. °· Gallbladder disease or stones in the gallbladder. °· Kidney disease, kidney stones, or infection. °· Pancreas infection or cancer. °· Fibromyalgia (pain disorder). °· Diseases of the female organs: °¨ Uterus: fibroid (non-cancerous) tumors or infection. °¨ Fallopian tubes: infection or tubal pregnancy. °¨ Ovary: cysts or tumors. °¨ Pelvic adhesions (scar tissue). °¨ Endometriosis (uterus lining tissue growing in the pelvis and on the pelvic organs). °¨ Pelvic congestion syndrome (female organs filling up with blood just before the menstrual period). °¨ Pain with the menstrual period. °¨ Pain with ovulation (producing an egg). °¨ Pain with an IUD (intrauterine device, birth control) in the uterus. °¨ Cancer of the female organs. °· Functional pain (pain not caused by a disease, may improve without treatment). °· Psychological pain. °· Depression. °DIAGNOSIS  °Your doctor will decide the seriousness of your pain by doing an examination. °· Blood tests. °· X-rays. °· Ultrasound. °· CT scan (computed tomography, special type of X-ray). °· MRI (magnetic resonance imaging). °· Cultures, for infection. °· Barium enema (dye inserted in the large intestine, to better view it with  X-rays). °· Colonoscopy (looking in intestine with a lighted tube). °· Laparoscopy (minor surgery, looking in abdomen with a lighted tube). °· Major abdominal exploratory surgery (looking in abdomen with a large incision). °TREATMENT  °The treatment will depend on the cause of the pain.  °· Many cases can be observed and treated at home. °· Over-the-counter medicines recommended by your caregiver. °· Prescription medicine. °· Antibiotics, for infection. °· Birth control pills, for painful periods or for ovulation pain. °· Hormone treatment, for endometriosis. °· Nerve blocking injections. °· Physical therapy. °· Antidepressants. °· Counseling with a psychologist or psychiatrist. °· Minor or major surgery. °HOME CARE INSTRUCTIONS  °· Do not take laxatives, unless directed by your caregiver. °· Take over-the-counter pain medicine only if ordered by your caregiver. Do not take aspirin because it can cause an upset stomach or bleeding. °· Try a clear liquid diet (broth or water) as ordered by your caregiver. Slowly move to a bland diet, as tolerated, if the pain is related to the stomach or intestine. °· Have a thermometer and take your temperature several times a day, and record it. °· Bed rest and sleep, if it helps the pain. °· Avoid sexual intercourse, if it causes pain. °· Avoid stressful situations. °· Keep your follow-up appointments and tests, as your caregiver orders. °· If the pain does not go away with medicine or surgery, you may try: °¨ Acupuncture. °¨ Relaxation exercises (yoga, meditation). °¨ Group therapy. °¨ Counseling. °SEEK MEDICAL CARE IF:  °· You notice certain foods cause stomach pain. °· Your home care treatment is not helping your pain. °· You need stronger pain medicine. °· You want your IUD removed. °· You feel faint or   lightheaded. °· You develop nausea and vomiting. °· You develop a rash. °· You are having side effects or an allergy to your medicine. °SEEK IMMEDIATE MEDICAL CARE IF:  °· Your  pain does not go away or gets worse. °· You have a fever. °· Your pain is felt only in portions of the abdomen. The right side could possibly be appendicitis. The left lower portion of the abdomen could be colitis or diverticulitis. °· You are passing blood in your stools (bright red or black tarry stools, with or without vomiting). °· You have blood in your urine. °· You develop chills, with or without a fever. °· You pass out. °MAKE SURE YOU:  °· Understand these instructions. °· Will watch your condition. °· Will get help right away if you are not doing well or get worse. °Document Released: 10/21/2006 Document Revised: 05/10/2013 Document Reviewed: 11/10/2008 °ExitCare® Patient Information ©2015 [employer]. This information is not intended to replace advice given to you by your health care provider. Make sure you discuss any questions you have with your health care provider. ° °

## 2014-03-31 DIAGNOSIS — R51 Headache: Secondary | ICD-10-CM | POA: Diagnosis not present

## 2014-03-31 DIAGNOSIS — G90519 Complex regional pain syndrome I of unspecified upper limb: Secondary | ICD-10-CM | POA: Diagnosis not present

## 2014-03-31 DIAGNOSIS — G894 Chronic pain syndrome: Secondary | ICD-10-CM | POA: Diagnosis not present

## 2014-03-31 DIAGNOSIS — Z79899 Other long term (current) drug therapy: Secondary | ICD-10-CM | POA: Diagnosis not present

## 2014-03-31 DIAGNOSIS — F17210 Nicotine dependence, cigarettes, uncomplicated: Secondary | ICD-10-CM | POA: Diagnosis not present

## 2014-03-31 DIAGNOSIS — E785 Hyperlipidemia, unspecified: Secondary | ICD-10-CM | POA: Diagnosis not present

## 2014-03-31 DIAGNOSIS — Z792 Long term (current) use of antibiotics: Secondary | ICD-10-CM | POA: Diagnosis not present

## 2014-03-31 DIAGNOSIS — Z79891 Long term (current) use of opiate analgesic: Secondary | ICD-10-CM | POA: Diagnosis not present

## 2014-03-31 DIAGNOSIS — F419 Anxiety disorder, unspecified: Secondary | ICD-10-CM | POA: Diagnosis not present

## 2014-03-31 DIAGNOSIS — I10 Essential (primary) hypertension: Secondary | ICD-10-CM | POA: Diagnosis not present

## 2014-03-31 DIAGNOSIS — G90512 Complex regional pain syndrome I of left upper limb: Secondary | ICD-10-CM | POA: Diagnosis not present

## 2014-04-13 DIAGNOSIS — K5730 Diverticulosis of large intestine without perforation or abscess without bleeding: Secondary | ICD-10-CM | POA: Diagnosis not present

## 2014-04-13 DIAGNOSIS — R109 Unspecified abdominal pain: Secondary | ICD-10-CM | POA: Diagnosis not present

## 2014-04-13 DIAGNOSIS — Z90710 Acquired absence of both cervix and uterus: Secondary | ICD-10-CM | POA: Diagnosis not present

## 2014-04-13 DIAGNOSIS — Z720 Tobacco use: Secondary | ICD-10-CM | POA: Diagnosis not present

## 2014-04-13 DIAGNOSIS — R079 Chest pain, unspecified: Secondary | ICD-10-CM | POA: Diagnosis not present

## 2014-04-13 DIAGNOSIS — R1032 Left lower quadrant pain: Secondary | ICD-10-CM | POA: Diagnosis not present

## 2014-04-13 DIAGNOSIS — I10 Essential (primary) hypertension: Secondary | ICD-10-CM | POA: Diagnosis not present

## 2014-04-13 DIAGNOSIS — M549 Dorsalgia, unspecified: Secondary | ICD-10-CM | POA: Diagnosis not present

## 2014-04-13 DIAGNOSIS — K5792 Diverticulitis of intestine, part unspecified, without perforation or abscess without bleeding: Secondary | ICD-10-CM | POA: Diagnosis not present

## 2014-04-14 DIAGNOSIS — R079 Chest pain, unspecified: Secondary | ICD-10-CM | POA: Diagnosis not present

## 2014-04-29 DIAGNOSIS — G8929 Other chronic pain: Secondary | ICD-10-CM | POA: Diagnosis not present

## 2014-04-29 DIAGNOSIS — M545 Low back pain: Secondary | ICD-10-CM | POA: Diagnosis not present

## 2014-04-29 DIAGNOSIS — G90512 Complex regional pain syndrome I of left upper limb: Secondary | ICD-10-CM | POA: Diagnosis not present

## 2014-04-29 DIAGNOSIS — G894 Chronic pain syndrome: Secondary | ICD-10-CM | POA: Diagnosis not present

## 2014-05-09 DIAGNOSIS — S39012A Strain of muscle, fascia and tendon of lower back, initial encounter: Secondary | ICD-10-CM | POA: Diagnosis not present

## 2014-05-09 DIAGNOSIS — I10 Essential (primary) hypertension: Secondary | ICD-10-CM | POA: Diagnosis not present

## 2014-05-09 DIAGNOSIS — R109 Unspecified abdominal pain: Secondary | ICD-10-CM | POA: Diagnosis not present

## 2014-05-09 DIAGNOSIS — R079 Chest pain, unspecified: Secondary | ICD-10-CM | POA: Diagnosis not present

## 2014-05-09 DIAGNOSIS — X58XXXA Exposure to other specified factors, initial encounter: Secondary | ICD-10-CM | POA: Diagnosis not present

## 2014-05-09 DIAGNOSIS — F17200 Nicotine dependence, unspecified, uncomplicated: Secondary | ICD-10-CM | POA: Diagnosis not present

## 2014-05-19 DIAGNOSIS — Z881 Allergy status to other antibiotic agents status: Secondary | ICD-10-CM | POA: Diagnosis not present

## 2014-05-19 DIAGNOSIS — G894 Chronic pain syndrome: Secondary | ICD-10-CM | POA: Diagnosis not present

## 2014-05-19 DIAGNOSIS — F17210 Nicotine dependence, cigarettes, uncomplicated: Secondary | ICD-10-CM | POA: Diagnosis not present

## 2014-05-19 DIAGNOSIS — G90512 Complex regional pain syndrome I of left upper limb: Secondary | ICD-10-CM | POA: Diagnosis not present

## 2014-05-19 DIAGNOSIS — I10 Essential (primary) hypertension: Secondary | ICD-10-CM | POA: Diagnosis not present

## 2014-05-19 DIAGNOSIS — Z888 Allergy status to other drugs, medicaments and biological substances status: Secondary | ICD-10-CM | POA: Diagnosis not present

## 2014-05-19 DIAGNOSIS — G90519 Complex regional pain syndrome I of unspecified upper limb: Secondary | ICD-10-CM | POA: Diagnosis not present

## 2014-06-27 DIAGNOSIS — Z79899 Other long term (current) drug therapy: Secondary | ICD-10-CM | POA: Diagnosis not present

## 2014-06-27 DIAGNOSIS — K5790 Diverticulosis of intestine, part unspecified, without perforation or abscess without bleeding: Secondary | ICD-10-CM | POA: Diagnosis not present

## 2014-06-27 DIAGNOSIS — M5417 Radiculopathy, lumbosacral region: Secondary | ICD-10-CM | POA: Diagnosis not present

## 2014-06-27 DIAGNOSIS — F419 Anxiety disorder, unspecified: Secondary | ICD-10-CM | POA: Diagnosis not present

## 2014-06-27 DIAGNOSIS — F17210 Nicotine dependence, cigarettes, uncomplicated: Secondary | ICD-10-CM | POA: Diagnosis not present

## 2014-06-27 DIAGNOSIS — G894 Chronic pain syndrome: Secondary | ICD-10-CM | POA: Diagnosis not present

## 2014-06-27 DIAGNOSIS — M47815 Spondylosis without myelopathy or radiculopathy, thoracolumbar region: Secondary | ICD-10-CM | POA: Diagnosis not present

## 2014-06-27 DIAGNOSIS — E785 Hyperlipidemia, unspecified: Secondary | ICD-10-CM | POA: Diagnosis not present

## 2014-06-27 DIAGNOSIS — Z888 Allergy status to other drugs, medicaments and biological substances status: Secondary | ICD-10-CM | POA: Diagnosis not present

## 2014-06-27 DIAGNOSIS — Z87442 Personal history of urinary calculi: Secondary | ICD-10-CM | POA: Diagnosis not present

## 2014-07-08 ENCOUNTER — Emergency Department (HOSPITAL_COMMUNITY): Payer: Medicare Other

## 2014-07-08 ENCOUNTER — Encounter (HOSPITAL_BASED_OUTPATIENT_CLINIC_OR_DEPARTMENT_OTHER): Payer: Self-pay

## 2014-07-08 ENCOUNTER — Emergency Department (HOSPITAL_BASED_OUTPATIENT_CLINIC_OR_DEPARTMENT_OTHER)
Admission: EM | Admit: 2014-07-08 | Discharge: 2014-07-08 | Disposition: A | Discharge status: Home / Self Care | Payer: Medicare Other | Attending: Emergency Medicine | Admitting: Emergency Medicine

## 2014-07-08 DIAGNOSIS — M5442 Lumbago with sciatica, left side: Secondary | ICD-10-CM | POA: Insufficient documentation

## 2014-07-08 DIAGNOSIS — M47816 Spondylosis without myelopathy or radiculopathy, lumbar region: Secondary | ICD-10-CM | POA: Diagnosis not present

## 2014-07-08 DIAGNOSIS — M549 Dorsalgia, unspecified: Secondary | ICD-10-CM

## 2014-07-08 DIAGNOSIS — R079 Chest pain, unspecified: Secondary | ICD-10-CM | POA: Diagnosis not present

## 2014-07-08 LAB — URINALYSIS, ROUTINE W REFLEX MICROSCOPIC
GLUCOSE, UA: NEGATIVE mg/dL
Hgb urine dipstick: NEGATIVE
KETONES UR: NEGATIVE mg/dL
Leukocytes, UA: NEGATIVE
Nitrite: NEGATIVE
Protein, ur: NEGATIVE mg/dL
SPECIFIC GRAVITY, URINE: 1.029 (ref 1.005–1.030)
Urobilinogen, UA: 1 mg/dL (ref 0.0–1.0)
pH: 5 (ref 5.0–8.0)

## 2014-07-08 MED ORDER — ONDANSETRON HCL 4 MG/2ML IJ SOLN
4.0000 mg | Freq: Once | INTRAMUSCULAR | Status: AC
  Administered 2014-07-08: 4 mg via INTRAVENOUS
  Filled 2014-07-08: qty 2

## 2014-07-08 MED ORDER — ONDANSETRON HCL 4 MG/2ML IJ SOLN
4.0000 mg | Freq: Once | INTRAMUSCULAR | Status: AC | PRN
  Administered 2014-07-08: 4 mg via INTRAVENOUS
  Filled 2014-07-08: qty 2

## 2014-07-08 MED ORDER — HYDROMORPHONE HCL 1 MG/ML IJ SOLN
1.0000 mg | Freq: Once | INTRAMUSCULAR | Status: AC | PRN
  Administered 2014-07-08: 1 mg via INTRAVENOUS
  Filled 2014-07-08: qty 1

## 2014-07-08 MED ORDER — HYDROMORPHONE HCL 1 MG/ML IJ SOLN
1.0000 mg | Freq: Once | INTRAMUSCULAR | Status: AC
  Administered 2014-07-08: 1 mg via INTRAVENOUS
  Filled 2014-07-08: qty 1

## 2014-07-08 NOTE — ED Notes (Signed)
[facility] Traci RN .

## 2014-07-08 NOTE — ED Provider Notes (Signed)
4:28 PM Pt [facility] MR of back due to saddle anesthesia/back pain.  MR ordered.  No significant changes since last seen [facility].   11:48 PM MRI was negative.  She was able to stand and ambulate.  She had no lower extremity weakness or numbness. She had low back tenderness, no upper back tenderness.  She felt better after pain medication.    Clinical Impression: 1. Left-sided low back pain with left-sided sciatica   2. Back pain       Blake DivineJohn Juliann Olesky, MD 07/08/14 [phone]

## 2014-07-08 NOTE — Discharge Instructions (Signed)
Back Pain, Adult Low back pain is very common. About 1 in 5 people have back pain.The cause of low back pain is rarely dangerous. The pain often gets better over time.About half of people with a sudden onset of back pain feel better in just 2 weeks. About 8 in 10 people feel better by 6 weeks.  CAUSES Some common causes of back pain include:  Strain of the muscles or ligaments supporting the spine.  Wear and tear (degeneration) of the spinal discs.  Arthritis.  Direct injury to the back. DIAGNOSIS Most of the time, the direct cause of low back pain is not known.However, back pain can be treated effectively even when the exact cause of the pain is unknown.Answering your caregiver's questions about your overall health and symptoms is one of the most accurate ways to make sure the cause of your pain is not dangerous. If your caregiver needs more information, he or she may order lab work or imaging tests (X-rays or MRIs).However, even if imaging tests show changes in your back, this usually does not require surgery. HOME CARE INSTRUCTIONS For many people, back pain returns.Since low back pain is rarely dangerous, it is often a condition that people can learn to manageon their own.   Remain active. It is stressful on the back to sit or stand in one place. Do not sit, drive, or stand in one place for more than 30 minutes at a time. Take short walks on level surfaces as soon as pain allows.Try to increase the length of time you walk each day.  Do not stay in bed.Resting more than 1 or 2 days can delay your recovery.  Do not avoid exercise or work.Your body is made to move.It is not dangerous to be active, even though your back may hurt.Your back will likely heal faster if you return to being active before your pain is gone.  Pay attention to your body when you bend and lift. Many people have less discomfortwhen lifting if they bend their knees, keep the load close to their bodies,and  avoid twisting. Often, the most comfortable positions are those that put less stress on your recovering back.  Find a comfortable position to sleep. Use a firm mattress and lie on your side with your knees slightly bent. If you lie on your back, put a pillow under your knees.  Only take over-the-counter or prescription medicines as directed by your caregiver. Over-the-counter medicines to reduce pain and inflammation are often the most helpful.Your caregiver may prescribe muscle relaxant drugs.These medicines help dull your pain so you can more quickly return to your normal activities and healthy exercise.  Put ice on the injured area.  Put ice in a plastic bag.  Place a towel between your skin and the bag.  Leave the ice on for 15-20 minutes, 03-04 times a day for the first 2 to 3 days. After that, ice and heat may be alternated to reduce pain and spasms.  Ask your caregiver about trying back exercises and gentle massage. This may be of some benefit.  Avoid feeling anxious or stressed.Stress increases muscle tension and can worsen back pain.It is important to recognize when you are anxious or stressed and learn ways to manage it.Exercise is a great option. SEEK MEDICAL CARE IF:  You have pain that is not relieved with rest or medicine.  You have pain that does not improve in 1 week.  You have new symptoms.  You are generally not feeling well. SEEK   IMMEDIATE MEDICAL CARE IF:   You have pain that radiates from your back into your legs.  You develop new bowel or bladder control problems.  You have unusual weakness or numbness in your arms or legs.  You develop nausea or vomiting.  You develop abdominal pain.  You feel faint. Document Released: 12/24/2004 Document Revised: 06/25/2011 Document Reviewed: 04/27/2013 ExitCare Patient Information 2015 [employer]. This information is not intended to replace advice given to you by your health care provider. Make sure you  discuss any questions you have with your health care provider.  

## 2014-07-08 NOTE — ED Provider Notes (Addendum)
CSN: [id number]     Arrival date & time 07/08/14  1159 History   First MD Initiated Contact with Patient 07/08/14 1241     Chief Complaint  Patient presents with  . Back Pain     (Consider location/radiation/quality/duration/timing/severity/associated sxs/prior Treatment) HPI Comments: Patient with a history of chronic pain related to RDS presents with low back pain. She states over last 6 months she's had worsening pain to her left lower back. Her pain radiates to her left leg. She states over the last 3-4 days she's had numbness to her private area. She denies any bowel or bladder incontinence. She denies any numbness or weakness to the legs. She was previously in pain management for control of her RDS. She was forced to switch to a new pain management clinic over the last few weeks. She has her appointment to prescribe pain medications this upcoming Tuesday. Currently she is out of her pain medications. She did recently go to emergency department in [place] and was prescribed Dilaudid 2 mg tablets, #30. She states she is out of those as well. This was 10 days ago. She had a CT scan of her lumbar spine on that visit in the ED which showed some minor multi-level disc bulging. She has not had an MRI of her spine. She states that it was recommended that the next step would be to do an MRI of her spine. She has an upcoming appointment with Dr. Renae FicklePaul with [facility]. This appointment is on Tuesday.  Patient is a 49 y.o. female presenting with back pain.  Back Pain Associated symptoms: chest pain   Associated symptoms: no abdominal pain, no fever, no headaches, no numbness and no weakness     Past Medical History  Diagnosis Date  . RSD (reflex sympathetic dystrophy)     a. L shoulder  . Migraine   . Colon polyps     a. s/p colonoscopy and polypectomy in 2003  . Abdominal pain, chronic, left lower quadrant     a. s/p Laproscopy in 2004 w/o finding of source of discomfort.  [name]. GERD  (gastroesophageal reflux disease)   . Hiatal hernia     a. small by EGD 2006.  [name]. Visual disturbance     a. 12/2011 Left eye visual changes->w/u for TIA->Carotid U/S:  No ICA stenosis; Head CT: No acute abnl; MRI/MRA: mild RPCA stenosis; Echo: EF 55-60%, Triv AI, No PFO.  . Tobacco abuse     a. 50 pack year hx.  . Cholelithiasis     a. s/p cholecystectomy 10/2011  . Chest pain     a. reportedly nl MV and echo 10/2011 - [place]  . Diverticulitis    Past Surgical History  Procedure Laterality Date  . Cesarean section    . Shoulder surgery    . Cholecystectomy      a. 10/2011  . Hernia repair     Family History  Problem Relation Age of Onset  . Dementia Father   . Parkinson's disease Father     alive @ 1075  . Heart attack Mother     died @ 6967   History  Substance Use Topics  . Smoking status: Current Every Day Smoker -- 0.50 packs/day for 25 years    Types: Cigarettes  . Smokeless tobacco: Not on file     Comment: Smoked 2ppd for roughly 25 yrs but cut back to 1/2 ppd about 6 mos ago.  . Alcohol Use: No   OB History  No data available     Review of Systems  Constitutional: Negative for fever, chills, diaphoresis and fatigue.  HENT: Negative for congestion, rhinorrhea and sneezing.   Eyes: Negative.   Respiratory: Negative for cough, chest tightness and shortness of breath.   Cardiovascular: Positive for chest pain. Negative for leg swelling.  Gastrointestinal: Negative for nausea, vomiting, abdominal pain, diarrhea and blood in stool.  Genitourinary: Negative for frequency, hematuria, flank pain and difficulty urinating.  Musculoskeletal: Positive for back pain. Negative for arthralgias.  Skin: Negative for rash.  Neurological: Negative for dizziness, speech difficulty, weakness, numbness and headaches.      Allergies  Levaquin  Home Medications   Prior to Admission medications   Medication Sig Start Date End Date Taking? Authorizing Provider  aspirin  81 MG chewable tablet Chew 324 mg by mouth once.    Historical Provider, MD  cephALEXin (KEFLEX) 500 MG capsule Take 1 capsule (500 mg total) by mouth 4 (four) times daily. 09/21/13   Bethann Berkshire, MD  ciprofloxacin (CIPRO) 500 MG tablet Take 1 tablet (500 mg total) by mouth 2 (two) times daily. One po bid x 7 days 02/22/14   Ladona Mow, PA-C  HYDROcodone-acetaminophen (NORCO/VICODIN) 5-325 MG per tablet Take 1 tablet by mouth once as needed for moderate pain.    Historical Provider, MD  lisinopril (PRINIVIL,ZESTRIL) 20 MG tablet Take 1 tablet (20 mg total) by mouth daily. 09/21/13   Bethann Berkshire, MD  metoprolol succinate (TOPROL-XL) 100 MG 24 hr tablet Take 100 mg by mouth daily. Take with or immediately following a meal.    Historical Provider, MD  metroNIDAZOLE (FLAGYL) 500 MG tablet Take 1 tablet (500 mg total) by mouth 2 (two) times daily. One po bid x 7 days 02/22/14   Ladona Mow, PA-C  morphine (KADIAN) 20 MG 24 hr capsule Take 20 mg by mouth daily.    Historical Provider, MD  ondansetron (ZOFRAN ODT) 4 MG disintegrating tablet 4mg  ODT q4 hours prn nausea/vomit 09/21/13   Bethann Berkshire, MD  ondansetron (ZOFRAN) 4 MG tablet Take 1 tablet (4 mg total) by mouth every 6 (six) hours. 11/10/13   Glynn Octave, MD  ondansetron (ZOFRAN) 4 MG tablet Take 1 tablet (4 mg total) by mouth every 8 (eight) hours as needed for nausea or vomiting. 02/22/14   Ladona Mow, PA-C  oxyCODONE-acetaminophen (PERCOCET) 5-325 MG per tablet Take 1-2 tablets by mouth every 6 (six) hours as needed. 02/22/14   Ladona Mow, PA-C  oxyCODONE-acetaminophen (PERCOCET/ROXICET) 5-325 MG per tablet Take 1 tablet by mouth every 6 (six) hours as needed. 09/21/13   Bethann Berkshire, MD   BP 196/113 mmHg  Pulse 126  Temp(Src) 98.5 F (36.9 C) (Oral)  Resp 20  Ht 5\' 3"  (1.6 m)  Wt 172 lb (78.019 kg)  BMI 30.48 kg/m2  SpO2 100% Physical Exam  Constitutional: She is oriented to person, place, and time. She appears well-developed and  well-nourished.  HENT:  Head: Normocephalic and atraumatic.  Eyes: Pupils are equal, round, and reactive to light.  Neck: Normal range of motion. Neck supple.  Cardiovascular: Normal rate, regular rhythm and normal heart sounds.   Pulmonary/Chest: Effort normal and breath sounds normal. No respiratory distress. She has no wheezes. She has no rales. She exhibits no tenderness.  Abdominal: Soft. Bowel sounds are normal. There is no tenderness. There is no rebound and no guarding.  Musculoskeletal: Normal range of motion. She exhibits no edema.  Tenderness to left lower back and left sciatic  area.  +SLR on left.  Normal motor function and sensation to LT in lower extremities.  Normal rectal tone, but diminished sensation to LT in perineum  Lymphadenopathy:    She has no cervical adenopathy.  Neurological: She is alert and oriented to person, place, and time.  Skin: Skin is warm and dry. No rash noted.  Psychiatric: She has a normal mood and affect.    ED Course  Procedures (including critical care time) Labs Review Labs Reviewed - No data to display  Imaging Review No results found.   EKG Interpretation None     ED ECG REPORT   Date: 07/08/2014  Rate: 129  Rhythm: sinus tachycardia  QRS Axis: normal  Intervals: normal  ST/T Wave abnormalities: nonspecific ST/T changes  Conduction Disutrbances:none  Narrative Interpretation:   Old EKG Reviewed: unchanged  I have personally reviewed the EKG tracing and agree with the computerized printout as noted.  MDM   Final diagnoses:  Left-sided low back pain with left-sided sciatica    Pt with lower back pain and associated saddle anesthesia. She has no motor or sensory deficits to the leg. However given her symptoms I feel that she will need an MRI today. We will transfer her [facility] ED [facility] to obtain an MRI. I spoke with the ED MLP on Pod B and pt is accepted by Dr. Donnald Garre  Patient has also complained of some chest pain  in the ED. She describes it is her typical type chest pain that she says she always gets when she's having a lot of pain. She states she had a recent stress test which was normal. Her EKG does not show any ischemic changes. She has no shortness of breath nausea diaphoresis or any other associated symptoms.  Rolan Bucco, MD 07/08/14 1320  Rolan Bucco, MD 07/08/14 1323

## 2014-07-08 NOTE — ED Notes (Signed)
[place] MD made aware of pt arrival.

## 2014-07-08 NOTE — ED Notes (Signed)
Via [facility]--spoke with Phil--to [facility]

## 2014-07-08 NOTE — ED Notes (Addendum)
Pt reports lower back pain from Thoracic Region to Lumbar Region - reports CT last week showed T12-S1 disc bulge. Also reports chest pain, EKG obtained. States pain radiates down left leg. Reports scheduled to see Ortho Dr Renae FicklePaul on Tuesday. Denies urinary retention or incontinence. Pt states that 10 days ago she received 30 Dilaudid tablets but ran out three days ago. Pt drove to ED by her father in law.

## 2014-07-12 DIAGNOSIS — Z79891 Long term (current) use of opiate analgesic: Secondary | ICD-10-CM | POA: Diagnosis not present

## 2014-07-12 DIAGNOSIS — Z79899 Other long term (current) drug therapy: Secondary | ICD-10-CM | POA: Diagnosis not present

## 2014-07-12 DIAGNOSIS — G90519 Complex regional pain syndrome I of unspecified upper limb: Secondary | ICD-10-CM | POA: Diagnosis not present

## 2014-07-12 DIAGNOSIS — M545 Low back pain: Secondary | ICD-10-CM | POA: Diagnosis not present

## 2014-07-12 DIAGNOSIS — G8929 Other chronic pain: Secondary | ICD-10-CM | POA: Diagnosis not present

## 2014-07-28 DIAGNOSIS — Z79891 Long term (current) use of opiate analgesic: Secondary | ICD-10-CM | POA: Diagnosis not present

## 2014-07-29 DIAGNOSIS — K5909 Other constipation: Secondary | ICD-10-CM | POA: Diagnosis not present

## 2014-07-29 DIAGNOSIS — D72828 Other elevated white blood cell count: Secondary | ICD-10-CM | POA: Diagnosis not present

## 2014-07-29 DIAGNOSIS — Z79891 Long term (current) use of opiate analgesic: Secondary | ICD-10-CM | POA: Diagnosis not present

## 2014-07-29 DIAGNOSIS — G8929 Other chronic pain: Secondary | ICD-10-CM | POA: Diagnosis not present

## 2014-07-29 DIAGNOSIS — E785 Hyperlipidemia, unspecified: Secondary | ICD-10-CM | POA: Diagnosis not present

## 2014-07-29 DIAGNOSIS — Z79899 Other long term (current) drug therapy: Secondary | ICD-10-CM | POA: Diagnosis not present

## 2014-07-29 DIAGNOSIS — F419 Anxiety disorder, unspecified: Secondary | ICD-10-CM | POA: Diagnosis not present

## 2014-07-29 DIAGNOSIS — M545 Low back pain: Secondary | ICD-10-CM | POA: Diagnosis not present

## 2014-07-29 DIAGNOSIS — K5730 Diverticulosis of large intestine without perforation or abscess without bleeding: Secondary | ICD-10-CM | POA: Diagnosis not present

## 2014-07-29 DIAGNOSIS — K5900 Constipation, unspecified: Secondary | ICD-10-CM | POA: Diagnosis not present

## 2014-07-29 DIAGNOSIS — R1032 Left lower quadrant pain: Secondary | ICD-10-CM | POA: Diagnosis not present

## 2014-07-29 DIAGNOSIS — Z888 Allergy status to other drugs, medicaments and biological substances status: Secondary | ICD-10-CM | POA: Diagnosis not present

## 2014-07-29 DIAGNOSIS — F17210 Nicotine dependence, cigarettes, uncomplicated: Secondary | ICD-10-CM | POA: Diagnosis not present

## 2014-07-29 DIAGNOSIS — I10 Essential (primary) hypertension: Secondary | ICD-10-CM | POA: Diagnosis not present

## 2014-07-29 DIAGNOSIS — G90512 Complex regional pain syndrome I of left upper limb: Secondary | ICD-10-CM | POA: Diagnosis not present

## 2014-08-06 DIAGNOSIS — R109 Unspecified abdominal pain: Secondary | ICD-10-CM | POA: Diagnosis not present

## 2014-08-06 DIAGNOSIS — D72829 Elevated white blood cell count, unspecified: Secondary | ICD-10-CM | POA: Diagnosis not present

## 2014-08-06 DIAGNOSIS — Z87442 Personal history of urinary calculi: Secondary | ICD-10-CM | POA: Diagnosis not present

## 2014-08-06 DIAGNOSIS — Z8711 Personal history of peptic ulcer disease: Secondary | ICD-10-CM | POA: Diagnosis not present

## 2014-08-06 DIAGNOSIS — F17210 Nicotine dependence, cigarettes, uncomplicated: Secondary | ICD-10-CM | POA: Diagnosis not present

## 2014-08-06 DIAGNOSIS — M79605 Pain in left leg: Secondary | ICD-10-CM | POA: Diagnosis not present

## 2014-08-06 DIAGNOSIS — F419 Anxiety disorder, unspecified: Secondary | ICD-10-CM | POA: Diagnosis not present

## 2014-08-06 DIAGNOSIS — R1032 Left lower quadrant pain: Secondary | ICD-10-CM | POA: Diagnosis not present

## 2014-08-06 DIAGNOSIS — M545 Low back pain: Secondary | ICD-10-CM | POA: Diagnosis not present

## 2014-08-06 DIAGNOSIS — Z79899 Other long term (current) drug therapy: Secondary | ICD-10-CM | POA: Diagnosis not present

## 2014-08-06 DIAGNOSIS — G894 Chronic pain syndrome: Secondary | ICD-10-CM | POA: Diagnosis not present

## 2014-08-11 DIAGNOSIS — G8929 Other chronic pain: Secondary | ICD-10-CM | POA: Diagnosis not present

## 2014-08-11 DIAGNOSIS — Z79899 Other long term (current) drug therapy: Secondary | ICD-10-CM | POA: Diagnosis not present

## 2014-08-11 DIAGNOSIS — G90519 Complex regional pain syndrome I of unspecified upper limb: Secondary | ICD-10-CM | POA: Diagnosis not present

## 2014-08-11 DIAGNOSIS — M5416 Radiculopathy, lumbar region: Secondary | ICD-10-CM | POA: Diagnosis not present

## 2014-08-25 ENCOUNTER — Emergency Department (HOSPITAL_BASED_OUTPATIENT_CLINIC_OR_DEPARTMENT_OTHER)
Admission: EM | Admit: 2014-08-25 | Discharge: 2014-08-25 | Disposition: A | Discharge status: Home / Self Care | Payer: Medicare Other | Attending: Emergency Medicine | Admitting: Emergency Medicine

## 2014-08-25 ENCOUNTER — Encounter (HOSPITAL_BASED_OUTPATIENT_CLINIC_OR_DEPARTMENT_OTHER): Payer: Self-pay

## 2014-08-25 DIAGNOSIS — Z720 Tobacco use: Secondary | ICD-10-CM | POA: Insufficient documentation

## 2014-08-25 DIAGNOSIS — Z8669 Personal history of other diseases of the nervous system and sense organs: Secondary | ICD-10-CM | POA: Diagnosis not present

## 2014-08-25 DIAGNOSIS — G8929 Other chronic pain: Secondary | ICD-10-CM | POA: Diagnosis not present

## 2014-08-25 DIAGNOSIS — G43909 Migraine, unspecified, not intractable, without status migrainosus: Secondary | ICD-10-CM | POA: Diagnosis not present

## 2014-08-25 DIAGNOSIS — Z86010 Personal history of colonic polyps: Secondary | ICD-10-CM | POA: Insufficient documentation

## 2014-08-25 DIAGNOSIS — Z3202 Encounter for pregnancy test, result negative: Secondary | ICD-10-CM | POA: Insufficient documentation

## 2014-08-25 DIAGNOSIS — K5732 Diverticulitis of large intestine without perforation or abscess without bleeding: Secondary | ICD-10-CM | POA: Insufficient documentation

## 2014-08-25 DIAGNOSIS — Z79899 Other long term (current) drug therapy: Secondary | ICD-10-CM | POA: Insufficient documentation

## 2014-08-25 DIAGNOSIS — K5730 Diverticulosis of large intestine without perforation or abscess without bleeding: Secondary | ICD-10-CM | POA: Diagnosis not present

## 2014-08-25 DIAGNOSIS — R109 Unspecified abdominal pain: Secondary | ICD-10-CM | POA: Diagnosis present

## 2014-08-25 LAB — COMPREHENSIVE METABOLIC PANEL
ALBUMIN: 3.9 g/dL (ref 3.5–5.0)
ALK PHOS: 82 U/L (ref 38–126)
ALT: 9 U/L — ABNORMAL LOW (ref 14–54)
ANION GAP: 10 (ref 5–15)
AST: 15 U/L (ref 15–41)
BILIRUBIN TOTAL: 0.4 mg/dL (ref 0.3–1.2)
BUN: 13 mg/dL (ref 6–20)
CALCIUM: 9.4 mg/dL (ref 8.9–10.3)
CO2: 27 mmol/L (ref 22–32)
Chloride: 101 mmol/L (ref 101–111)
Creatinine, Ser: 0.8 mg/dL (ref 0.44–1.00)
GFR calc Af Amer: >60 mL/min (ref >60)
GFR calc non Af Amer: >60 mL/min (ref >60)
GLUCOSE: 142 mg/dL — AB (ref 65–99)
Potassium: 3.7 mmol/L (ref 3.5–5.1)
SODIUM: 138 mmol/L (ref 135–145)
Total Protein: 7.6 g/dL (ref 6.5–8.1)

## 2014-08-25 LAB — CBC
HEMATOCRIT: 41.9 % (ref 36.0–46.0)
HEMOGLOBIN: 14.3 g/dL (ref 12.0–15.0)
MCH: 31.6 pg (ref 26.0–34.0)
MCHC: 34.1 g/dL (ref 30.0–36.0)
MCV: 92.5 fL (ref 78.0–100.0)
Platelets: 376 10*3/uL (ref 150–400)
RBC: 4.53 MIL/uL (ref 3.87–5.11)
RDW: 14 % (ref 11.5–15.5)
WBC: 11.4 10*3/uL — ABNORMAL HIGH (ref 4.0–10.5)

## 2014-08-25 LAB — URINALYSIS, ROUTINE W REFLEX MICROSCOPIC
BILIRUBIN URINE: NEGATIVE
GLUCOSE, UA: NEGATIVE mg/dL
HGB URINE DIPSTICK: NEGATIVE
Ketones, ur: NEGATIVE mg/dL
Leukocytes, UA: NEGATIVE
Nitrite: NEGATIVE
Protein, ur: NEGATIVE mg/dL
SPECIFIC GRAVITY, URINE: 1.012 (ref 1.005–1.030)
Urobilinogen, UA: 0.2 mg/dL (ref 0.0–1.0)
pH: 6 (ref 5.0–8.0)

## 2014-08-25 LAB — PREGNANCY, URINE: Preg Test, Ur: NEGATIVE

## 2014-08-25 LAB — LIPASE, BLOOD: Lipase: 20 U/L — ABNORMAL LOW (ref 22–51)

## 2014-08-25 MED ORDER — METRONIDAZOLE 500 MG PO TABS
500.0000 mg | ORAL_TABLET | Freq: Two times a day (BID) | ORAL | Status: DC
Start: 2014-08-25 — End: 2014-08-31

## 2014-08-25 MED ORDER — HYDROMORPHONE HCL 1 MG/ML IJ SOLN
1.0000 mg | INTRAMUSCULAR | Status: AC | PRN
  Administered 2014-08-25 (×2): 1 mg via INTRAVENOUS
  Filled 2014-08-25 (×2): qty 1

## 2014-08-25 MED ORDER — METRONIDAZOLE IN NACL 5-0.79 MG/ML-% IV SOLN
500.0000 mg | Freq: Once | INTRAVENOUS | Status: AC
  Administered 2014-08-25: 500 mg via INTRAVENOUS

## 2014-08-25 MED ORDER — ONDANSETRON 4 MG PO TBDP: 4.0000 mg | ORAL_TABLET | Freq: Three times a day (TID) | ORAL | Status: DC | PRN

## 2014-08-25 MED ORDER — ONDANSETRON HCL 4 MG/2ML IJ SOLN
4.0000 mg | Freq: Once | INTRAMUSCULAR | Status: AC
  Administered 2014-08-25: 4 mg via INTRAVENOUS
  Filled 2014-08-25: qty 2

## 2014-08-25 MED ORDER — CIPROFLOXACIN HCL 500 MG PO TABS
500.0000 mg | ORAL_TABLET | Freq: Once | ORAL | Status: AC
  Administered 2014-08-25: 500 mg via ORAL
  Filled 2014-08-25: qty 1

## 2014-08-25 MED ORDER — CIPROFLOXACIN HCL 500 MG PO TABS: 500.0000 mg | ORAL_TABLET | Freq: Two times a day (BID) | ORAL | Status: DC

## 2014-08-25 NOTE — ED Notes (Signed)
Pt left with 3 rx for hospital pharmacy

## 2014-08-25 NOTE — Discharge Instructions (Signed)
Return to ER with any worsening symptoms.  Diverticulitis Diverticulitis is inflammation or infection of small pouches in your colon that form when you have a condition called diverticulosis. The pouches in your colon are called diverticula. Your colon, or large intestine, is where water is absorbed and stool is formed. Complications of diverticulitis can include:  Bleeding.  Severe infection.  Severe pain.  Perforation of your colon.  Obstruction of your colon. CAUSES  Diverticulitis is caused by bacteria. Diverticulitis happens when stool becomes trapped in diverticula. This allows bacteria to grow in the diverticula, which can lead to inflammation and infection. RISK FACTORS People with diverticulosis are at risk for diverticulitis. Eating a diet that does not include enough fiber from fruits and vegetables may make diverticulitis more likely to develop. SYMPTOMS  Symptoms of diverticulitis may include:  Abdominal pain and tenderness. The pain is normally located on the left side of the abdomen, but may occur in other areas.  Fever and chills.  Bloating.  Cramping.  Nausea.  Vomiting.  Constipation.  Diarrhea.  Blood in your stool. DIAGNOSIS  Your health care provider will ask you about your medical history and do a physical exam. You may need to have tests done because many medical conditions can cause the same symptoms as diverticulitis. Tests may include:  Blood tests.  Urine tests.  Imaging tests of the abdomen, including X-rays and CT scans. When your condition is under control, your health care provider may recommend that you have a colonoscopy. A colonoscopy can show how severe your diverticula are and whether something else is causing your symptoms. TREATMENT  Most cases of diverticulitis are mild and can be treated at home. Treatment may include:  Taking over-the-counter pain medicines.  Following a clear liquid diet.  Taking antibiotic medicines by  mouth for 7-10 days. More severe cases may be treated at a hospital. Treatment may include:  Not eating or drinking.  Taking prescription pain medicine.  Receiving antibiotic medicines through an IV tube.  Receiving fluids and nutrition through an IV tube.  Surgery. HOME CARE INSTRUCTIONS   Follow your health care provider's instructions carefully.  Follow a full liquid diet or other diet as directed by your health care provider. After your symptoms improve, your health care provider may tell you to change your diet. He or she may recommend you eat a high-fiber diet. Fruits and vegetables are good sources of fiber. Fiber makes it easier to pass stool.  Take fiber supplements or probiotics as directed by your health care provider.  Only take medicines as directed by your health care provider.  Keep all your follow-up appointments. SEEK MEDICAL CARE IF:   Your pain does not improve.  You have a hard time eating food.  Your bowel movements do not return to normal. SEEK IMMEDIATE MEDICAL CARE IF:   Your pain becomes worse.  Your symptoms do not get better.  Your symptoms suddenly get worse.  You have a fever.  You have repeated vomiting.  You have bloody or black, tarry stools. MAKE SURE YOU:   Understand these instructions.  Will watch your condition.  Will get help right away if you are not doing well or get worse. Document Released: 10/03/2004 Document Revised: 12/29/2012 Document Reviewed: 11/18/2012 [facility] Patient Information 2015 [place], [place]. This information is not intended to replace advice given to you by your health care provider. Make sure you discuss any questions you have with your health care provider.

## 2014-08-25 NOTE — ED Notes (Addendum)
Per pt " feels like diverticulitis". Has hx of diverticulitis. Complains of pain and loose stools 3/4x daily. Has taken Zofran and Dilaudid at home but no relief

## 2014-08-25 NOTE — ED Provider Notes (Signed)
CSN: [id number]     Arrival date & time 08/25/14  1353 History   First MD Initiated Contact with Patient 08/25/14 1435     Chief Complaint  Patient presents with  . Abdominal Pain      HPI  Patient presents for evaluation of abdominal pain. He said multiple episodes of diverticulitis over her lifetime starting at age 49. His current symptoms started 2 days ago. She has pain medication at home. States that she is taking the pain medicine. However her symptoms seem to be worsening and she had some nausea today and fever last night. Typically gets left groin and left lower quadrant pain. She does have worsening of her chronic back pain but reiterates that she gets pain medicine from primary care and for pain management for this.  Past Medical History  Diagnosis Date  . RSD (reflex sympathetic dystrophy)     a. L shoulder  . Migraine   . Colon polyps     a. s/p colonoscopy and polypectomy in 2003  . Abdominal pain, chronic, left lower quadrant     a. s/p Laproscopy in 2004 w/o finding of source of discomfort.  [name] GERD (gastroesophageal reflux disease)   . Hiatal hernia     a. small by EGD 2006.  [name] Visual disturbance     a. 12/2011 Left eye visual changes->w/u for TIA->Carotid U/S:  No ICA stenosis; Head CT: No acute abnl; MRI/MRA: mild RPCA stenosis; Echo: EF 55-60%, Triv AI, No PFO.  . Tobacco abuse     a. 50 pack year hx.  . Cholelithiasis     a. s/p cholecystectomy 10/2011  . Chest pain     a. reportedly nl MV and echo 10/2011 - [place]  . Diverticulitis    Past Surgical History  Procedure Laterality Date  . Cesarean section    . Shoulder surgery    . Cholecystectomy      a. 10/2011  . Hernia repair     Family History  Problem Relation Age of Onset  . Dementia Father   . Parkinson's disease Father     alive @ 29  . Heart attack Mother     died @ 75   Social History  Substance Use Topics  . Smoking status: Current Every Day Smoker -- 0.50 packs/day for 25 years      Types: Cigarettes  . Smokeless tobacco: None     Comment: Smoked 2ppd for roughly 25 yrs but cut back to 1/2 ppd about 6 mos ago.  . Alcohol Use: No   OB History    No data available     Review of Systems  Constitutional: Positive for fever. Negative for chills, diaphoresis, appetite change and fatigue.  HENT: Negative for mouth sores, sore throat and trouble swallowing.   Eyes: Negative for visual disturbance.  Respiratory: Negative for cough, chest tightness, shortness of breath and wheezing.   Cardiovascular: Negative for chest pain.  Gastrointestinal: Positive for nausea and abdominal pain. Negative for vomiting, diarrhea and abdominal distention.  Endocrine: Negative for polydipsia, polyphagia and polyuria.  Genitourinary: Negative for dysuria, frequency and hematuria.  Musculoskeletal: Negative for gait problem.  Skin: Negative for color change, pallor and rash.  Neurological: Negative for dizziness, syncope, light-headedness and headaches.  Hematological: Does not bruise/bleed easily.  Psychiatric/Behavioral: Negative for behavioral problems and confusion.      Allergies  Elavil; Klonopin; and Levaquin  Home Medications   Prior to Admission medications   Medication Sig Start Date End  Date Taking? Authorizing Provider  HYDROmorphone (DILAUDID) 2 MG tablet Take by mouth 3 (three) times daily.   Yes Historical Provider, MD  ondansetron (ZOFRAN) 4 MG tablet Take 4 mg by mouth every 8 (eight) hours as needed for nausea or vomiting.   Yes Historical Provider, MD  ciprofloxacin (CIPRO) 500 MG tablet Take 1 tablet (500 mg total) by mouth every 12 (twelve) hours. 08/25/14   Rolland Porter, MD  metoprolol succinate (TOPROL-XL) 100 MG 24 hr tablet Take 100 mg by mouth daily. Take with or immediately following a meal.    Historical Provider, MD  metroNIDAZOLE (FLAGYL) 500 MG tablet Take 1 tablet (500 mg total) by mouth 2 (two) times daily. 08/25/14   Rolland Porter, MD  ondansetron  (ZOFRAN ODT) 4 MG disintegrating tablet Take 1 tablet (4 mg total) by mouth every 8 (eight) hours as needed for nausea. 08/25/14   Rolland Porter, MD   BP 144/87 mmHg  Pulse 105  Temp(Src) 98.1 F (36.7 C) (Oral)  Resp 18  Ht 5\' 3"  (1.6 m)  Wt 172 lb (78.019 kg)  BMI 30.48 kg/m2  SpO2 97% Physical Exam  Constitutional: She is oriented to person, place, and time. She appears well-developed and well-nourished. No distress.  HENT:  Head: Normocephalic.  Eyes: Conjunctivae are normal. Pupils are equal, round, and reactive to light. No scleral icterus.  Neck: Normal range of motion. Neck supple. No thyromegaly present.  Cardiovascular: Normal rate and regular rhythm.  Exam reveals no gallop and no friction rub.   No murmur heard. Pulmonary/Chest: Effort normal and breath sounds normal. No respiratory distress. She has no wheezes. She has no rales.  Abdominal: Soft. Bowel sounds are normal. She exhibits no distension. There is tenderness. There is no rebound.    Musculoskeletal: Normal range of motion.  Neurological: She is alert and oriented to person, place, and time.  Skin: Skin is warm and dry. No rash noted.  Psychiatric: She has a normal mood and affect. Her behavior is normal.    ED Course  Procedures (including critical care time) Labs Review Labs Reviewed  LIPASE, BLOOD - Abnormal; Notable for the following:    Lipase 20 (*)    All other components within normal limits  COMPREHENSIVE METABOLIC PANEL - Abnormal; Notable for the following:    Glucose, Bld 142 (*)    ALT 9 (*)    All other components within normal limits  CBC - Abnormal; Notable for the following:    WBC 11.4 (*)    All other components within normal limits  PREGNANCY, URINE  URINALYSIS, ROUTINE W REFLEX MICROSCOPIC (NOT AT [facility])    Imaging Review No results found. I have personally reviewed and evaluated these images and lab results as part of my medical decision-making.   EKG Interpretation None       MDM   Final diagnoses:  Diverticulitis of large intestine without perforation or abscess without bleeding    Has a non-surgical abdomen. No peritoneal irritation. No pain with ambulation. I take it is reasonable to treat her. Clear for diverticulitis. Labs are pending. Plan an IV fluids, medications, antibiotics and discharge.    Rolland Porter, MD 08/26/14 [phone]

## 2014-08-25 NOTE — ED Notes (Signed)
abd pain x 2-3 days and loose stools-feels like divertuiculitis

## 2014-08-26 DIAGNOSIS — Z79899 Other long term (current) drug therapy: Secondary | ICD-10-CM | POA: Diagnosis not present

## 2014-08-26 DIAGNOSIS — G8929 Other chronic pain: Secondary | ICD-10-CM | POA: Diagnosis not present

## 2014-08-26 DIAGNOSIS — G90519 Complex regional pain syndrome I of unspecified upper limb: Secondary | ICD-10-CM | POA: Diagnosis not present

## 2014-08-26 DIAGNOSIS — M5416 Radiculopathy, lumbar region: Secondary | ICD-10-CM | POA: Diagnosis not present

## 2014-08-26 DIAGNOSIS — M545 Low back pain: Secondary | ICD-10-CM | POA: Diagnosis not present

## 2014-08-29 ENCOUNTER — Emergency Department (HOSPITAL_BASED_OUTPATIENT_CLINIC_OR_DEPARTMENT_OTHER)
Admission: EM | Admit: 2014-08-29 | Discharge: 2014-08-29 | Disposition: A | Discharge status: Home / Self Care | Payer: Medicare Other | Attending: Emergency Medicine | Admitting: Emergency Medicine

## 2014-08-29 ENCOUNTER — Emergency Department (HOSPITAL_BASED_OUTPATIENT_CLINIC_OR_DEPARTMENT_OTHER): Payer: Medicare Other

## 2014-08-29 ENCOUNTER — Encounter (HOSPITAL_BASED_OUTPATIENT_CLINIC_OR_DEPARTMENT_OTHER): Payer: Self-pay

## 2014-08-29 DIAGNOSIS — R197 Diarrhea, unspecified: Secondary | ICD-10-CM | POA: Insufficient documentation

## 2014-08-29 DIAGNOSIS — Z86010 Personal history of colonic polyps: Secondary | ICD-10-CM | POA: Insufficient documentation

## 2014-08-29 DIAGNOSIS — R112 Nausea with vomiting, unspecified: Secondary | ICD-10-CM | POA: Diagnosis not present

## 2014-08-29 DIAGNOSIS — Z79899 Other long term (current) drug therapy: Secondary | ICD-10-CM | POA: Diagnosis not present

## 2014-08-29 DIAGNOSIS — Z8679 Personal history of other diseases of the circulatory system: Secondary | ICD-10-CM | POA: Diagnosis not present

## 2014-08-29 DIAGNOSIS — Z8719 Personal history of other diseases of the digestive system: Secondary | ICD-10-CM | POA: Diagnosis not present

## 2014-08-29 DIAGNOSIS — Z720 Tobacco use: Secondary | ICD-10-CM | POA: Insufficient documentation

## 2014-08-29 DIAGNOSIS — G8929 Other chronic pain: Secondary | ICD-10-CM | POA: Insufficient documentation

## 2014-08-29 DIAGNOSIS — R1032 Left lower quadrant pain: Secondary | ICD-10-CM | POA: Insufficient documentation

## 2014-08-29 DIAGNOSIS — K5730 Diverticulosis of large intestine without perforation or abscess without bleeding: Secondary | ICD-10-CM | POA: Diagnosis not present

## 2014-08-29 HISTORY — DX: Essential (primary) hypertension: I10

## 2014-08-29 LAB — URINALYSIS, ROUTINE W REFLEX MICROSCOPIC
Bilirubin Urine: NEGATIVE
Glucose, UA: NEGATIVE mg/dL
HGB URINE DIPSTICK: NEGATIVE
Ketones, ur: NEGATIVE mg/dL
Nitrite: NEGATIVE
Protein, ur: NEGATIVE mg/dL
SPECIFIC GRAVITY, URINE: 1.031 — AB (ref 1.005–1.030)
UROBILINOGEN UA: 1 mg/dL (ref 0.0–1.0)
pH: 6 (ref 5.0–8.0)

## 2014-08-29 LAB — CBC WITH DIFFERENTIAL/PLATELET
BAND NEUTROPHILS: 0 % (ref 0–10)
BASOS ABS: 0 10*3/uL (ref 0.0–0.1)
Basophils Relative: 0 % (ref 0–1)
Blasts: 0 %
EOS ABS: 0.3 10*3/uL (ref 0.0–0.7)
EOS PCT: 2 % (ref 0–5)
HCT: 42.6 % (ref 36.0–46.0)
Hemoglobin: 14.5 g/dL (ref 12.0–15.0)
LYMPHS ABS: 4 10*3/uL (ref 0.7–4.0)
Lymphocytes Relative: 30 % (ref 12–46)
MCH: 31.8 pg (ref 26.0–34.0)
MCHC: 34 g/dL (ref 30.0–36.0)
MCV: 93.4 fL (ref 78.0–100.0)
METAMYELOCYTES PCT: 2 %
MONO ABS: 0.8 10*3/uL (ref 0.1–1.0)
MONOS PCT: 6 % (ref 3–12)
Myelocytes: 0 %
Neutro Abs: 8.3 10*3/uL — ABNORMAL HIGH (ref 1.7–7.7)
Neutrophils Relative %: 60 % (ref 43–77)
PLATELETS: 324 10*3/uL (ref 150–400)
Promyelocytes Absolute: 0 %
RBC: 4.56 MIL/uL (ref 3.87–5.11)
RDW: 14.2 % (ref 11.5–15.5)
WBC: 13.4 10*3/uL — ABNORMAL HIGH (ref 4.0–10.5)
nRBC: 0 /100 WBC

## 2014-08-29 LAB — COMPREHENSIVE METABOLIC PANEL
ALBUMIN: 3.9 g/dL (ref 3.5–5.0)
ALT: 9 U/L — AB (ref 14–54)
AST: 14 U/L — AB (ref 15–41)
Alkaline Phosphatase: 74 U/L (ref 38–126)
Anion gap: 11 (ref 5–15)
BUN: 17 mg/dL (ref 6–20)
CHLORIDE: 105 mmol/L (ref 101–111)
CO2: 24 mmol/L (ref 22–32)
CREATININE: 0.88 mg/dL (ref 0.44–1.00)
Calcium: 9.1 mg/dL (ref 8.9–10.3)
GFR calc non Af Amer: >60 mL/min (ref >60)
Glucose, Bld: 88 mg/dL (ref 65–99)
Potassium: 4.1 mmol/L (ref 3.5–5.1)
SODIUM: 140 mmol/L (ref 135–145)
Total Bilirubin: 0.3 mg/dL (ref 0.3–1.2)
Total Protein: 7.3 g/dL (ref 6.5–8.1)

## 2014-08-29 LAB — URINE MICROSCOPIC-ADD ON

## 2014-08-29 MED ORDER — HYDROMORPHONE HCL 1 MG/ML IJ SOLN
1.0000 mg | Freq: Once | INTRAMUSCULAR | Status: AC
  Administered 2014-08-29: 1 mg via INTRAVENOUS
  Filled 2014-08-29: qty 1

## 2014-08-29 MED ORDER — ONDANSETRON HCL 4 MG/2ML IJ SOLN
4.0000 mg | Freq: Once | INTRAMUSCULAR | Status: AC
  Administered 2014-08-29: 4 mg via INTRAVENOUS
  Filled 2014-08-29: qty 2

## 2014-08-29 MED ORDER — IOHEXOL 300 MG/ML  SOLN
100.0000 mL | Freq: Once | INTRAMUSCULAR | Status: AC | PRN
  Administered 2014-08-29: 100 mL via INTRAVENOUS

## 2014-08-29 MED ORDER — IOHEXOL 300 MG/ML  SOLN
50.0000 mL | Freq: Once | INTRAMUSCULAR | Status: AC | PRN
  Administered 2014-08-29: 50 mL via ORAL

## 2014-08-29 MED ORDER — ONDANSETRON HCL 4 MG/2ML IJ SOLN
4.0000 mg | Freq: Once | INTRAMUSCULAR | Status: AC
Start: 2014-08-29 — End: 2014-08-29
  Administered 2014-08-29: 4 mg via INTRAVENOUS
  Filled 2014-08-29: qty 2

## 2014-08-29 NOTE — ED Provider Notes (Signed)
CSN: [id number]     Arrival date & time 08/29/14  1349 History   First MD Initiated Contact with Patient 08/29/14 1411     Chief Complaint  Patient presents with  . Emesis     (Consider location/radiation/quality/duration/timing/severity/associated sxs/prior Treatment) HPI Comments: Patient presents with ongoing left lower quadrant abdominal pain. Patient was seen for this in the emergency department on 08/25/14 and treated with Cipro and Flagyl. She had been having symptoms for 2 days at that time. She states she has had intermittent episodes of recurrent diverticulitis in the past. She's never had any abscesses or perforations. She has had a history of gallbladder removal as well as a hernia repair with mesh placement. She denies any fevers with this. Patient states that she started vomiting after she started taking the antibiotics. She is on chronic pain medications at home and has been able to keep these down and does not think that she is withdrawing. Otherwise she does vomit after eating most solids and liquids. No blood in the stool. No urinary symptoms. No other treatments prior to arrival. Symptoms are severe and similar previous symptoms. Patient states that she was hospitalized at [facility] in the recent past for one week with a flare of diverticulitis pain. She states that her CT scan at that time was negative.  The history is provided by the patient and medical records.    Past Medical History  Diagnosis Date  . RSD (reflex sympathetic dystrophy)     a. L shoulder  . Migraine   . Colon polyps     a. s/p colonoscopy and polypectomy in 2003  . Abdominal pain, chronic, left lower quadrant     a. s/p Laproscopy in 2004 w/o finding of source of discomfort.  [name] GERD (gastroesophageal reflux disease)   . Hiatal hernia     a. small by EGD 2006.  [name] Visual disturbance     a. 12/2011 Left eye visual changes->w/u for TIA->Carotid U/S:  No ICA stenosis; Head CT: No acute abnl; MRI/MRA:  mild RPCA stenosis; Echo: EF 55-60%, Triv AI, No PFO.  . Tobacco abuse     a. 50 pack year hx.  . Cholelithiasis     a. s/p cholecystectomy 10/2011  . Chest pain     a. reportedly nl MV and echo 10/2011 - [place]  . Diverticulitis    Past Surgical History  Procedure Laterality Date  . Cesarean section    . Shoulder surgery    . Cholecystectomy      a. 10/2011  . Hernia repair     Family History  Problem Relation Age of Onset  . Dementia Father   . Parkinson's disease Father     alive @ 32  . Heart attack Mother     died @ 31   Social History  Substance Use Topics  . Smoking status: Current Every Day Smoker -- 0.50 packs/day for 25 years    Types: Cigarettes  . Smokeless tobacco: None     Comment: Smoked 2ppd for roughly 25 yrs but cut back to 1/2 ppd about 6 mos ago.  . Alcohol Use: No   OB History    No data available     Review of Systems  Constitutional: Negative for fever.  HENT: Negative for rhinorrhea and sore throat.   Eyes: Negative for redness.  Respiratory: Negative for cough.   Cardiovascular: Negative for chest pain.  Gastrointestinal: Positive for nausea, vomiting, abdominal pain and diarrhea. Negative for blood in  stool.  Genitourinary: Negative for dysuria, frequency, hematuria, vaginal bleeding and vaginal discharge.  Musculoskeletal: Negative for myalgias.  Skin: Negative for rash.  Neurological: Negative for headaches.      Allergies  Elavil; Klonopin; and Levaquin  Home Medications   Prior to Admission medications   Medication Sig Start Date End Date Taking? Authorizing Provider  ciprofloxacin (CIPRO) 500 MG tablet Take 1 tablet (500 mg total) by mouth every 12 (twelve) hours. 08/25/14   Rolland Porter, MD  HYDROmorphone (DILAUDID) 2 MG tablet Take by mouth 3 (three) times daily.    Historical Provider, MD  metoprolol succinate (TOPROL-XL) 100 MG 24 hr tablet Take 100 mg by mouth daily. Take with or immediately following a meal.     Historical Provider, MD  metroNIDAZOLE (FLAGYL) 500 MG tablet Take 1 tablet (500 mg total) by mouth 2 (two) times daily. 08/25/14   Rolland Porter, MD  ondansetron (ZOFRAN ODT) 4 MG disintegrating tablet Take 1 tablet (4 mg total) by mouth every 8 (eight) hours as needed for nausea. 08/25/14   Rolland Porter, MD  ondansetron (ZOFRAN) 4 MG tablet Take 4 mg by mouth every 8 (eight) hours as needed for nausea or vomiting.    Historical Provider, MD   BP 185/89 mmHg  Pulse 82  Temp(Src) 99.4 F (37.4 C) (Oral)  Resp 18  Ht 5\' 3"  (1.6 m)  Wt 172 lb (78.019 kg)  BMI 30.48 kg/m2  SpO2 100% Physical Exam  Constitutional: She appears well-developed and well-nourished. She appears distressed.  HENT:  Head: Normocephalic and atraumatic.  Eyes: Conjunctivae are normal. Right eye exhibits no discharge. Left eye exhibits no discharge.  Neck: Normal range of motion. Neck supple.  Cardiovascular: Normal rate, regular rhythm and normal heart sounds.   Pulmonary/Chest: Effort normal and breath sounds normal.  Abdominal: Soft. She exhibits no distension. Bowel sounds are decreased. There is tenderness in the left lower quadrant. There is no rebound and no guarding.  Neurological: She is alert.  Skin: Skin is warm and dry.  Psychiatric: She has a normal mood and affect.  Nursing note and vitals reviewed.   ED Course  Procedures (including critical care time) Labs Review Labs Reviewed  CBC WITH DIFFERENTIAL/PLATELET - Abnormal; Notable for the following:    WBC 13.4 (*)    Neutro Abs 8.3 (*)    All other components within normal limits  COMPREHENSIVE METABOLIC PANEL - Abnormal; Notable for the following:    AST 14 (*)    ALT 9 (*)    All other components within normal limits  URINALYSIS, ROUTINE W REFLEX MICROSCOPIC (NOT AT [facility]) - Abnormal; Notable for the following:    Color, Urine AMBER (*)    Specific Gravity, Urine 1.031 (*)    Leukocytes, UA TRACE (*)    All other components within normal limits   URINE MICROSCOPIC-ADD ON - Abnormal; Notable for the following:    Bacteria, UA MANY (*)    All other components within normal limits    Imaging Review Ct Abdomen Pelvis W Contrast  08/29/2014   CLINICAL DATA:  Diverticulitis 08/25/2014. Continued nausea, vomiting, diarrhea and pain. Cannot tolerate antibiotics.  EXAM: CT ABDOMEN AND PELVIS WITH CONTRAST  TECHNIQUE: Multidetector CT imaging of the abdomen and pelvis was performed using the standard protocol following bolus administration of intravenous contrast. Unable to raise arms due to RSD.  CONTRAST:  OMNIPAQUE IOHEXOL 300 MG/ML SOLN, 50mL OMNIPAQUE IOHEXOL 300 MG/ML SOLN  COMPARISON:  05/09/2014  FINDINGS: Lung  bases are within normal.  Abdominal images demonstrate evidence of a previous cholecystectomy. There is mild stable post cholecystectomy prominence of the common bile duct. The liver, spleen, pancreas and adrenal glands are within normal. Kidneys normal size without hydronephrosis or nephrolithiasis. Minimal cortical scarring over the mid to upper pole of the left kidney unchanged. Ureters are within normal. Appendix is normal.  There is mild calcified plaque over the abdominal aorta iliac arteries.  There is diverticulosis throughout the colon. There is no active inflammation or free fluid. No evidence of free peritoneal air. Small bowel is normal in caliber. Evidence of postsurgical change over the midline anterior abdominal wall.  Pelvic images demonstrate the uterus, ovaries, bladder and rectum to be within normal. There is no free fluid.  There is minimal spondylosis of the spine a mild degenerate change of the hips.  IMPRESSION: No acute findings in the abdomen/pelvis.  Diverticulosis throughout the colon without evidence of active information.   Electronically Signed   By: Elberta Fortis M.D.   On: 08/29/2014 16:22   I have personally reviewed and evaluated these images and lab results as part of my medical decision-making.    EKG Interpretation None       2:34 PM Patient seen and examined. Work-up initiated. Medications ordered.   Vital signs reviewed and are as follows: BP 185/89 mmHg  Pulse 82  Temp(Src) 99.4 F (37.4 C) (Oral)  Resp 18  Ht 5\' 3"  (1.6 m)  Wt 172 lb (78.019 kg)  BMI 30.48 kg/m2  SpO2 100%  5:15 PM Patient tolerated contrast and is not vomiting. She is informed of CT results.   Plan: continue home pain and nausea medications. Will d/c antibiotics given CT findings. She is to follow-up with PCP in 2 days as already scheduled.   The patient was urged to return to the Emergency Department immediately with worsening of current symptoms, worsening abdominal pain, persistent vomiting, blood noted in stools, fever, or any other concerns. The patient verbalized understanding.     MDM   Final diagnoses:  Left lower quadrant pain  Nausea vomiting and diarrhea   Patient with abdominal pain in setting of negative CT scan. Labs are at her baseline/reassuring. Symptoms controlled in the emergency department. She has been drinking without vomiting. At this time, given lack of signs of infection, will discontinue anti-biotics as these may be causing her vomiting. Patient has a primary care physician appointment at 10 AM on Wednesday, August 24. She is encouraged to keep this. Return instructions discussed as above.   Renne Crigler, PA-C 08/30/14 1234  Gerhard Munch, MD 09/04/14 [phone]

## 2014-08-29 NOTE — Discharge Instructions (Signed)
Please read and follow all provided instructions.  Your diagnoses today include:  1. Left lower quadrant pain   2. Nausea vomiting and diarrhea    Tests performed today include:  Blood counts and electrolytes  Blood tests to check liver and kidney function  Blood tests to check pancreas function  Urine test to look for infection and pregnancy (in women)  CT scan - does not show diverticulitis or other serious problems  Vital signs. See below for your results today.   Medications prescribed:   None  Take any prescribed medications only as directed.  Home care instructions:   Follow any educational materials contained in this packet.  Follow-up instructions: Please follow-up with your primary care provider in the next 2 days for further evaluation of your symptoms.    Return instructions:  SEEK IMMEDIATE MEDICAL ATTENTION IF:  The pain does not go away or becomes severe   A temperature above 101F develops   Repeated vomiting occurs (multiple episodes)   The pain becomes localized to portions of the abdomen. The right side could possibly be appendicitis. In an adult, the left lower portion of the abdomen could be colitis or diverticulitis.   Blood is being passed in stools or vomit (bright red or black tarry stools)   You develop chest pain, difficulty breathing, dizziness or fainting, or become confused, poorly responsive, or inconsolable (young children)  If you have any other emergent concerns regarding your health  Additional Information: Abdominal (belly) pain can be caused by many things. Your caregiver performed an examination and possibly ordered blood/urine tests and imaging (CT scan, x-rays, ultrasound). Many cases can be observed and treated at home after initial evaluation in the emergency department. Even though you are being discharged home, abdominal pain can be unpredictable. Therefore, you need a repeated exam if your pain does not resolve, returns, or  worsens. Most patients with abdominal pain don't have to be admitted to the hospital or have surgery, but serious problems like appendicitis and gallbladder attacks can start out as nonspecific pain. Many abdominal conditions cannot be diagnosed in one visit, so follow-up evaluations are very important.  Your vital signs today were: BP 143/85 mmHg   Pulse 75   Temp(Src) 98.6 F (37 C) (Oral)   Resp 18   Ht  (1.6 m)   Wt 172 lb (78.019 kg)   BMI 30.48 kg/m2   SpO2 95% If your blood pressure (bp) was elevated above 135/85 this visit, please have this repeated by your doctor within one month. --------------

## 2014-08-29 NOTE — ED Notes (Signed)
Pt dx with diverticulitis on Aug 18. Reports abd pain and n/v/d continue. States she has been vomiting approx 45 mins after dose of antibiotics

## 2014-08-29 NOTE — ED Notes (Signed)
Patient transported to CT 

## 2014-08-29 NOTE — ED Notes (Signed)
C/o n/v and unable to tolerate meds she was given here last week

## 2014-08-29 NOTE — ED Notes (Signed)
D/c home with ride 

## 2014-08-31 ENCOUNTER — Encounter: Payer: Self-pay | Admitting: Family Medicine

## 2014-08-31 ENCOUNTER — Ambulatory Visit (INDEPENDENT_AMBULATORY_CARE_PROVIDER_SITE_OTHER): Payer: Medicare Other | Admitting: Family Medicine

## 2014-08-31 ENCOUNTER — Telehealth: Payer: Self-pay | Admitting: *Deleted

## 2014-08-31 VITALS — BP 175/132 | HR 88 | Temp 99.4°F | Resp 16 | Ht 62.0 in | Wt 179.0 lb

## 2014-08-31 DIAGNOSIS — R197 Diarrhea, unspecified: Secondary | ICD-10-CM

## 2014-08-31 DIAGNOSIS — K529 Noninfective gastroenteritis and colitis, unspecified: Secondary | ICD-10-CM

## 2014-08-31 DIAGNOSIS — I10 Essential (primary) hypertension: Secondary | ICD-10-CM | POA: Diagnosis not present

## 2014-08-31 DIAGNOSIS — R0789 Other chest pain: Secondary | ICD-10-CM | POA: Diagnosis not present

## 2014-08-31 DIAGNOSIS — R1032 Left lower quadrant pain: Secondary | ICD-10-CM | POA: Diagnosis not present

## 2014-08-31 LAB — COMPREHENSIVE METABOLIC PANEL
ALBUMIN: 4 g/dL (ref 3.5–5.2)
ALK PHOS: 73 U/L (ref 39–117)
ALT: 9 U/L (ref 0–35)
AST: 10 U/L (ref 0–37)
BUN: 11 mg/dL (ref 6–23)
CO2: 29 mEq/L (ref 19–32)
CREATININE: 0.77 mg/dL (ref 0.40–1.20)
Calcium: 9.4 mg/dL (ref 8.4–10.5)
Chloride: 105 mEq/L (ref 96–112)
GFR: 84.13 mL/min (ref >60.00)
Glucose, Bld: 94 mg/dL (ref 70–99)
Potassium: 4.9 mEq/L (ref 3.5–5.1)
SODIUM: 141 meq/L (ref 135–145)
TOTAL PROTEIN: 6.9 g/dL (ref 6.0–8.3)
Total Bilirubin: 0.3 mg/dL (ref 0.2–1.2)

## 2014-08-31 LAB — CBC WITH DIFFERENTIAL/PLATELET
BASOS ABS: 0.1 10*3/uL (ref 0.0–0.1)
Basophils Relative: 0.7 % (ref 0.0–3.0)
EOS ABS: 0.2 10*3/uL (ref 0.0–0.7)
EOS PCT: 1.9 % (ref 0.0–5.0)
HCT: 43.1 % (ref 36.0–46.0)
HEMOGLOBIN: 14.3 g/dL (ref 12.0–15.0)
Lymphocytes Relative: 34.3 % (ref 12.0–46.0)
Lymphs Abs: 3.6 10*3/uL (ref 0.7–4.0)
MCHC: 33.2 g/dL (ref 30.0–36.0)
MCV: 95.2 fl (ref 78.0–100.0)
MONO ABS: 0.6 10*3/uL (ref 0.1–1.0)
Monocytes Relative: 5.4 % (ref 3.0–12.0)
Neutro Abs: 6.1 10*3/uL (ref 1.4–7.7)
Neutrophils Relative %: 57.7 % (ref 43.0–77.0)
Platelets: 353 10*3/uL (ref 150.0–400.0)
RBC: 4.53 Mil/uL (ref 3.87–5.11)
RDW: 15.1 % (ref 11.5–15.5)
WBC: 10.6 10*3/uL — AB (ref 4.0–10.5)

## 2014-08-31 MED ORDER — CLONIDINE HCL 0.1 MG PO TABS: 0.1000 mg | ORAL_TABLET | Freq: Three times a day (TID) | ORAL | Status: DC

## 2014-08-31 MED ORDER — METRONIDAZOLE 500 MG PO TABS: 500.0000 mg | ORAL_TABLET | Freq: Three times a day (TID) | ORAL | Status: DC

## 2014-08-31 MED ORDER — VANCOMYCIN HCL 125 MG PO CAPS: 125.0000 mg | ORAL_CAPSULE | Freq: Four times a day (QID) | ORAL | Status: DC

## 2014-08-31 NOTE — Progress Notes (Signed)
Office Note 08/31/2014  CC:  Chief Complaint  Patient presents with  . Establish Care  . Hospitalization Follow-up    f/u ER visit    HPI:  Dana Morrow is a 49 y.o. White female who is here to establish care. Patient's most recent primary MD: Dr. Chestine Spore at [facility]. Old records in EPIC/HL EMR were reviewed prior to or during today's visit.  HPI:  Feels LLQ and L groin abd pain for the last 10d or so.  Constant, only goes away some when she takes the pain med for her dx of RSD x 2 hrs and then returns severe.  Severity 7/10 still. Was having diarrhea with this.  Nausea but no vomiting. Tm 100.  Went to [facility] ED on two occasions in the last week for these sx's, most recently 2 d/a.  First visit she was treated emipircally for diverticulitis, which she has a hx of.  Second visit they did the CT scan and it showed diverticulosis without 'itis, no explanation for her LLQ pain.  Still nauseous and having diarrhea (of note, abx started at ED caused vomiting so these were d/c'd--so she is currently on no abx).  Has 5-10 brownish colored, watery bm's per day over last 10d while ill.  No blood or pus that she has noted. No recent abx prior to this illness (6 mo or more).  No sick contacts.  No dysuria, hematuria, urinary urgency, or urinary frequency.  No vag d/c or bleeding.  Her abd pain feels the same-no worse--as it did in ED 2 d/a. Nausea is helped by her zofran.   She reports feeling mild pressure in central chest area but no radiation of the pain, no arm pain or jaw pain, no diaphoresis, no palpitations.  The pressure is what she feels normally when her bp is up, which is what happens when she is in a lot of pain like she is now.  No exertional CP.   Past Medical History  Diagnosis Date  . RSD (reflex sympathetic dystrophy)     a. L shoulder  . Migraine   . Colon polyps     a. s/p colonoscopy and polypectomy in 2003  . Abdominal pain, chronic, left lower quadrant     a. s/p Laproscopy in 2004 w/o finding of source of discomfort.  [name] GERD (gastroesophageal reflux disease)   . Hiatal hernia     a. small by EGD 2006.  [name] Visual disturbance     a. 12/2011 Left eye visual changes->w/u for TIA->Carotid U/S:  No ICA stenosis; Head CT: No acute abnl; MRI/MRA: mild RPCA stenosis; Echo: EF 55-60%, Triv AI, No PFO.  . Tobacco abuse     a. 50 pack year hx.  . Cholelithiasis     a. s/p cholecystectomy 10/2011  . Chest pain     a. reportedly nl MV and echo 10/2011 - [place]  . Diverticulitis   . Hypertension     Past Surgical History  Procedure Laterality Date  . Cesarean section  1990  . Shoulder surgery Left 1997 &98  . Cholecystectomy  2013  . Umbilical hernia repair  2014    with mesh  . Laparoscopy  2004  . Endometrial ablation  2011    Family History  Problem Relation Age of Onset  . Dementia Father   . Parkinson's disease Father   . Hypertension Father   . Hyperlipidemia Father   . Heart disease Father   . Heart attack  Mother     died @ 73  . Colon cancer Mother   . Hyperlipidemia Mother   . Heart disease Mother   . Hypertension Mother   . Esophageal cancer Brother   . Diabetes Neg Hx     Social History   Social History  . Marital Status: Divorced    Spouse Name: N/A  . Number of Children: N/A  . Years of Education: N/A   Occupational History  . Not on file.   Social History Main Topics  . Smoking status: Current Every Day Smoker -- 0.50 packs/day for 30 years    Types: Cigarettes  . Smokeless tobacco: Never Used     Comment: Smoked 2ppd for roughly 25 yrs but cut back to 1/2 ppd about 6 mos ago.  . Alcohol Use: No  . Drug Use: No  . Sexual Activity: Yes    Birth Control/ Protection: None   Other Topics Concern  . Not on file   Social History Narrative   Divorced, 1 son, 1 granddaughter.   Relocated to [place] from [place] in 04/2014 to live with her boyfriend.     Had been walking 3 miles on a treadmill  most days of the week until late October when she had cholecystectomy.   Tob 30 pack-yr hx.   Alc: none   No hx of substance abuse.    Outpatient Encounter Prescriptions as of 08/31/2014  Medication Sig  . HYDROmorphone (DILAUDID) 2 MG tablet Take by mouth 3 (three) times daily.  . metoprolol succinate (TOPROL-XL) 100 MG 24 hr tablet Take 100 mg by mouth daily. Take with or immediately following a meal.  . ondansetron (ZOFRAN ODT) 4 MG disintegrating tablet Take 1 tablet (4 mg total) by mouth every 8 (eight) hours as needed for nausea.  . cloNIDine (CATAPRES) 0.1 MG tablet Take 1 tablet (0.1 mg total) by mouth 3 (three) times daily.  . metroNIDAZOLE (FLAGYL) 500 MG tablet Take 1 tablet (500 mg total) by mouth 3 (three) times daily.  . vancomycin (VANCOCIN) 125 MG capsule Take 1 capsule (125 mg total) by mouth 4 (four) times daily.  . [DISCONTINUED] ciprofloxacin (CIPRO) 500 MG tablet Take 1 tablet (500 mg total) by mouth every 12 (twelve) hours. (Patient not taking: Reported on 08/31/2014)  . [DISCONTINUED] metroNIDAZOLE (FLAGYL) 500 MG tablet Take 1 tablet (500 mg total) by mouth 2 (two) times daily. (Patient not taking: Reported on 08/31/2014)  . [DISCONTINUED] ondansetron (ZOFRAN) 4 MG tablet Take 4 mg by mouth every 8 (eight) hours as needed for nausea or vomiting.   No facility-administered encounter medications on file as of 08/31/2014.    Allergies  Allergen Reactions  . Elavil [Amitriptyline]     hallucinations  . Klonopin [Clonazepam]     Hallucinations   . Levaquin [Levofloxacin Hemihydrate] Hives    ROS Review of Systems  Constitutional: Positive for appetite change and fatigue. Negative for fever and chills.  HENT: Negative for congestion, dental problem, ear pain and sore throat.   Eyes: Negative for discharge, redness and visual disturbance.  Respiratory: Negative for cough, chest tightness, shortness of breath and wheezing.   Cardiovascular: Negative for chest pain,  palpitations and leg swelling.  Gastrointestinal: Positive for nausea, abdominal pain and diarrhea. Negative for vomiting, constipation, blood in stool and anal bleeding.  Genitourinary: Negative for dysuria, urgency, frequency, hematuria, flank pain, vaginal bleeding and difficulty urinating.  Musculoskeletal: Negative for myalgias, back pain, joint swelling, arthralgias and neck stiffness.  Skin: Negative  for pallor and rash.  Neurological: Negative for dizziness, speech difficulty, weakness and headaches.  Hematological: Negative for adenopathy. Does not bruise/bleed easily.  Psychiatric/Behavioral: Negative for confusion and sleep disturbance. The patient is not nervous/anxious.     PE; Blood pressure 175/132, pulse 88, temperature 99.4 F (37.4 C), temperature source Oral, resp. rate 16, height  (1.575 m), weight 179 lb (81.194 kg), SpO2 97 %. Gen: Alert, well appearing.  Patient is oriented to person, place, time, and situation. AFFECT: pleasant, lucid thought and speech. ZOX:WRUE: no injection, icteris, swelling, or exudate.  EOMI, PERRLA. Mouth: lips without lesion/swelling.  Oral mucosa pink and moist. Oropharynx without erythema, exudate, or swelling.  Neck - No masses or thyromegaly or limitation in range of motion CV: RRR, no m/r/g.   LUNGS: CTA bilat, nonlabored resps, good aeration in all lung fields. ABD: soft, nondistended, without mass or bruit.  BS slightly hypoactive, with significant LLQ TTP and milder tenderness in midline suprapubic and umbilical areas and much milder in LUQ region.  No epigastric, RUQ, or RLQ tenderness.  No rebound tenderness.  She has some voluntary guarding when I attempt deep palpation in LLQ. EXT: no clubbing, cyanosis, or edema.  SKIN: no pallor or jaundice  Pertinent labs:  Lab Results  Component Value Date   WBC 13.4* 08/29/2014   HGB 14.5 08/29/2014   HCT 42.6 08/29/2014   MCV 93.4 08/29/2014   PLT 324 08/29/2014     Chemistry       Component Value Date/Time   NA 140 08/29/2014 1432   K 4.1 08/29/2014 1432   CL 105 08/29/2014 1432   CO2 24 08/29/2014 1432   BUN 17 08/29/2014 1432   CREATININE 0.88 08/29/2014 1432      Component Value Date/Time   CALCIUM 9.1 08/29/2014 1432   ALKPHOS 74 08/29/2014 1432   AST 14* 08/29/2014 1432   ALT 9* 08/29/2014 1432   BILITOT 0.3 08/29/2014 1432     Lab Results  Component Value Date   LIPASE 20* 08/25/2014   12 lead EKG today: NSR, no ischemic changes or ectopy.  ASSESSMENT AND PLAN:   New pt; obtain old PCP records.  1) Abdominal pain and diarrhea: no change in status since her contrast enhanced CT abd/pelv 2 d/a. This study showed only diverticulosis changes but no 'itis and no other explanation for her pain. I am worried about infectious colitis, specifically C diff, so I will try to get a stool sample to test for c diff toxin and bacterial culture. REcheck CBC and CMET today. Start trial of oral vancomycin 125 qid x 14 d since she said she vomited the flagyl and cipro that was rx'd recently.   Pt may have to re-try flagyl if cost/insurance coverage is an issue with the vancomycin. No antidiarrhea meds recommended at this time. Continue zofran q8h prn nausea.  2) HTN, poor control secondary to acute-on-chronic pain: start clonidine 0.1mg  tid and continue current Toprol XL 100 mg qd dosing.  Recheck in office in 2 days.  Return for f/u abd pain and diarrhea.

## 2014-08-31 NOTE — Progress Notes (Signed)
Pre visit review using our clinic review tool, if applicable. No additional management support is needed unless otherwise documented below in the visit note. 

## 2014-08-31 NOTE — Telephone Encounter (Signed)
FYI: Pt LMOM on 08/31/14 at 1:40pm stating that she tried the Flagyl again and it caused her to vomit. I spoke to pt and advised her that I have sent in PA for other medication, will take up to 3-5 business days to hear back from insurance. Pt voiced understanding.

## 2014-09-01 NOTE — Telephone Encounter (Signed)
Noted  

## 2014-09-02 ENCOUNTER — Ambulatory Visit: Payer: Medicare Other | Admitting: Family Medicine

## 2014-09-05 ENCOUNTER — Ambulatory Visit (INDEPENDENT_AMBULATORY_CARE_PROVIDER_SITE_OTHER): Payer: Medicare Other | Admitting: Family Medicine

## 2014-09-05 ENCOUNTER — Encounter: Payer: Self-pay | Admitting: Family Medicine

## 2014-09-05 VITALS — BP 144/100 | HR 93 | Temp 98.3°F | Resp 16 | Ht 62.0 in | Wt 178.0 lb

## 2014-09-05 DIAGNOSIS — R1032 Left lower quadrant pain: Principal | ICD-10-CM

## 2014-09-05 DIAGNOSIS — R1031 Right lower quadrant pain: Secondary | ICD-10-CM

## 2014-09-05 DIAGNOSIS — I10 Essential (primary) hypertension: Secondary | ICD-10-CM

## 2014-09-05 DIAGNOSIS — G8929 Other chronic pain: Secondary | ICD-10-CM | POA: Diagnosis not present

## 2014-09-05 MED ORDER — IRBESARTAN-HYDROCHLOROTHIAZIDE 150-12.5 MG PO TABS: 1.0000 | ORAL_TABLET | Freq: Every day | ORAL | Status: DC

## 2014-09-05 MED ORDER — GABAPENTIN 100 MG PO CAPS: ORAL_CAPSULE | ORAL | Status: DC

## 2014-09-05 NOTE — Progress Notes (Signed)
OFFICE VISIT  09/05/2014   CC:  Chief Complaint  Patient presents with  . Follow-up    abdominal pain and diarrhea    HPI:    Patient is a 49 y.o. Caucasian female who presents for 5 day f/u acute-on-chronic episodic L LQ abd pain, most recently WITH diarrhea-nonbloody. Stool collection kit: no sample collected b/c diarrhea resolved the day after I saw her last. Oral vancomycin for empiric tx of c diff colitis: not picked up due to cost plus diarrhea stopped.  Episodic LLQ pain for years, usually assoc with constipation and NOT diarrhea. Has never been treated with IBS meds.   Hx of chronic constipation, severity of which has waxed and waned over the years, was on senakot daily at one time, also miralax prn.  BP check outside office x 2 : both similar to today's reading (stage II HTN). Feels fatigued since getting on clinidine, not sure if related.   Past Medical History  Diagnosis Date  . RSD (reflex sympathetic dystrophy)     a. L shoulder--pain mgmt with [facility] in [place] and/or w/s.  . Migraine   . Colon polyps     a. s/p colonoscopy and polypectomy in 2003  . Abdominal pain, chronic, left lower quadrant     a. s/p Laproscopy in 2004 w/o finding of source of discomfort.  [name] GERD (gastroesophageal reflux disease)   . Hiatal hernia     a. small by EGD 2006.  [name] Visual disturbance     a. 12/2011 Left eye visual changes->w/u for TIA->Carotid U/S:  No ICA stenosis; Head CT: No acute abnl; MRI/MRA: mild RPCA stenosis; Echo: EF 55-60%, Triv AI, No PFO.  . Tobacco abuse     a. 50 pack year hx.  . Cholelithiasis     a. s/p cholecystectomy 10/2011  . Chest pain     a. reportedly nl MV and echo 10/2011 - [place]  . Diverticulitis   . Hypertension     Past Surgical History  Procedure Laterality Date  . Cesarean section  1990  . Shoulder surgery Left 1997 &98  . Cholecystectomy  2013  . Umbilical hernia repair  2014    with mesh  . Laparoscopy   05/2002    Dr. Lenda Kelp cause for LLQ abd pain found  . Endometrial ablation  2011  . Colonoscopy  10/2001    Dr. Watt Climes; diverticulosis, int/ext hem, one small polyp removed.  . Esophagogastroduodenoscopy  02/2004    Dr. Watt Climes; normal    Outpatient Prescriptions Prior to Visit  Medication Sig Dispense Refill  . cloNIDine (CATAPRES) 0.1 MG tablet Take 1 tablet (0.1 mg total) by mouth 3 (three) times daily. 90 tablet 3  . HYDROmorphone (DILAUDID) 2 MG tablet Take by mouth 3 (three) times daily.    . metoprolol succinate (TOPROL-XL) 100 MG 24 hr tablet Take 100 mg by mouth daily. Take with or immediately following a meal.    . ondansetron (ZOFRAN ODT) 4 MG disintegrating tablet Take 1 tablet (4 mg total) by mouth every 8 (eight) hours as needed for nausea. 10 tablet 0  . metroNIDAZOLE (FLAGYL) 500 MG tablet Take 1 tablet (500 mg total) by mouth 3 (three) times daily. (Patient not taking: Reported on 09/05/2014) 42 tablet 0  . vancomycin (VANCOCIN) 125 MG capsule Take 1 capsule (125 mg total) by mouth 4 (four) times daily. (Patient not taking: Reported on 09/05/2014) 56 capsule 0   No facility-administered medications prior to visit.  Allergies  Allergen Reactions  . Flagyl [Metronidazole] Nausea And Vomiting    Oral form only, IV pt can tolerate  . Elavil [Amitriptyline]     hallucinations  . Klonopin [Clonazepam]     Hallucinations   . Levaquin [Levofloxacin Hemihydrate] Hives    ROS As per HPI  PE: Blood pressure 144/100, pulse 93, temperature 98.3 F (36.8 C), temperature source Oral, resp. rate 16, height 5' 2"  (1.575 m), weight 178 lb (80.74 kg), SpO2 100 %.Repeat bp manual cuff R arm 144/100. Gen: Alert, well appearing.  Patient is oriented to person, place, time, and situation. CV: RRR, no m/r/g.   LUNGS: CTA bilat, nonlabored resps, good aeration in all lung fields. ABD: soft, nondistended.  BS normal. TTP in Left abdomen, particularly LLQ, no rebound.   Tenderness is worse with me palpating abd wall while she does an abdominal crunch.   LABS:  No results found for: TSH Lab Results  Component Value Date   WBC 10.6* 08/31/2014   HGB 14.3 08/31/2014   HCT 43.1 08/31/2014   MCV 95.2 08/31/2014   PLT 353.0 08/31/2014   Lab Results  Component Value Date   CREATININE 0.77 08/31/2014   BUN 11 08/31/2014   NA 141 08/31/2014   K 4.9 08/31/2014   CL 105 08/31/2014   CO2 29 08/31/2014   Lab Results  Component Value Date   ALT 9 08/31/2014   AST 10 08/31/2014   ALKPHOS 73 08/31/2014   BILITOT 0.3 08/31/2014   Lab Results  Component Value Date   CHOL 228* 12/18/2011   Lab Results  Component Value Date   HDL 40 12/18/2011   Lab Results  Component Value Date   LDLCALC 150* 12/18/2011   Lab Results  Component Value Date   TRIG 190* 12/18/2011   Lab Results  Component Value Date   CHOLHDL 5.7 12/18/2011   IMPRESSION AND PLAN:  1) Uncontrolled HTN: continue current meds and add irbesartan/hctz 150/12.5. Try to monitor bp out of office as before.  2) Chronic LLQ abd pain: some component of this is abdominal wall pain. Will start trial of gabapentin 117m tid and titrate: 2049mtid after 1 week, then 300 mg tid after 2 weeks. Therapeutic expectations and side effect profile of medication discussed today.  Patient's questions answered. GI referral to Dr. MaWatt Climesor consideration of colonoscopy.  She has seen him in the remote past. OK to NOT take oral vanco as initially planned since her diarrhea has resolved and no stool sample was ever obtained.  An After Visit Summary was printed and given to the patient.  FOLLOW UP: Return in about 1 week (around 09/12/2014) for f/u HTN and abd wall pain.

## 2014-09-05 NOTE — Progress Notes (Signed)
Pre visit review using our clinic review tool, if applicable. No additional management support is needed unless otherwise documented below in the visit note. 

## 2014-09-06 ENCOUNTER — Emergency Department (HOSPITAL_BASED_OUTPATIENT_CLINIC_OR_DEPARTMENT_OTHER): Payer: Medicare Other

## 2014-09-06 ENCOUNTER — Emergency Department (HOSPITAL_BASED_OUTPATIENT_CLINIC_OR_DEPARTMENT_OTHER)
Admission: EM | Admit: 2014-09-06 | Discharge: 2014-09-06 | Disposition: A | Discharge status: Home / Self Care | Payer: Medicare Other | Attending: Emergency Medicine | Admitting: Emergency Medicine

## 2014-09-06 ENCOUNTER — Encounter: Payer: Self-pay | Admitting: Family Medicine

## 2014-09-06 ENCOUNTER — Encounter (HOSPITAL_BASED_OUTPATIENT_CLINIC_OR_DEPARTMENT_OTHER): Payer: Self-pay | Admitting: *Deleted

## 2014-09-06 DIAGNOSIS — Z79899 Other long term (current) drug therapy: Secondary | ICD-10-CM | POA: Insufficient documentation

## 2014-09-06 DIAGNOSIS — R1032 Left lower quadrant pain: Secondary | ICD-10-CM | POA: Insufficient documentation

## 2014-09-06 DIAGNOSIS — G8929 Other chronic pain: Secondary | ICD-10-CM | POA: Diagnosis not present

## 2014-09-06 DIAGNOSIS — Z9049 Acquired absence of other specified parts of digestive tract: Secondary | ICD-10-CM | POA: Diagnosis not present

## 2014-09-06 DIAGNOSIS — Z8719 Personal history of other diseases of the digestive system: Secondary | ICD-10-CM | POA: Insufficient documentation

## 2014-09-06 DIAGNOSIS — Z720 Tobacco use: Secondary | ICD-10-CM | POA: Diagnosis not present

## 2014-09-06 DIAGNOSIS — K5730 Diverticulosis of large intestine without perforation or abscess without bleeding: Secondary | ICD-10-CM | POA: Diagnosis not present

## 2014-09-06 DIAGNOSIS — Z86010 Personal history of colonic polyps: Secondary | ICD-10-CM | POA: Diagnosis not present

## 2014-09-06 DIAGNOSIS — G43909 Migraine, unspecified, not intractable, without status migrainosus: Secondary | ICD-10-CM | POA: Insufficient documentation

## 2014-09-06 DIAGNOSIS — I10 Essential (primary) hypertension: Secondary | ICD-10-CM | POA: Diagnosis not present

## 2014-09-06 LAB — CBC WITH DIFFERENTIAL/PLATELET
BASOS ABS: 0.1 10*3/uL (ref 0.0–0.1)
BASOS PCT: 1 % (ref 0–1)
Eosinophils Absolute: 0.1 10*3/uL (ref 0.0–0.7)
Eosinophils Relative: 1 % (ref 0–5)
HEMATOCRIT: 42.7 % (ref 36.0–46.0)
HEMOGLOBIN: 14.6 g/dL (ref 12.0–15.0)
LYMPHS PCT: 27 % (ref 12–46)
Lymphs Abs: 3.3 10*3/uL (ref 0.7–4.0)
MCH: 31.5 pg (ref 26.0–34.0)
MCHC: 34.2 g/dL (ref 30.0–36.0)
MCV: 92 fL (ref 78.0–100.0)
MONOS PCT: 7 % (ref 3–12)
Monocytes Absolute: 0.9 10*3/uL (ref 0.1–1.0)
NEUTROS ABS: 7.9 10*3/uL — AB (ref 1.7–7.7)
NEUTROS PCT: 64 % (ref 43–77)
Platelets: 251 10*3/uL (ref 150–400)
RBC: 4.64 MIL/uL (ref 3.87–5.11)
RDW: 14.3 % (ref 11.5–15.5)
WBC: 12.3 10*3/uL — ABNORMAL HIGH (ref 4.0–10.5)

## 2014-09-06 LAB — COMPREHENSIVE METABOLIC PANEL
ALBUMIN: 3.9 g/dL (ref 3.5–5.0)
ALK PHOS: 71 U/L (ref 38–126)
ALT: 10 U/L — ABNORMAL LOW (ref 14–54)
ANION GAP: 8 (ref 5–15)
AST: 12 U/L — ABNORMAL LOW (ref 15–41)
BUN: 14 mg/dL (ref 6–20)
CALCIUM: 9.1 mg/dL (ref 8.9–10.3)
CO2: 25 mmol/L (ref 22–32)
Chloride: 106 mmol/L (ref 101–111)
Creatinine, Ser: 0.73 mg/dL (ref 0.44–1.00)
GFR calc non Af Amer: >60 mL/min (ref >60)
Glucose, Bld: 90 mg/dL (ref 65–99)
POTASSIUM: 3.9 mmol/L (ref 3.5–5.1)
SODIUM: 139 mmol/L (ref 135–145)
Total Bilirubin: 0.5 mg/dL (ref 0.3–1.2)
Total Protein: 7.1 g/dL (ref 6.5–8.1)

## 2014-09-06 LAB — URINALYSIS, ROUTINE W REFLEX MICROSCOPIC
Bilirubin Urine: NEGATIVE
Glucose, UA: NEGATIVE mg/dL
Hgb urine dipstick: NEGATIVE
Ketones, ur: NEGATIVE mg/dL
Leukocytes, UA: NEGATIVE
NITRITE: NEGATIVE
PH: 7 (ref 5.0–8.0)
Protein, ur: NEGATIVE mg/dL
SPECIFIC GRAVITY, URINE: 1.025 (ref 1.005–1.030)
UROBILINOGEN UA: 0.2 mg/dL (ref 0.0–1.0)

## 2014-09-06 MED ORDER — IOHEXOL 300 MG/ML  SOLN
25.0000 mL | Freq: Once | INTRAMUSCULAR | Status: AC | PRN
  Administered 2014-09-06: 25 mL via ORAL

## 2014-09-06 MED ORDER — ONDANSETRON HCL 4 MG/2ML IJ SOLN
4.0000 mg | Freq: Once | INTRAMUSCULAR | Status: AC
  Administered 2014-09-06: 4 mg via INTRAVENOUS
  Filled 2014-09-06: qty 2

## 2014-09-06 MED ORDER — IOHEXOL 300 MG/ML  SOLN
100.0000 mL | Freq: Once | INTRAMUSCULAR | Status: AC | PRN
  Administered 2014-09-06: 100 mL via INTRAVENOUS

## 2014-09-06 MED ORDER — HYDROMORPHONE HCL 1 MG/ML IJ SOLN
1.0000 mg | Freq: Once | INTRAMUSCULAR | Status: AC
  Administered 2014-09-06: 1 mg via INTRAVENOUS
  Filled 2014-09-06: qty 1

## 2014-09-06 MED ORDER — MORPHINE SULFATE (PF) 4 MG/ML IV SOLN
4.0000 mg | Freq: Once | INTRAVENOUS | Status: AC
  Administered 2014-09-06: 4 mg via INTRAVENOUS
  Filled 2014-09-06: qty 1

## 2014-09-06 MED ORDER — SODIUM CHLORIDE 0.9 % IV BOLUS (SEPSIS)
1000.0000 mL | Freq: Once | INTRAVENOUS | Status: AC
  Administered 2014-09-06: 1000 mL via INTRAVENOUS

## 2014-09-06 NOTE — ED Provider Notes (Signed)
CSN: [id number]     Arrival date & time 09/06/14  1024 History   First MD Initiated Contact with Patient 09/06/14 1110     Chief Complaint  Patient presents with  . Abdominal Pain     (Consider location/radiation/quality/duration/timing/severity/associated sxs/prior Treatment) HPI Comments: Patient is a 49 year old female with history of hypertension, diverticulosis. She presents with complaints of left lower quadrant pain that has been occurring intermittently for the past 2 weeks. She has been evaluated here on 2 prior occasions and seen her primary care doctor 3 times for the same complaint. She was initially started on Cipro and Flagyl, however these caused her nausea and vomiting and was unable to take them. Her primary doctor had planned to change her to oral vancomycin, however this has not been approved by her insurance company. She returns today with ongoing pain in her left lower quadrant. She denies to me she is having any vomiting or diarrhea. She denies any bloody stools. She did have a CT scan performed here which showed diverticulosis, however no diverticulitis.  Patient is a 49 y.o. female presenting with abdominal pain. The history is provided by the patient.  Abdominal Pain Pain location:  LLQ Pain quality: cramping   Pain radiates to:  Does not radiate Pain severity:  Severe Duration:  2 weeks Timing:  Intermittent Progression:  Worsening Chronicity:  New Relieved by:  Nothing Worsened by:  Nothing tried Ineffective treatments:  None tried   Past Medical History  Diagnosis Date  . RSD (reflex sympathetic dystrophy)     a. L shoulder--pain mgmt with [facility] in [place] and/or w/s.  . Migraine   . Colon polyps     a. s/p colonoscopy and polypectomy in 2003  . Abdominal pain, chronic, left lower quadrant     a. s/p Laproscopy in 2004 w/o finding of source of discomfort.  [name] GERD (gastroesophageal reflux disease)   . Hiatal hernia     a. small  by EGD 2006.  [name] Visual disturbance     a. 12/2011 Left eye visual changes->w/u for TIA->Carotid U/S:  No ICA stenosis; Head CT: No acute abnl; MRI/MRA: mild RPCA stenosis; Echo: EF 55-60%, Triv AI, No PFO.  . Tobacco abuse     a. 50 pack year hx.  . Cholelithiasis     a. s/p cholecystectomy 10/2011  . Chest pain     a. reportedly nl MV and echo 10/2011 - [place]  . Diverticulitis   . Hypertension    Past Surgical History  Procedure Laterality Date  . Cesarean section  1990  . Shoulder surgery Left 1997 &98  . Cholecystectomy  2013  . Umbilical hernia repair  2014    with mesh  . Laparoscopy  05/2002    Dr. Harmon Dun cause for LLQ abd pain found  . Endometrial ablation  2011  . Colonoscopy  10/2001    Dr. Ewing Schlein; diverticulosis, int/ext hem, one small polyp removed.  . Esophagogastroduodenoscopy  02/2004    Dr. Ewing Schlein; normal   Family History  Problem Relation Age of Onset  . Dementia Father   . Parkinson's disease Father   . Hypertension Father   . Hyperlipidemia Father   . Heart disease Father   . Heart attack Mother     died @ 88  . Colon cancer Mother   . Hyperlipidemia Mother   . Heart disease Mother   . Hypertension Mother   . Esophageal cancer Brother   . Diabetes Neg Hx  Social History  Substance Use Topics  . Smoking status: Current Every Day Smoker -- 0.50 packs/day for 30 years    Types: Cigarettes  . Smokeless tobacco: Never Used     Comment: Smoked 2ppd for roughly 25 yrs but cut back to 1/2 ppd about 6 mos ago.  . Alcohol Use: No   OB History    No data available     Review of Systems  Gastrointestinal: Positive for abdominal pain.  All other systems reviewed and are negative.     Allergies  Flagyl; Elavil; Klonopin; and Levaquin  Home Medications   Prior to Admission medications   Medication Sig Start Date End Date Taking? Authorizing Provider  cloNIDine (CATAPRES) 0.1 MG tablet Take 1 tablet (0.1 mg total) by mouth 3  (three) times daily. 08/31/14  Yes Jeoffrey Massed, MD  gabapentin (NEURONTIN) 100 MG capsule 1 tab po tid x 7d, then 2 tabs po tid, then 3 tabs po tid 09/05/14  Yes Jeoffrey Massed, MD  HYDROmorphone (DILAUDID) 2 MG tablet Take by mouth 3 (three) times daily.   Yes Historical Provider, MD  irbesartan-hydrochlorothiazide (AVALIDE) 150-12.5 MG per tablet Take 1 tablet by mouth daily. 09/05/14  Yes Jeoffrey Massed, MD  metoprolol succinate (TOPROL-XL) 100 MG 24 hr tablet Take 100 mg by mouth daily. Take with or immediately following a meal.   Yes Historical Provider, MD  ondansetron (ZOFRAN ODT) 4 MG disintegrating tablet Take 1 tablet (4 mg total) by mouth every 8 (eight) hours as needed for nausea. 08/25/14  Yes Rolland Porter, MD   BP 185/110 mmHg  Pulse 102  Temp(Src) 99.2 F (37.3 C) (Oral)  Resp 16  Ht  (1.575 m)  Wt 172 lb (78.019 kg)  BMI 31.45 kg/m2  SpO2 100% Physical Exam  Constitutional: She is oriented to person, place, and time. She appears well-developed and well-nourished. No distress.  HENT:  Head: Normocephalic and atraumatic.  Neck: Normal range of motion. Neck supple.  Cardiovascular: Normal rate and regular rhythm.  Exam reveals no gallop and no friction rub.   No murmur heard. Pulmonary/Chest: Effort normal and breath sounds normal. No respiratory distress. She has no wheezes.  Abdominal: Soft. Bowel sounds are normal. She exhibits no distension. There is tenderness. There is no rebound and no guarding.  There is tenderness to palpation in the left lower quadrant.  Musculoskeletal: Normal range of motion.  Neurological: She is alert and oriented to person, place, and time.  Skin: Skin is warm and dry. She is not diaphoretic.  Nursing note and vitals reviewed.   ED Course  Procedures (including critical care time) Labs Review Labs Reviewed - No data to display  Imaging Review No results found. I have personally reviewed and evaluated these images and lab  results as part of my medical decision-making.   EKG Interpretation None      MDM   Final diagnoses:  None    CT scan reveals no acute intra-abdominal process. The patient appears to be feeling better after medications and fluids. I believe she is appropriate for discharge. She has a upcoming follow-up appointment with gastroenterology which I have advised her to keep.    Geoffery Lyons, MD 09/06/14 1409

## 2014-09-06 NOTE — ED Notes (Signed)
Patient transported to & from xray via stretcher. 

## 2014-09-06 NOTE — ED Notes (Addendum)
C/o n/v/d x 1 week has seen here 2 times and at MD x 3 for same. C/o left abd pain and down into groin area. Had abd mesh placed x 2 years ago and MD thinks that there is a problem with that.

## 2014-09-06 NOTE — Discharge Instructions (Signed)
Continue your pain medication as previously prescribed.  Follow-up with gastroenterology as previously arranged.   Abdominal Pain Many things can cause abdominal pain. Usually, abdominal pain is not caused by a disease and will improve without treatment. It can often be observed and treated at home. Your health care provider will do a physical exam and possibly order blood tests and X-rays to help determine the seriousness of your pain. However, in many cases, more time must pass before a clear cause of the pain can be found. Before that point, your health care provider may not know if you need more testing or further treatment. HOME CARE INSTRUCTIONS  Monitor your abdominal pain for any changes. The following actions may help to alleviate any discomfort you are experiencing:  Only take over-the-counter or prescription medicines as directed by your health care provider.  Do not take laxatives unless directed to do so by your health care provider.  Try a clear liquid diet (broth, tea, or water) as directed by your health care provider. Slowly move to a bland diet as tolerated. SEEK MEDICAL CARE IF:  You have unexplained abdominal pain.  You have abdominal pain associated with nausea or diarrhea.  You have pain when you urinate or have a bowel movement.  You experience abdominal pain that wakes you in the night.  You have abdominal pain that is worsened or improved by eating food.  You have abdominal pain that is worsened with eating fatty foods.  You have a fever. SEEK IMMEDIATE MEDICAL CARE IF:   Your pain does not go away within 2 hours.  You keep throwing up (vomiting).  Your pain is felt only in portions of the abdomen, such as the right side or the left lower portion of the abdomen.  You pass bloody or black tarry stools. MAKE SURE YOU:  Understand these instructions.   Will watch your condition.   Will get help right away if you are not doing well or get worse.   Document Released: 10/03/2004 Document Revised: 12/29/2012 Document Reviewed: 09/02/2012 [facility] Patient Information 2015 [place], [place]. This information is not intended to replace advice given to you by your health care provider. Make sure you discuss any questions you have with your health care provider.

## 2014-09-06 NOTE — ED Notes (Signed)
IV insertion attempted with no success. Pt with L arm restriction, will assess with [place].

## 2014-09-13 ENCOUNTER — Encounter: Payer: Medicare Other | Admitting: Family Medicine

## 2014-09-13 NOTE — Progress Notes (Signed)
OFFICE VISIT  09/13/2014   CC: No chief complaint on file.    HPI:    Patient is a 49 y.o. Caucasian female who presents for 1 week f/u chronic, episodic LLQ abd pain.  Extensive search for cause of her pain over the last decade has been unrevealing (question of diverticulitis clinically but no CT evidence documented).  I started empiric tx for abd wall/possible neuropathic type pain last visit--low dose neurontin.  Past Medical History  Diagnosis Date   RSD (reflex sympathetic dystrophy)     a. L shoulder--pain mgmt with [facility] in [place] and/or w/s.   Migraine    Colon polyps     a. s/p colonoscopy and polypectomy in 2003   Abdominal pain, chronic, left lower quadrant     a. s/p Laproscopy in 2004 w/o finding of source of discomfort.   GERD (gastroesophageal reflux disease)    Hiatal hernia     a. small by EGD 2006.   Visual disturbance     a. 12/2011 Left eye visual changes->w/u for TIA->Carotid U/S:  No ICA stenosis; Head CT: No acute abnl; MRI/MRA: mild RPCA stenosis; Echo: EF 55-60%, Triv AI, No PFO.   Tobacco abuse     a. 50 pack year hx.   Cholelithiasis     a. s/p cholecystectomy 10/2011   Chest pain     a. reportedly nl MV and echo 10/2011 - [place]   Diverticulitis    Hypertension     Past Surgical History  Procedure Laterality Date   Cesarean section  1990   Shoulder surgery Left 1997 &98   Cholecystectomy  2013   Umbilical hernia repair  2014    with mesh   Laparoscopy  05/2002    Dr. Harmon Dun cause for LLQ abd pain found   Endometrial ablation  2011   Colonoscopy  10/2001    Dr. Ewing Schlein; diverticulosis, int/ext hem, one small polyp removed.   Esophagogastroduodenoscopy  02/2004    Dr. Ewing Schlein; normal    Outpatient Prescriptions Prior to Visit  Medication Sig Dispense Refill   cloNIDine (CATAPRES) 0.1 MG tablet Take 1 tablet (0.1 mg total) by mouth 3 (three) times daily. 90 tablet 3   gabapentin (NEURONTIN) 100 MG capsule  1 tab po tid x 7d, then 2 tabs po tid, then 3 tabs po tid 90 capsule 0   HYDROmorphone (DILAUDID) 2 MG tablet Take by mouth 3 (three) times daily.     irbesartan-hydrochlorothiazide (AVALIDE) 150-12.5 MG per tablet Take 1 tablet by mouth daily. 30 tablet 0   metoprolol succinate (TOPROL-XL) 100 MG 24 hr tablet Take 100 mg by mouth daily. Take with or immediately following a meal.     ondansetron (ZOFRAN ODT) 4 MG disintegrating tablet Take 1 tablet (4 mg total) by mouth every 8 (eight) hours as needed for nausea. 10 tablet 0   No facility-administered medications prior to visit.    Allergies  Allergen Reactions   Flagyl [Metronidazole] Nausea And Vomiting    Oral form only, IV pt can tolerate   Elavil [Amitriptyline]     hallucinations   Klonopin [Clonazepam]     Hallucinations    Levaquin [Levofloxacin Hemihydrate] Hives    ROS As per HPI  PE: There were no vitals taken for this visit.   LABS:   Lab Results  Component Value Date   WBC 12.3* 09/06/2014   HGB 14.6 09/06/2014   HCT 42.7 09/06/2014   MCV 92.0 09/06/2014   PLT  251 09/06/2014   Lab Results  Component Value Date   CREATININE 0.73 09/06/2014   BUN 14 09/06/2014   NA 139 09/06/2014   K 3.9 09/06/2014   CL 106 09/06/2014   CO2 25 09/06/2014   Lab Results  Component Value Date   ALT 10* 09/06/2014   AST 12* 09/06/2014   ALKPHOS 71 09/06/2014   BILITOT 0.5 09/06/2014    IMPRESSION AND PLAN:  No problem-specific assessment & plan notes found for this encounter.   FOLLOW UP: No Follow-up on file.

## 2014-10-03 ENCOUNTER — Encounter (HOSPITAL_BASED_OUTPATIENT_CLINIC_OR_DEPARTMENT_OTHER): Payer: Self-pay | Admitting: *Deleted

## 2014-10-03 ENCOUNTER — Emergency Department (HOSPITAL_BASED_OUTPATIENT_CLINIC_OR_DEPARTMENT_OTHER)
Admission: EM | Admit: 2014-10-03 | Discharge: 2014-10-03 | Disposition: A | Discharge status: Home / Self Care | Payer: Medicare Other | Attending: Emergency Medicine | Admitting: Emergency Medicine

## 2014-10-03 DIAGNOSIS — G8929 Other chronic pain: Secondary | ICD-10-CM | POA: Diagnosis not present

## 2014-10-03 DIAGNOSIS — Z86010 Personal history of colonic polyps: Secondary | ICD-10-CM | POA: Insufficient documentation

## 2014-10-03 DIAGNOSIS — Z8719 Personal history of other diseases of the digestive system: Secondary | ICD-10-CM | POA: Insufficient documentation

## 2014-10-03 DIAGNOSIS — G43909 Migraine, unspecified, not intractable, without status migrainosus: Secondary | ICD-10-CM | POA: Diagnosis not present

## 2014-10-03 DIAGNOSIS — Z79899 Other long term (current) drug therapy: Secondary | ICD-10-CM | POA: Diagnosis not present

## 2014-10-03 DIAGNOSIS — R1032 Left lower quadrant pain: Secondary | ICD-10-CM | POA: Diagnosis not present

## 2014-10-03 DIAGNOSIS — Z720 Tobacco use: Secondary | ICD-10-CM | POA: Diagnosis not present

## 2014-10-03 DIAGNOSIS — I10 Essential (primary) hypertension: Secondary | ICD-10-CM | POA: Insufficient documentation

## 2014-10-03 LAB — COMPREHENSIVE METABOLIC PANEL
ALT: 11 U/L — ABNORMAL LOW (ref 14–54)
ANION GAP: 6 (ref 5–15)
AST: 14 U/L — AB (ref 15–41)
Albumin: 4.2 g/dL (ref 3.5–5.0)
Alkaline Phosphatase: 73 U/L (ref 38–126)
BUN: 12 mg/dL (ref 6–20)
CHLORIDE: 106 mmol/L (ref 101–111)
CO2: 28 mmol/L (ref 22–32)
Calcium: 9.4 mg/dL (ref 8.9–10.3)
Creatinine, Ser: 0.75 mg/dL (ref 0.44–1.00)
GFR calc Af Amer: >60 mL/min (ref >60)
GLUCOSE: 91 mg/dL (ref 65–99)
POTASSIUM: 4.7 mmol/L (ref 3.5–5.1)
Sodium: 140 mmol/L (ref 135–145)
TOTAL PROTEIN: 7.8 g/dL (ref 6.5–8.1)
Total Bilirubin: 0.5 mg/dL (ref 0.3–1.2)

## 2014-10-03 LAB — CBC WITH DIFFERENTIAL/PLATELET
BASOS PCT: 1 %
Basophils Absolute: 0.1 10*3/uL (ref 0.0–0.1)
EOS ABS: 0.2 10*3/uL (ref 0.0–0.7)
EOS PCT: 2 %
HCT: 44 % (ref 36.0–46.0)
HEMOGLOBIN: 15.1 g/dL — AB (ref 12.0–15.0)
Lymphocytes Relative: 36 %
Lymphs Abs: 3.9 10*3/uL (ref 0.7–4.0)
MCH: 31.9 pg (ref 26.0–34.0)
MCHC: 34.3 g/dL (ref 30.0–36.0)
MCV: 93 fL (ref 78.0–100.0)
Monocytes Absolute: 0.8 10*3/uL (ref 0.1–1.0)
Monocytes Relative: 7 %
NEUTROS PCT: 54 %
Neutro Abs: 5.9 10*3/uL (ref 1.7–7.7)
PLATELETS: 275 10*3/uL (ref 150–400)
RBC: 4.73 MIL/uL (ref 3.87–5.11)
RDW: 13.5 % (ref 11.5–15.5)
WBC: 10.8 10*3/uL — AB (ref 4.0–10.5)

## 2014-10-03 LAB — URINALYSIS, ROUTINE W REFLEX MICROSCOPIC
BILIRUBIN URINE: NEGATIVE
Glucose, UA: NEGATIVE mg/dL
HGB URINE DIPSTICK: NEGATIVE
KETONES UR: NEGATIVE mg/dL
Leukocytes, UA: NEGATIVE
NITRITE: NEGATIVE
PROTEIN: NEGATIVE mg/dL
Specific Gravity, Urine: 1.005 (ref 1.005–1.030)
UROBILINOGEN UA: 0.2 mg/dL (ref 0.0–1.0)
pH: 6.5 (ref 5.0–8.0)

## 2014-10-03 LAB — LIPASE, BLOOD: LIPASE: 61 U/L — AB (ref 22–51)

## 2014-10-03 LAB — I-STAT CG4 LACTIC ACID, ED: LACTIC ACID, VENOUS: 0.62 mmol/L (ref 0.5–2.0)

## 2014-10-03 NOTE — ED Notes (Signed)
States she has been evaluated several times for abdominal pain. She has been referred to a GI specialist but can't see him October 5th.

## 2014-10-03 NOTE — Discharge Instructions (Signed)

## 2014-10-03 NOTE — ED Notes (Signed)
Pt reports increased frequency in the past couple of days. Sts that she has an appointment with GI on 10/12/14, but the pain continues to get worse.

## 2014-10-03 NOTE — ED Provider Notes (Signed)
CSN: [id number]     Arrival date & time 10/03/14  1149 History   First MD Initiated Contact with Patient 10/03/14 1306     Chief Complaint  Patient presents with  . Abdominal Pain     (Consider location/radiation/quality/duration/timing/severity/associated sxs/prior Treatment) Patient is a 49 y.o. female presenting with abdominal pain. The history is provided by the patient.  Abdominal Pain Pain location:  LLQ Pain quality: aching   Pain radiates to:  Does not radiate Pain severity:  Moderate Onset quality:  Gradual Duration:  4 weeks Timing:  Constant Progression:  Waxing and waning Chronicity:  Chronic Context: previous surgery (mesh)   Context: not alcohol use and not trauma   Relieved by:  Nothing Worsened by:  Nothing tried Ineffective treatments:  None tried Associated symptoms: no anorexia     Past Medical History  Diagnosis Date  . RSD (reflex sympathetic dystrophy)     a. L shoulder--pain mgmt with [facility] in [place] and/or w/s.  . Migraine   . Colon polyps     a. s/p colonoscopy and polypectomy in 2003  . Abdominal pain, chronic, left lower quadrant     a. s/p Laproscopy in 2004 w/o finding of source of discomfort.  [name] GERD (gastroesophageal reflux disease)   . Hiatal hernia     a. small by EGD 2006.  [name] Visual disturbance     a. 12/2011 Left eye visual changes->w/u for TIA->Carotid U/S:  No ICA stenosis; Head CT: No acute abnl; MRI/MRA: mild RPCA stenosis; Echo: EF 55-60%, Triv AI, No PFO.  . Tobacco abuse     a. 50 pack year hx.  . Cholelithiasis     a. s/p cholecystectomy 10/2011  . Chest pain     a. reportedly nl MV and echo 10/2011 - [place]  . Diverticulitis   . Hypertension    Past Surgical History  Procedure Laterality Date  . Cesarean section  1990  . Shoulder surgery Left 1997 &98  . Cholecystectomy  2013  . Umbilical hernia repair  2014    with mesh  . Laparoscopy  05/2002    Dr. Harmon Dun cause for LLQ abd  pain found  . Endometrial ablation  2011  . Colonoscopy  10/2001    Dr. Ewing Schlein; diverticulosis, int/ext hem, one small polyp removed.  . Esophagogastroduodenoscopy  02/2004    Dr. Ewing Schlein; normal   Family History  Problem Relation Age of Onset  . Dementia Father   . Parkinson's disease Father   . Hypertension Father   . Hyperlipidemia Father   . Heart disease Father   . Heart attack Mother     died @ 36  . Colon cancer Mother   . Hyperlipidemia Mother   . Heart disease Mother   . Hypertension Mother   . Esophageal cancer Brother   . Diabetes Neg Hx    Social History  Substance Use Topics  . Smoking status: Current Every Day Smoker -- 0.50 packs/day for 30 years    Types: Cigarettes  . Smokeless tobacco: Never Used     Comment: Smoked 2ppd for roughly 25 yrs but cut back to 1/2 ppd about 6 mos ago.  . Alcohol Use: No   OB History    No data available     Review of Systems  Gastrointestinal: Positive for abdominal pain. Negative for anorexia.  All other systems reviewed and are negative.     Allergies  Flagyl; Elavil; Klonopin; and Levaquin  Home Medications  Prior to Admission medications   Medication Sig Start Date End Date Taking? Authorizing Provider  cloNIDine (CATAPRES) 0.1 MG tablet Take 1 tablet (0.1 mg total) by mouth 3 (three) times daily. 08/31/14   Jeoffrey Massed, MD  gabapentin (NEURONTIN) 100 MG capsule 1 tab po tid x 7d, then 2 tabs po tid, then 3 tabs po tid 09/05/14   Jeoffrey Massed, MD  HYDROmorphone (DILAUDID) 2 MG tablet Take by mouth 3 (three) times daily.    Historical Provider, MD  irbesartan-hydrochlorothiazide (AVALIDE) 150-12.5 MG per tablet Take 1 tablet by mouth daily. 09/05/14   Jeoffrey Massed, MD  metoprolol succinate (TOPROL-XL) 100 MG 24 hr tablet Take 100 mg by mouth daily. Take with or immediately following a meal.    Historical Provider, MD  ondansetron (ZOFRAN ODT) 4 MG disintegrating tablet Take 1 tablet (4 mg total) by mouth  every 8 (eight) hours as needed for nausea. 08/25/14   Rolland Porter, MD   BP 165/95 mmHg  Pulse 78  Temp(Src) 98.8 F (37.1 C) (Oral)  Resp 18  Ht  (1.6 m)  Wt 172 lb (78.019 kg)  BMI 30.48 kg/m2  SpO2 100% Physical Exam  Constitutional: She is oriented to person, place, and time. She appears well-developed and well-nourished. No distress.  HENT:  Head: Normocephalic.  Eyes: Conjunctivae are normal.  Neck: Neck supple. No tracheal deviation present.  Cardiovascular: Normal rate, regular rhythm and normal heart sounds.   Pulmonary/Chest: Effort normal and breath sounds normal. No respiratory distress.  Abdominal: Soft. Bowel sounds are normal. She exhibits no distension and no mass. There is tenderness (mild LLQ). There is no rebound and no guarding.  Neurological: She is alert and oriented to person, place, and time.  Skin: Skin is warm and dry.  Psychiatric: She has a normal mood and affect.    ED Course  Procedures (including critical care time) Labs Review Labs Reviewed  CBC WITH DIFFERENTIAL/PLATELET - Abnormal; Notable for the following:    WBC 10.8 (*)    Hemoglobin 15.1 (*)    All other components within normal limits  LIPASE, BLOOD - Abnormal; Notable for the following:    Lipase 61 (*)    All other components within normal limits  COMPREHENSIVE METABOLIC PANEL - Abnormal; Notable for the following:    AST 14 (*)    ALT 11 (*)    All other components within normal limits  URINALYSIS, ROUTINE W REFLEX MICROSCOPIC (NOT AT [facility])  I-STAT CG4 LACTIC ACID, ED    Imaging Review No results found. I have personally reviewed and evaluated these images and lab results as part of my medical decision-making.   EKG Interpretation None      MDM   Final diagnoses:  Abdominal pain, chronic, left lower quadrant    49 y.o. female presents with continued acute on chronic LLQ abdominal pain that has been present intermittently over the last 10 years per recent PCP  documentation. Had CT scan performed 27 days ago that was negative for acute pathology. Labs are unremarkable for any significant change since that time and Pt otherwise well appearing with no acute findings on exam. Tolerating PO. NO indication for repeat imaging today. Has f/u with GI. Recommended continued supportive care measures until able to make it to appointment. Plan to follow up with PCP as needed and return precautions discussed for worsening or new concerning symptoms.     Lyndal Pulley, MD 10/03/14 [phone]

## 2014-10-06 DIAGNOSIS — Z8711 Personal history of peptic ulcer disease: Secondary | ICD-10-CM | POA: Diagnosis not present

## 2014-10-06 DIAGNOSIS — G8929 Other chronic pain: Secondary | ICD-10-CM | POA: Diagnosis not present

## 2014-10-06 DIAGNOSIS — I10 Essential (primary) hypertension: Secondary | ICD-10-CM | POA: Diagnosis not present

## 2014-10-06 DIAGNOSIS — Z79891 Long term (current) use of opiate analgesic: Secondary | ICD-10-CM | POA: Diagnosis not present

## 2014-10-06 DIAGNOSIS — Z87442 Personal history of urinary calculi: Secondary | ICD-10-CM | POA: Diagnosis not present

## 2014-10-06 DIAGNOSIS — K5909 Other constipation: Secondary | ICD-10-CM | POA: Diagnosis not present

## 2014-10-06 DIAGNOSIS — Z888 Allergy status to other drugs, medicaments and biological substances status: Secondary | ICD-10-CM | POA: Diagnosis not present

## 2014-10-06 DIAGNOSIS — G9050 Complex regional pain syndrome I, unspecified: Secondary | ICD-10-CM | POA: Diagnosis not present

## 2014-10-06 DIAGNOSIS — F17210 Nicotine dependence, cigarettes, uncomplicated: Secondary | ICD-10-CM | POA: Diagnosis not present

## 2014-10-06 DIAGNOSIS — M5416 Radiculopathy, lumbar region: Secondary | ICD-10-CM | POA: Diagnosis not present

## 2014-10-06 DIAGNOSIS — M545 Low back pain: Secondary | ICD-10-CM | POA: Diagnosis not present

## 2014-10-06 DIAGNOSIS — G894 Chronic pain syndrome: Secondary | ICD-10-CM | POA: Diagnosis not present

## 2014-10-06 DIAGNOSIS — G90519 Complex regional pain syndrome I of unspecified upper limb: Secondary | ICD-10-CM | POA: Diagnosis not present

## 2014-10-06 DIAGNOSIS — R0789 Other chest pain: Secondary | ICD-10-CM | POA: Diagnosis not present

## 2014-11-09 DIAGNOSIS — R109 Unspecified abdominal pain: Secondary | ICD-10-CM | POA: Diagnosis not present

## 2014-11-09 DIAGNOSIS — R1032 Left lower quadrant pain: Secondary | ICD-10-CM | POA: Diagnosis not present

## 2014-11-09 DIAGNOSIS — M546 Pain in thoracic spine: Secondary | ICD-10-CM | POA: Diagnosis not present

## 2014-11-09 DIAGNOSIS — R110 Nausea: Secondary | ICD-10-CM | POA: Diagnosis not present

## 2014-11-09 DIAGNOSIS — F17210 Nicotine dependence, cigarettes, uncomplicated: Secondary | ICD-10-CM | POA: Diagnosis not present

## 2014-11-09 DIAGNOSIS — I10 Essential (primary) hypertension: Secondary | ICD-10-CM | POA: Diagnosis not present

## 2014-11-09 DIAGNOSIS — G8929 Other chronic pain: Secondary | ICD-10-CM | POA: Diagnosis not present

## 2014-11-16 ENCOUNTER — Encounter: Payer: Self-pay | Admitting: Family Medicine

## 2014-11-23 DIAGNOSIS — G8929 Other chronic pain: Secondary | ICD-10-CM | POA: Diagnosis not present

## 2014-11-23 DIAGNOSIS — M47816 Spondylosis without myelopathy or radiculopathy, lumbar region: Secondary | ICD-10-CM | POA: Diagnosis not present

## 2014-11-23 DIAGNOSIS — G90519 Complex regional pain syndrome I of unspecified upper limb: Secondary | ICD-10-CM | POA: Diagnosis not present

## 2014-11-24 ENCOUNTER — Other Ambulatory Visit: Payer: Self-pay | Admitting: *Deleted

## 2014-11-24 MED ORDER — CLONIDINE HCL 0.1 MG PO TABS: 0.1000 mg | ORAL_TABLET | Freq: Three times a day (TID) | ORAL | Status: DC

## 2014-11-24 NOTE — Telephone Encounter (Signed)
Refilled Catapres per refill protocol. Patient need office visit prior to anymore refills.

## 2014-12-05 DIAGNOSIS — R110 Nausea: Secondary | ICD-10-CM | POA: Diagnosis not present

## 2014-12-05 DIAGNOSIS — Z8711 Personal history of peptic ulcer disease: Secondary | ICD-10-CM | POA: Diagnosis not present

## 2014-12-05 DIAGNOSIS — Z79899 Other long term (current) drug therapy: Secondary | ICD-10-CM | POA: Diagnosis not present

## 2014-12-05 DIAGNOSIS — F17210 Nicotine dependence, cigarettes, uncomplicated: Secondary | ICD-10-CM | POA: Diagnosis not present

## 2014-12-05 DIAGNOSIS — R1013 Epigastric pain: Secondary | ICD-10-CM | POA: Diagnosis not present

## 2014-12-05 DIAGNOSIS — K5900 Constipation, unspecified: Secondary | ICD-10-CM | POA: Diagnosis not present

## 2014-12-05 DIAGNOSIS — R51 Headache: Secondary | ICD-10-CM | POA: Diagnosis not present

## 2014-12-05 DIAGNOSIS — Z888 Allergy status to other drugs, medicaments and biological substances status: Secondary | ICD-10-CM | POA: Diagnosis not present

## 2014-12-05 DIAGNOSIS — R1032 Left lower quadrant pain: Secondary | ICD-10-CM | POA: Diagnosis not present

## 2014-12-05 DIAGNOSIS — Z87442 Personal history of urinary calculi: Secondary | ICD-10-CM | POA: Diagnosis not present

## 2014-12-05 DIAGNOSIS — I10 Essential (primary) hypertension: Secondary | ICD-10-CM | POA: Diagnosis not present

## 2014-12-05 DIAGNOSIS — K8020 Calculus of gallbladder without cholecystitis without obstruction: Secondary | ICD-10-CM | POA: Diagnosis not present

## 2014-12-05 DIAGNOSIS — K5730 Diverticulosis of large intestine without perforation or abscess without bleeding: Secondary | ICD-10-CM | POA: Diagnosis not present

## 2014-12-05 DIAGNOSIS — E785 Hyperlipidemia, unspecified: Secondary | ICD-10-CM | POA: Diagnosis not present

## 2014-12-05 DIAGNOSIS — F419 Anxiety disorder, unspecified: Secondary | ICD-10-CM | POA: Diagnosis not present

## 2014-12-05 DIAGNOSIS — K5790 Diverticulosis of intestine, part unspecified, without perforation or abscess without bleeding: Secondary | ICD-10-CM | POA: Diagnosis not present

## 2014-12-05 DIAGNOSIS — R10814 Left lower quadrant abdominal tenderness: Secondary | ICD-10-CM | POA: Diagnosis not present

## 2014-12-05 DIAGNOSIS — G8929 Other chronic pain: Secondary | ICD-10-CM | POA: Diagnosis not present

## 2014-12-05 DIAGNOSIS — R10816 Epigastric abdominal tenderness: Secondary | ICD-10-CM | POA: Diagnosis not present

## 2014-12-05 DIAGNOSIS — G90512 Complex regional pain syndrome I of left upper limb: Secondary | ICD-10-CM | POA: Diagnosis not present

## 2014-12-17 ENCOUNTER — Emergency Department (HOSPITAL_BASED_OUTPATIENT_CLINIC_OR_DEPARTMENT_OTHER)
Admission: EM | Admit: 2014-12-17 | Discharge: 2014-12-17 | Disposition: A | Discharge status: Home / Self Care | Payer: Medicare Other | Attending: Emergency Medicine | Admitting: Emergency Medicine

## 2014-12-17 ENCOUNTER — Emergency Department (HOSPITAL_BASED_OUTPATIENT_CLINIC_OR_DEPARTMENT_OTHER): Payer: Medicare Other

## 2014-12-17 ENCOUNTER — Encounter (HOSPITAL_BASED_OUTPATIENT_CLINIC_OR_DEPARTMENT_OTHER): Payer: Self-pay | Admitting: Emergency Medicine

## 2014-12-17 DIAGNOSIS — B349 Viral infection, unspecified: Secondary | ICD-10-CM

## 2014-12-17 DIAGNOSIS — F329 Major depressive disorder, single episode, unspecified: Secondary | ICD-10-CM | POA: Insufficient documentation

## 2014-12-17 DIAGNOSIS — Z8719 Personal history of other diseases of the digestive system: Secondary | ICD-10-CM | POA: Diagnosis not present

## 2014-12-17 DIAGNOSIS — R1013 Epigastric pain: Secondary | ICD-10-CM | POA: Diagnosis not present

## 2014-12-17 DIAGNOSIS — R3915 Urgency of urination: Secondary | ICD-10-CM | POA: Insufficient documentation

## 2014-12-17 DIAGNOSIS — G8929 Other chronic pain: Secondary | ICD-10-CM | POA: Diagnosis not present

## 2014-12-17 DIAGNOSIS — R000 Tachycardia, unspecified: Secondary | ICD-10-CM | POA: Insufficient documentation

## 2014-12-17 DIAGNOSIS — R509 Fever, unspecified: Secondary | ICD-10-CM | POA: Diagnosis not present

## 2014-12-17 DIAGNOSIS — F419 Anxiety disorder, unspecified: Secondary | ICD-10-CM | POA: Insufficient documentation

## 2014-12-17 DIAGNOSIS — R112 Nausea with vomiting, unspecified: Secondary | ICD-10-CM | POA: Diagnosis not present

## 2014-12-17 DIAGNOSIS — Z79899 Other long term (current) drug therapy: Secondary | ICD-10-CM | POA: Diagnosis not present

## 2014-12-17 DIAGNOSIS — Z86010 Personal history of colonic polyps: Secondary | ICD-10-CM | POA: Diagnosis not present

## 2014-12-17 DIAGNOSIS — I10 Essential (primary) hypertension: Secondary | ICD-10-CM | POA: Insufficient documentation

## 2014-12-17 DIAGNOSIS — F17210 Nicotine dependence, cigarettes, uncomplicated: Secondary | ICD-10-CM | POA: Diagnosis not present

## 2014-12-17 DIAGNOSIS — R0981 Nasal congestion: Secondary | ICD-10-CM | POA: Diagnosis present

## 2014-12-17 DIAGNOSIS — R197 Diarrhea, unspecified: Secondary | ICD-10-CM | POA: Insufficient documentation

## 2014-12-17 LAB — CBC
HCT: 43.9 % (ref 36.0–46.0)
Hemoglobin: 15 g/dL (ref 12.0–15.0)
MCH: 31.1 pg (ref 26.0–34.0)
MCHC: 34.2 g/dL (ref 30.0–36.0)
MCV: 91.1 fL (ref 78.0–100.0)
PLATELETS: 298 10*3/uL (ref 150–400)
RBC: 4.82 MIL/uL (ref 3.87–5.11)
RDW: 13.6 % (ref 11.5–15.5)
WBC: 13.8 10*3/uL — AB (ref 4.0–10.5)

## 2014-12-17 LAB — URINALYSIS, ROUTINE W REFLEX MICROSCOPIC
BILIRUBIN URINE: NEGATIVE
GLUCOSE, UA: NEGATIVE mg/dL
HGB URINE DIPSTICK: NEGATIVE
KETONES UR: NEGATIVE mg/dL
Leukocytes, UA: NEGATIVE
Nitrite: NEGATIVE
PROTEIN: NEGATIVE mg/dL
Specific Gravity, Urine: 1.01 (ref 1.005–1.030)
pH: 6 (ref 5.0–8.0)

## 2014-12-17 LAB — HEPATIC FUNCTION PANEL
ALK PHOS: 82 U/L (ref 38–126)
ALT: 14 U/L (ref 14–54)
AST: 15 U/L (ref 15–41)
Albumin: 4.4 g/dL (ref 3.5–5.0)
TOTAL PROTEIN: 8.1 g/dL (ref 6.5–8.1)
Total Bilirubin: 0.7 mg/dL (ref 0.3–1.2)

## 2014-12-17 LAB — TROPONIN I: Troponin I: <0.03 ng/mL (ref <0.031)

## 2014-12-17 LAB — LIPASE, BLOOD: Lipase: 22 U/L (ref 11–51)

## 2014-12-17 LAB — BASIC METABOLIC PANEL
Anion gap: 8 (ref 5–15)
BUN: 11 mg/dL (ref 6–20)
CO2: 26 mmol/L (ref 22–32)
CREATININE: 0.77 mg/dL (ref 0.44–1.00)
Calcium: 9.1 mg/dL (ref 8.9–10.3)
Chloride: 103 mmol/L (ref 101–111)
GFR calc Af Amer: >60 mL/min (ref >60)
GLUCOSE: 103 mg/dL — AB (ref 65–99)
Potassium: 3.6 mmol/L (ref 3.5–5.1)
SODIUM: 137 mmol/L (ref 135–145)

## 2014-12-17 MED ORDER — MORPHINE SULFATE (PF) 4 MG/ML IV SOLN
4.0000 mg | Freq: Once | INTRAVENOUS | Status: AC
  Administered 2014-12-17: 4 mg via INTRAVENOUS
  Filled 2014-12-17: qty 1

## 2014-12-17 MED ORDER — SODIUM CHLORIDE 0.9 % IV BOLUS (SEPSIS)
500.0000 mL | Freq: Once | INTRAVENOUS | Status: AC
  Administered 2014-12-17: 500 mL via INTRAVENOUS

## 2014-12-17 MED ORDER — HYDROMORPHONE HCL 2 MG PO TABS
2.0000 mg | ORAL_TABLET | Freq: Once | ORAL | Status: DC
  Filled 2014-12-17: qty 1

## 2014-12-17 MED ORDER — HYDROMORPHONE HCL 1 MG/ML IJ SOLN: 2.0000 mg | Freq: Once | INTRAMUSCULAR | Status: DC

## 2014-12-17 MED ORDER — ONDANSETRON HCL 4 MG/2ML IJ SOLN
4.0000 mg | Freq: Once | INTRAMUSCULAR | Status: AC
  Administered 2014-12-17: 4 mg via INTRAVENOUS
  Filled 2014-12-17: qty 2

## 2014-12-17 MED ORDER — CLONIDINE HCL 0.1 MG PO TABS
0.1000 mg | ORAL_TABLET | Freq: Once | ORAL | Status: AC
  Administered 2014-12-17: 0.1 mg via ORAL
  Filled 2014-12-17: qty 1

## 2014-12-17 MED ORDER — OXYCODONE HCL 5 MG PO TABS
15.0000 mg | ORAL_TABLET | Freq: Once | ORAL | Status: AC
  Administered 2014-12-17: 15 mg via ORAL
  Filled 2014-12-17: qty 3

## 2014-12-17 MED ORDER — KETOROLAC TROMETHAMINE 30 MG/ML IJ SOLN
30.0000 mg | Freq: Once | INTRAMUSCULAR | Status: AC
  Administered 2014-12-17: 30 mg via INTRAVENOUS
  Filled 2014-12-17: qty 1

## 2014-12-17 MED ORDER — SODIUM CHLORIDE 0.9 % IV BOLUS (SEPSIS)
1000.0000 mL | Freq: Once | INTRAVENOUS | Status: AC
  Administered 2014-12-17: 1000 mL via INTRAVENOUS

## 2014-12-17 NOTE — ED Notes (Signed)
Pt states has poor appetite, has not taken in food since yesterday

## 2014-12-17 NOTE — ED Notes (Signed)
States not feeling any better after IV morphine, noted to be slightly tachycardic on cardiac monitor, HR 104/min, ST, w/o ectopy.

## 2014-12-17 NOTE — ED Notes (Signed)
12 lead ECG done and to EDP for evaluation

## 2014-12-17 NOTE — ED Notes (Signed)
Family at bedside. 

## 2014-12-17 NOTE — Discharge Instructions (Signed)
Viral Infections °A viral infection can be caused by different types of viruses. Most viral infections are not serious and resolve on their own. However, some infections may cause severe symptoms and may lead to further complications. °SYMPTOMS °Viruses can frequently cause: °· Minor sore throat. °· Aches and pains. °· Headaches. °· Runny nose. °· Different types of rashes. °· Watery eyes. °· Tiredness. °· Cough. °· Loss of appetite. °· Gastrointestinal infections, resulting in nausea, vomiting, and diarrhea. °These symptoms do not respond to antibiotics because the infection is not caused by bacteria. However, you might catch a bacterial infection following the viral infection. This is sometimes called a "superinfection." Symptoms of such a bacterial infection may include: °· Worsening sore throat with pus and difficulty swallowing. °· Swollen neck glands. °· Chills and a high or persistent fever. °· Severe headache. °· Tenderness over the sinuses. °· Persistent overall ill feeling (malaise), muscle aches, and tiredness (fatigue). °· Persistent cough. °· Yellow, green, or brown mucus production with coughing. °HOME CARE INSTRUCTIONS  °· Only take over-the-counter or prescription medicines for pain, discomfort, diarrhea, or fever as directed by your caregiver. °· Drink enough water and fluids to keep your urine clear or pale yellow. Sports drinks can provide valuable electrolytes, sugars, and hydration. °· Get plenty of rest and maintain proper nutrition. Soups and broths with crackers or rice are fine. °SEEK IMMEDIATE MEDICAL CARE IF:  °· You have severe headaches, shortness of breath, chest pain, neck pain, or an unusual rash. °· You have uncontrolled vomiting, diarrhea, or you are unable to keep down fluids. °· You or your child has an oral temperature above 102° F (38.9° C), not controlled by medicine. °· Your baby is older than 3 months with a rectal temperature of 102° F (38.9° C) or higher. °· Your baby is 3  months old or younger with a rectal temperature of 100.4° F (38° C) or higher. °MAKE SURE YOU:  °· Understand these instructions. °· Will watch your condition. °· Will get help right away if you are not doing well or get worse. °  °This information is not intended to replace advice given to you by your health care provider. Make sure you discuss any questions you have with your health care provider. °  °Document Released: 10/03/2004 Document Revised: 03/18/2011 Document Reviewed: 06/01/2014 °Elsevier Interactive Patient Education ©2016 [employer] ° °

## 2014-12-17 NOTE — ED Notes (Signed)
Pt did ambulate with RN to the bathroom without any assistance.

## 2014-12-17 NOTE — ED Notes (Signed)
Patient is resting comfortably. 

## 2014-12-17 NOTE — ED Notes (Signed)
ORAL TEMP: 98.5

## 2014-12-17 NOTE — ED Notes (Signed)
Pt alert, NAD, calm, interactive, resps e/u, nasal congestion noted, states, "feel much better".

## 2014-12-17 NOTE — ED Provider Notes (Signed)
CSN: [id number]     Arrival date & time 12/17/14  1547 History  By signing my name below, I, Freida Busmaniana Omoyeni, attest that this documentation has been prepared under the direction and in the presence of Laurence Spatesachel Morgan Vue Pavon, MD . Electronically Signed: Freida Busmaniana Omoyeni, Scribe. 12/17/2014. 4:43 PM.     Chief Complaint  Patient presents with  . Chest Pain  . Abdominal Pain    The history is provided by the patient. No language interpreter was used.     HPI Comments:  Dana Morrow is a 49 y.o. female with a history of reflex sympathetic dystrophy on chronic narcotics, chronic abdominal pain, GERD, HTN and HLD,  who presents to the Emergency Department complaining of non-radiating epigastric pain that began today. Pt reports  h/o chronic abdominal pain for 2 years secondary to surgery and diverticulitis; notes pain today is different. She also reports associated back pain, nasal congestion, cough, nausea, diarrhea. 1 episode of vomiting yesterday,  and urinary urgency. She denies SOB. She has taken phenergan w/ some relief of nausea and vomiting. No sick contacts.  Pt is currently in pain management. She is on chronic narcotics including dilaudid daily.  Past Medical History  Diagnosis Date  . RSD (reflex sympathetic dystrophy)     a. L shoulder--[facility] [place] and/or w/s.  . Migraine   . Colon polyps     a. s/p colonoscopy and polypectomy in 2003  . Abdominal pain, chronic, left lower quadrant     a. s/p Laproscopy in 2004 w/o finding of source of discomfort.  [name]. GERD (gastroesophageal reflux disease)   . Hiatal hernia     a. small by EGD 2006.  [name]. Visual disturbance     a. 12/2011 Left eye visual changes->w/u for TIA->Carotid U/S:  No ICA stenosis; Head CT: No acute abnl; MRI/MRA: mild RPCA stenosis; Echo: EF 55-60%, Triv AI, No PFO.  . Tobacco abuse     a. 50 pack year hx.  . Cholelithiasis     a. s/p cholecystectomy 10/2011  . Chest pain     a.  reportedly nl MV and echo 10/2011 - [place]  . Diverticulosis     with ? of 'itis  . Hypertension   . Family history of colon cancer     mother  . History of amaurosis fugax 01/2011    L eye; saw neuro at [facility] (Dr. Johnell ComingsMieden).  Lab w/u and carotid doppler nl per old records.  . Anxiety and depression 2013    Lots of stress  . Hyperlipidemia     LDL 191 in 2013 per old records   Past Surgical History  Procedure Laterality Date  . Cesarean section  1990  . Shoulder surgery Left 1997 &98  . Laparoscopic cholecystectomy  10/19/11  . Umbilical hernia repair  2014    with mesh  . Laparoscopy  05/2002    Dr. Harmon Dunimothy Davis--no cause for LLQ abd pain found  . Endometrial ablation  2011  . Colonoscopy  10/2001    Dr. Ewing SchleinMagod; diverticulosis, int/ext hem, one small polyp removed.  . Esophagogastroduodenoscopy  02/2004    Dr. Ewing SchleinMagod; normal   Family History  Problem Relation Age of Onset  . Dementia Father   . Parkinson's disease Father     d. 2013  . Hypertension Father   . Hyperlipidemia Father   . Heart disease Father   . Heart attack Mother     died @  51  . Colon cancer Mother   . Hyperlipidemia Mother   . Heart disease Mother   . Hypertension Mother   . Esophageal cancer Brother   . Diabetes Neg Hx    Social History  Substance Use Topics  . Smoking status: Current Every Day Smoker -- 0.50 packs/day for 30 years    Types: Cigarettes  . Smokeless tobacco: Never Used     Comment: Smoked 2ppd for roughly 25 yrs but cut back to 1/2 ppd about 6 mos ago.  . Alcohol Use: No   OB History    No data available     Review of Systems  10 systems reviewed and all are negative for acute change except as noted in the HPI.   Allergies  Flagyl; Elavil; Klonopin; and Levaquin  Home Medications   Prior to Admission medications   Medication Sig Start Date End Date Taking? Authorizing Provider  promethazine (PHENERGAN) 25 MG tablet Take 25 mg  by mouth every 6 (six) hours as needed for nausea or vomiting.   Yes Historical Provider, MD  cloNIDine (CATAPRES) 0.1 MG tablet Take 1 tablet (0.1 mg total) by mouth 3 (three) times daily. 11/24/14   Jeoffrey Massed, MD  gabapentin (NEURONTIN) 100 MG capsule 1 tab po tid x 7d, then 2 tabs po tid, then 3 tabs po tid 09/05/14   Jeoffrey Massed, MD  HYDROmorphone (DILAUDID) 2 MG tablet Take by mouth 3 (three) times daily.    Historical Provider, MD  irbesartan-hydrochlorothiazide (AVALIDE) 150-12.5 MG per tablet Take 1 tablet by mouth daily. 09/05/14   Jeoffrey Massed, MD  metoprolol succinate (TOPROL-XL) 100 MG 24 hr tablet Take 100 mg by mouth daily. Take with or immediately following a meal.    Historical Provider, MD  ondansetron (ZOFRAN ODT) 4 MG disintegrating tablet Take 1 tablet (4 mg total) by mouth every 8 (eight) hours as needed for nausea. 08/25/14   Rolland Porter, MD   BP 139/89 mmHg  Pulse 98  Temp(Src) 98.2 F (36.8 C) (Oral)  Resp 17  Ht  (1.6 m)  Wt 172 lb (78.019 kg)  BMI 30.48 kg/m2  SpO2 95% Physical Exam  Constitutional: She is oriented to person, place, and time. She appears well-developed and well-nourished. No distress.  HENT:  Head: Normocephalic and atraumatic.  Mouth/Throat: Oropharynx is clear and moist.  Moist mucous membranes  Eyes: Conjunctivae are normal. Pupils are equal, round, and reactive to light.  Neck: Neck supple.  Cardiovascular: Regular rhythm and normal heart sounds.  Tachycardia present.   No murmur heard. Pulmonary/Chest: Effort normal and breath sounds normal.  Abdominal: Soft. Bowel sounds are normal. She exhibits no distension. There is tenderness. There is no rebound and no guarding.  Mild TTP of mid epigastrum with no rebound or guarding  Musculoskeletal: She exhibits no edema.  Neurological: She is alert and oriented to person, place, and time.  Fluent speech  Skin: Skin is warm and dry.  Psychiatric: Judgment normal.  Mildly  anxious  Nursing note and vitals reviewed.   ED Course  Procedures   DIAGNOSTIC STUDIES:  Oxygen Saturation is 100% on RA, normal by my interpretation.    COORDINATION OF CARE:  4:31 PM Discussed treatment plan with pt at bedside and pt agreed to plan.  Labs Review Labs Reviewed  BASIC METABOLIC PANEL - Abnormal; Notable for the following:    Glucose, Bld 103 (*)    All other components within normal limits  CBC -  Abnormal; Notable for the following:    WBC 13.8 (*)    All other components within normal limits  HEPATIC FUNCTION PANEL - Abnormal; Notable for the following:    Bilirubin, Direct <0.1 (*)    All other components within normal limits  TROPONIN I  LIPASE, BLOOD  URINALYSIS, ROUTINE W REFLEX MICROSCOPIC (NOT AT [facility])    Imaging Review Dg Chest 2 View  12/17/2014  CLINICAL DATA:  Chest pain since this morning with no shortness of breath or fever, patient smokes EXAM: CHEST  2 VIEW COMPARISON:  04/14/2014 and multiple prior studies FINDINGS: Moderate elevation of the right diaphragm similar to prior studies. Lungs clear. Heart size normal. Hilar and mediastinal contours normal. IMPRESSION: No acute findings. Right diaphragm elevated similar to numerous prior studies. Electronically Signed   By: Esperanza Heir M.D.   On: 12/17/2014 16:58   I have personally reviewed and evaluated these lab results as part of my medical decision-making.   EKG Interpretation   Date/Time:  Saturday December 17 2014 16:04:53 EST Ventricular Rate:  105 PR Interval:  157 QRS Duration: 82 QT Interval:  344 QTC Calculation: 455 R Axis:   64 Text Interpretation:  Sinus tachycardia Probable left atrial enlargement  Baseline wander in lead(s) V6 sinus tachycardia is similar to previous EKG  Confirmed by Shaquella Stamant MD, Masayo Fera ([id number]) on 12/17/2014 4:52:18 PM      MDM   Final diagnoses:  Viral syndrome    Pt presenting with 2 days of epigastric abdominal pain associated with nasal  congestion, cough, vomiting, diarrhea and malaise. On exam, patient was uncomfortable with nasal congestion. No respiratory distress, and TTP of midepigastrium. EKG shows sinus tachycardia similar to previous EKG. Chest x-ray unremarkable. Obtained above lab work which showed a leukocytosis similar to previous labs, otherwise unremarkable.  Gave the patient an IV fluid bolus, Zofran, morphine, Toradol, and later oxycodone and clonidine for her nighttime medications. On reexamination, the patient stated that she felt much improved. Her symptoms are consistent with a viral syndrome and I've instructed on supportive care. Return precautions reviewed. Patient voiced understanding and was discharged in satisfactory condition.   Medications  sodium chloride 0.9 % bolus 1,000 mL (0 mLs Intravenous Stopped 12/17/14 1745)  ondansetron (ZOFRAN) injection 4 mg (4 mg Intravenous Given 12/17/14 1620)  morphine 4 MG/ML injection 4 mg (4 mg Intravenous Given 12/17/14 1635)  ketorolac (TORADOL) 30 MG/ML injection 30 mg (30 mg Intravenous Given 12/17/14 1817)  sodium chloride 0.9 % bolus 500 mL (0 mLs Intravenous Stopped 12/17/14 1840)  cloNIDine (CATAPRES) tablet 0.1 mg (0.1 mg Oral Given 12/17/14 1957)  oxyCODONE (Oxy IR/ROXICODONE) immediate release tablet 15 mg (15 mg Oral Given 12/17/14 1957)   I personally performed the services described in this documentation, which was scribed in my presence. The recorded information has been reviewed and is accurate.    Laurence Spates, MD 12/17/14 [phone]

## 2014-12-17 NOTE — ED Notes (Signed)
Pt returns from radiology, immediately placed back on cont cardiac monitoring, chest pain reassessed, additional EDP orders noted and implemented

## 2014-12-17 NOTE — ED Notes (Signed)
Placed on cont cardiac monitor with int NBP and cont POX monitoring

## 2014-12-17 NOTE — ED Notes (Signed)
Onset of chest pain, non-radiating, onset at approx 1100hrs today, had nausea, vomited x 1, states also had diarrhea today. States was lying on the sofa when chest pain began

## 2014-12-17 NOTE — ED Notes (Signed)
Pt states epigastric pain, lower chest pain since yesterday, constant, burning, stabbing, non-radiating.  Pt also c/o flu-like symptoms, nasal congestion, nausea, cough.

## 2014-12-17 NOTE — ED Notes (Signed)
Dr. Little at [facility] 

## 2014-12-19 ENCOUNTER — Emergency Department (HOSPITAL_BASED_OUTPATIENT_CLINIC_OR_DEPARTMENT_OTHER): Payer: Medicare Other

## 2014-12-19 ENCOUNTER — Emergency Department (HOSPITAL_BASED_OUTPATIENT_CLINIC_OR_DEPARTMENT_OTHER)
Admission: EM | Admit: 2014-12-19 | Discharge: 2014-12-19 | Disposition: A | Discharge status: Home / Self Care | Payer: Medicare Other | Attending: Emergency Medicine | Admitting: Emergency Medicine

## 2014-12-19 ENCOUNTER — Encounter (HOSPITAL_BASED_OUTPATIENT_CLINIC_OR_DEPARTMENT_OTHER): Payer: Self-pay

## 2014-12-19 DIAGNOSIS — J0110 Acute frontal sinusitis, unspecified: Secondary | ICD-10-CM

## 2014-12-19 DIAGNOSIS — G43909 Migraine, unspecified, not intractable, without status migrainosus: Secondary | ICD-10-CM | POA: Insufficient documentation

## 2014-12-19 DIAGNOSIS — F329 Major depressive disorder, single episode, unspecified: Secondary | ICD-10-CM | POA: Insufficient documentation

## 2014-12-19 DIAGNOSIS — F17210 Nicotine dependence, cigarettes, uncomplicated: Secondary | ICD-10-CM | POA: Insufficient documentation

## 2014-12-19 DIAGNOSIS — G8929 Other chronic pain: Secondary | ICD-10-CM | POA: Insufficient documentation

## 2014-12-19 DIAGNOSIS — I10 Essential (primary) hypertension: Secondary | ICD-10-CM | POA: Diagnosis not present

## 2014-12-19 DIAGNOSIS — F419 Anxiety disorder, unspecified: Secondary | ICD-10-CM | POA: Diagnosis not present

## 2014-12-19 DIAGNOSIS — E860 Dehydration: Secondary | ICD-10-CM | POA: Diagnosis not present

## 2014-12-19 DIAGNOSIS — Z86010 Personal history of colonic polyps: Secondary | ICD-10-CM | POA: Diagnosis not present

## 2014-12-19 DIAGNOSIS — R000 Tachycardia, unspecified: Secondary | ICD-10-CM | POA: Insufficient documentation

## 2014-12-19 DIAGNOSIS — Z8719 Personal history of other diseases of the digestive system: Secondary | ICD-10-CM | POA: Insufficient documentation

## 2014-12-19 DIAGNOSIS — R0981 Nasal congestion: Secondary | ICD-10-CM | POA: Diagnosis present

## 2014-12-19 DIAGNOSIS — Z79899 Other long term (current) drug therapy: Secondary | ICD-10-CM | POA: Insufficient documentation

## 2014-12-19 LAB — COMPREHENSIVE METABOLIC PANEL
ALT: 14 U/L (ref 14–54)
AST: 16 U/L (ref 15–41)
Albumin: 4.1 g/dL (ref 3.5–5.0)
Alkaline Phosphatase: 78 U/L (ref 38–126)
Anion gap: 9 (ref 5–15)
BILIRUBIN TOTAL: 0.4 mg/dL (ref 0.3–1.2)
BUN: 14 mg/dL (ref 6–20)
CO2: 26 mmol/L (ref 22–32)
CREATININE: 0.81 mg/dL (ref 0.44–1.00)
Calcium: 9.3 mg/dL (ref 8.9–10.3)
Chloride: 104 mmol/L (ref 101–111)
GFR calc Af Amer: >60 mL/min (ref >60)
Glucose, Bld: 114 mg/dL — ABNORMAL HIGH (ref 65–99)
POTASSIUM: 3.9 mmol/L (ref 3.5–5.1)
Sodium: 139 mmol/L (ref 135–145)
TOTAL PROTEIN: 7.7 g/dL (ref 6.5–8.1)

## 2014-12-19 LAB — CBC WITH DIFFERENTIAL/PLATELET
BASOS ABS: 0.1 10*3/uL (ref 0.0–0.1)
Basophils Relative: 1 %
Eosinophils Absolute: 0.3 10*3/uL (ref 0.0–0.7)
Eosinophils Relative: 2 %
HEMATOCRIT: 44.7 % (ref 36.0–46.0)
Hemoglobin: 15.1 g/dL — ABNORMAL HIGH (ref 12.0–15.0)
LYMPHS PCT: 24 %
Lymphs Abs: 2.9 10*3/uL (ref 0.7–4.0)
MCH: 30.9 pg (ref 26.0–34.0)
MCHC: 33.8 g/dL (ref 30.0–36.0)
MCV: 91.4 fL (ref 78.0–100.0)
MONO ABS: 1.2 10*3/uL — AB (ref 0.1–1.0)
Monocytes Relative: 10 %
NEUTROS ABS: 7.8 10*3/uL — AB (ref 1.7–7.7)
Neutrophils Relative %: 63 %
Platelets: 292 10*3/uL (ref 150–400)
RBC: 4.89 MIL/uL (ref 3.87–5.11)
RDW: 13.8 % (ref 11.5–15.5)
WBC: 12.2 10*3/uL — ABNORMAL HIGH (ref 4.0–10.5)

## 2014-12-19 LAB — I-STAT CG4 LACTIC ACID, ED: Lactic Acid, Venous: 0.82 mmol/L (ref 0.5–2.0)

## 2014-12-19 LAB — TROPONIN I: TROPONIN I: 0.03 ng/mL (ref <0.031)

## 2014-12-19 MED ORDER — DOXYCYCLINE HYCLATE 100 MG PO TABS: 100.0000 mg | ORAL_TABLET | Freq: Two times a day (BID) | ORAL | Status: DC

## 2014-12-19 MED ORDER — HYDROMORPHONE HCL 1 MG/ML IJ SOLN
1.0000 mg | Freq: Once | INTRAMUSCULAR | Status: AC
  Administered 2014-12-19: 1 mg via INTRAVENOUS
  Filled 2014-12-19: qty 1

## 2014-12-19 MED ORDER — ONDANSETRON HCL 4 MG/2ML IJ SOLN
4.0000 mg | Freq: Once | INTRAMUSCULAR | Status: AC
  Administered 2014-12-19: 4 mg via INTRAVENOUS
  Filled 2014-12-19: qty 2

## 2014-12-19 MED ORDER — HYDROMORPHONE HCL 1 MG/ML IJ SOLN
0.5000 mg | Freq: Once | INTRAMUSCULAR | Status: AC
  Administered 2014-12-19: 0.5 mg via INTRAVENOUS
  Filled 2014-12-19: qty 1

## 2014-12-19 MED ORDER — DOXYCYCLINE HYCLATE 100 MG PO TABS
100.0000 mg | ORAL_TABLET | Freq: Once | ORAL | Status: AC
  Administered 2014-12-19: 100 mg via ORAL
  Filled 2014-12-19: qty 1

## 2014-12-19 MED ORDER — SODIUM CHLORIDE 0.9 % IV BOLUS (SEPSIS)
500.0000 mL | Freq: Once | INTRAVENOUS | Status: AC
  Administered 2014-12-19: 500 mL via INTRAVENOUS

## 2014-12-19 MED ORDER — CLONIDINE HCL 0.1 MG PO TABS
0.1000 mg | ORAL_TABLET | Freq: Once | ORAL | Status: AC
  Administered 2014-12-19: 0.1 mg via ORAL
  Filled 2014-12-19: qty 1

## 2014-12-19 MED ORDER — SODIUM CHLORIDE 0.9 % IV BOLUS (SEPSIS)
1000.0000 mL | Freq: Once | INTRAVENOUS | Status: AC
  Administered 2014-12-19: 1000 mL via INTRAVENOUS

## 2014-12-19 NOTE — Discharge Instructions (Signed)
Dehydration, Adult °Dehydration is a condition in which you do not have enough fluid or water in your body. It happens when you take in less fluid than you lose. Vital organs such as the kidneys, brain, and heart cannot function without a proper amount of fluids. Any loss of fluids from the body can cause dehydration.  °Dehydration can range from mild to severe. This condition should be treated right away to help prevent it from becoming severe. °CAUSES  °This condition may be caused by: °· Vomiting. °· Diarrhea. °· Excessive sweating, such as when exercising in hot or humid weather. °· Not drinking enough fluid during strenuous exercise or during an illness. °· Excessive urine output. °· Fever. °· Certain medicines. °RISK FACTORS °This condition is more likely to develop in: °· People who are taking certain medicines that cause the body to lose excess fluid (diuretics).   °· People who have a chronic illness, such as diabetes, that may increase urination. °· Older adults.   °· People who live at high altitudes.   °· People who participate in endurance sports.   °SYMPTOMS  °Mild Dehydration °· Thirst. °· Dry lips. °· Slightly dry mouth. °· Dry, warm skin. °Moderate Dehydration °· Very dry mouth.   °· Muscle cramps.   °· Dark urine and decreased urine production.   °· Decreased tear production.   °· Headache.   °· Light-headedness, especially when you stand up from a sitting position.   °Severe Dehydration °· Changes in skin.   °· Cold and clammy skin.   °· Skin does not spring back quickly when lightly pinched and released.   °· Changes in body fluids.   °· Extreme thirst.   °· No tears.   °· Not able to sweat when body temperature is high, such as in hot weather.   °· Minimal urine production.   °· Changes in vital signs.   °· Rapid, weak pulse (more than 100 beats per minute when you are sitting still).   °· Rapid breathing.   °· Low blood pressure.   °· Other changes.   °· Sunken eyes.   °· Cold hands and feet.    °· Confusion. °· Lethargy and difficulty being awakened. °· Fainting (syncope).   °· Short-term weight loss.   °· Unconsciousness. °DIAGNOSIS  °This condition may be diagnosed based on your symptoms. You may also have tests to determine how severe your dehydration is. These tests may include:  °· Urine tests.   °· Blood tests.   °TREATMENT  °Treatment for this condition depends on the severity. Mild or moderate dehydration can often be treated at home. Treatment should be started right away. Do not wait until dehydration becomes severe. Severe dehydration needs to be treated at the hospital. °Treatment for Mild Dehydration °· Drinking plenty of water to replace the fluid you have lost.   °· Replacing minerals in your blood (electrolytes) that you may have lost.   °Treatment for Moderate Dehydration  °· Consuming oral rehydration solution (ORS). °Treatment for Severe Dehydration °· Receiving fluid through an IV tube.   °· Receiving electrolyte solution through a feeding tube that is passed through your nose and into your stomach (nasogastric tube or NG tube). °· Correcting any abnormalities in electrolytes. °HOME CARE INSTRUCTIONS  °· Drink enough fluid to keep your urine clear or pale yellow.   °· Drink water or fluid slowly by taking small sips. You can also try sucking on ice cubes.  °· Have food or beverages that contain electrolytes. Examples include bananas and sports drinks. °· Take over-the-counter and prescription medicines only as told by your health care provider.   °· Prepare ORS according to the manufacturer's instructions. Take sips   of ORS every 5 minutes until your urine returns to normal. °· If you have vomiting or diarrhea, continue to try to drink water, ORS, or both.   °· If you have diarrhea, avoid:   °¨ Beverages that contain caffeine.   °¨ Fruit juice.   °¨ Milk.    °¨ Carbonated soft drinks. °· Do not take salt tablets. This can lead to the condition of having too much sodium in your body  (hypernatremia).   °SEEK MEDICAL CARE IF: °· You cannot eat or drink without vomiting. °· You have had moderate diarrhea during a period of more than 24 hours. °· You have a fever. °SEEK IMMEDIATE MEDICAL CARE IF:  °· You have extreme thirst. °· You have severe diarrhea. °· You have not urinated in 6-8 hours, or you have urinated only a small amount of very dark urine. °· You have shriveled skin. °· You are dizzy, confused, or both. °  °This information is not intended to replace advice given to you by your health care provider. Make sure you discuss any questions you have with your health care provider. °  °Document Released: 12/24/2004 Document Revised: 09/14/2014 Document Reviewed: 05/11/2014 °Elsevier Interactive Patient Education ©2016 [employer] °Sinusitis, Adult °Sinusitis is redness, soreness, and inflammation of the paranasal sinuses. Paranasal sinuses are air pockets within the bones of your face. They are located beneath your eyes, in the middle of your forehead, and above your eyes. In healthy paranasal sinuses, mucus is able to drain out, and air is able to circulate through them by way of your nose. However, when your paranasal sinuses are inflamed, mucus and air can become trapped. This can allow bacteria and other germs to grow and cause infection. °Sinusitis can develop quickly and last only a short time (acute) or continue over a long period (chronic). Sinusitis that lasts for more than 12 weeks is considered chronic. °CAUSES °Causes of sinusitis include: °· Allergies. °· Structural abnormalities, such as displacement of the cartilage that separates your nostrils (deviated septum), which can decrease the air flow through your nose and sinuses and affect sinus drainage. °· Functional abnormalities, such as when the small hairs (cilia) that line your sinuses and help remove mucus do not work properly or are not present. °SIGNS AND SYMPTOMS °Symptoms of acute and chronic sinusitis are the same. The  primary symptoms are pain and pressure around the affected sinuses. Other symptoms include: °· Upper toothache. °· Earache. °· Headache. °· Bad breath. °· Decreased sense of smell and taste. °· A cough, which worsens when you are lying flat. °· Fatigue. °· Fever. °· Thick drainage from your nose, which often is green and may contain pus (purulent). °· Swelling and warmth over the affected sinuses. °DIAGNOSIS °Your health care provider will perform a physical exam. During your exam, your health care provider may perform any of the following to help determine if you have acute sinusitis or chronic sinusitis: °· Look in your nose for signs of abnormal growths in your nostrils (nasal polyps). °· Tap over the affected sinus to check for signs of infection. °· View the inside of your sinuses using an imaging device that has a light attached (endoscope). °If your health care provider suspects that you have chronic sinusitis, one or more of the following tests may be recommended: °· Allergy tests. °· Nasal culture. A sample of mucus is taken from your nose, sent to a lab, and screened for bacteria. °· Nasal cytology. A sample of mucus is taken from your nose and examined by   your health care provider to determine if your sinusitis is related to an allergy. °TREATMENT °Most cases of acute sinusitis are related to a viral infection and will resolve on their own within 10 days. Sometimes, medicines are prescribed to help relieve symptoms of both acute and chronic sinusitis. These may include pain medicines, decongestants, nasal steroid sprays, or saline sprays. °However, for sinusitis related to a bacterial infection, your health care provider will prescribe antibiotic medicines. These are medicines that will help kill the bacteria causing the infection. °Rarely, sinusitis is caused by a fungal infection. In these cases, your health care provider will prescribe antifungal medicine. °For some cases of chronic sinusitis, surgery  is needed. Generally, these are cases in which sinusitis recurs more than 3 times per year, despite other treatments. °HOME CARE INSTRUCTIONS °· Drink plenty of water. Water helps thin the mucus so your sinuses can drain more easily. °· Use a humidifier. °· Inhale steam 3-4 times a day (for example, sit in the bathroom with the shower running). °· Apply a warm, moist washcloth to your face 3-4 times a day, or as directed by your health care provider. °· Use saline nasal sprays to help moisten and clean your sinuses. °· Take medicines only as directed by your health care provider. °· If you were prescribed either an antibiotic or antifungal medicine, finish it all even if you start to feel better. °SEEK IMMEDIATE MEDICAL CARE IF: °· You have increasing pain or severe headaches. °· You have nausea, vomiting, or drowsiness. °· You have swelling around your face. °· You have vision problems. °· You have a stiff neck. °· You have difficulty breathing. °  °This information is not intended to replace advice given to you by your health care provider. Make sure you discuss any questions you have with your health care provider. °  °Document Released: 12/24/2004 Document Revised: 01/14/2014 Document Reviewed: 01/08/2011 °Elsevier Interactive Patient Education ©2016 [employer] ° °

## 2014-12-19 NOTE — ED Notes (Signed)
Pt verbalizes understanding of d/c instructions and denies any further needs at this time. 

## 2014-12-19 NOTE — ED Provider Notes (Signed)
CSN: [id number]     Arrival date &[id number]/12/16  1733 History   By signing my name below, I, Dana Morrow, attest that this documentation has been prepared under the direction and in the presence of Dana Nay, MD. Electronically Signed: Gonzella Morrow, Scribe. 12/19/2014. 7:23 PM.    Chief Complaint  Patient presents with  . Influenza   The history is provided by the patient. No language interpreter was used.   HPI Comments: Dana Morrow is a 49 y.o. female who presents to the Emergency Department complaining of gradually worsening chest pain and congestion onset two days ago. She also complains of nausea, loss of appetite, a measured fever of 100, and four or five episodes of diarrhea today. Pt states she reported to the ER two days ago with HTN and chest pain but notes that she was discharged with no medication after her x-rays were found to be unremarkable. Pt was advised to use vapor rub and nasal spray which have provided her with no relief. She notes that she takes dilaudid three times a day as well as claudatin and phenergan daily. She denies productive cough and vomiting. She also denies a hx of heart disease. Pt notes that she smokes six or seven cigarettes per day but has only smoked a few since her visit to the ED two days ago.  Past Medical History  Diagnosis Date  . RSD (reflex sympathetic dystrophy)     a. L shoulder--pain mgmt with [facility] in [place] and/or w/s.  . Migraine   . Colon polyps     a. s/p colonoscopy and polypectomy in 2003  . Abdominal pain, chronic, left lower quadrant     a. s/p Laproscopy in 2004 w/o finding of source of discomfort.  [name] GERD (gastroesophageal reflux disease)   . Hiatal hernia     a. small by EGD 2006.  [name] Visual disturbance     a. 12/2011 Left eye visual changes->w/u for TIA->Carotid U/S:  No ICA stenosis; Head CT: No acute abnl; MRI/MRA: mild RPCA stenosis; Echo: EF 55-60%, Triv AI, No PFO.  . Tobacco  abuse     a. 50 pack year hx.  . Cholelithiasis     a. s/p cholecystectomy 10/2011  . Chest pain     a. reportedly nl MV and echo 10/2011 - [place]  . Diverticulosis     with ? of 'itis  . Hypertension   . Family history of colon cancer     mother  . History of amaurosis fugax 01/2011    L eye; saw neuro at [facility] (Dr. Johnell Comings).  Lab w/u and carotid doppler nl per old records.  . Anxiety and depression 2013    Lots of stress  . Hyperlipidemia     LDL 191 in 2013 per old records   Past Surgical History  Procedure Laterality Date  . Cesarean section  1990  . Shoulder surgery Left 1997 &98  . Laparoscopic cholecystectomy  10/19/11  . Umbilical hernia repair  2014    with mesh  . Laparoscopy  05/2002    Dr. Harmon Dun cause for LLQ abd pain found  . Endometrial ablation  2011  . Colonoscopy  10/2001    Dr. Ewing Schlein; diverticulosis, int/ext hem, one small polyp removed.  . Esophagogastroduodenoscopy  02/2004    Dr. Ewing Schlein; normal   Family History  Problem Relation Age of Onset  . Dementia Father   . Parkinson's disease Father  d. 2013  . Hypertension Father   . Hyperlipidemia Father   . Heart disease Father   . Heart attack Mother     died @ 6367  . Colon cancer Mother   . Hyperlipidemia Mother   . Heart disease Mother   . Hypertension Mother   . Esophageal cancer Brother   . Diabetes Neg Hx    Social History  Substance Use Topics  . Smoking status: Current Every Day Smoker -- 0.50 packs/day for 30 years    Types: Cigarettes  . Smokeless tobacco: Never Used     Comment: Smoked 2ppd for roughly 25 yrs but cut back to 1/2 ppd about 6 mos ago.  . Alcohol Use: No   OB History    No data available     Review of Systems  A complete 10 system review of systems was obtained and all systems are negative except as noted in the HPI and PMH.    Allergies  Flagyl; Elavil; Klonopin; and Levaquin  Home Medications   Prior  to Admission medications   Medication Sig Start Date End Date Taking? Authorizing Provider  cloNIDine (CATAPRES) 0.1 MG tablet Take 1 tablet (0.1 mg total) by mouth 3 (three) times daily. 11/24/14   Jeoffrey MassedPhilip H McGowen, MD  doxycycline (VIBRA-TABS) 100 MG tablet Take 1 tablet (100 mg total) by mouth 2 (two) times daily. 12/19/14   Dana Nayobert Alandra Sando, MD  gabapentin (NEURONTIN) 100 MG capsule 1 tab po tid x 7d, then 2 tabs po tid, then 3 tabs po tid 09/05/14   Jeoffrey MassedPhilip H McGowen, MD  HYDROmorphone (DILAUDID) 2 MG tablet Take by mouth 3 (three) times daily.    Historical Provider, MD  irbesartan-hydrochlorothiazide (AVALIDE) 150-12.5 MG per tablet Take 1 tablet by mouth daily. 09/05/14   Jeoffrey MassedPhilip H McGowen, MD  metoprolol succinate (TOPROL-XL) 100 MG 24 hr tablet Take 100 mg by mouth daily. Take with or immediately following a meal.    Historical Provider, MD  ondansetron (ZOFRAN ODT) 4 MG disintegrating tablet Take 1 tablet (4 mg total) by mouth every 8 (eight) hours as needed for nausea. 08/25/14   Rolland PorterMark James, MD  promethazine (PHENERGAN) 25 MG tablet Take 25 mg by mouth every 6 (six) hours as needed for nausea or vomiting.    Historical Provider, MD   BP 143/91 mmHg  Pulse 99  Temp(Src) 97.6 F (36.4 C) (Oral)  Resp 18  Ht 5\' 3"  (1.6 m)  Wt 172 lb (78.019 kg)  BMI 30.48 kg/m2  SpO2 95% Physical Exam  Constitutional: She is oriented to person, place, and time. She appears well-developed and well-nourished. No distress.  HENT:  Head: Normocephalic and atraumatic.  Nose: Mucosal edema present. Right sinus exhibits maxillary sinus tenderness. Left sinus exhibits maxillary sinus tenderness.  Eyes: Pupils are equal, round, and reactive to light.  Neck: Normal range of motion.  Cardiovascular: Intact distal pulses.  Tachycardia present.   Pulmonary/Chest: No respiratory distress.  Abdominal: Normal appearance. She exhibits no distension.  Musculoskeletal: Normal range of motion.  Neurological: She is alert  and oriented to person, place, and time. No cranial nerve deficit.  Skin: Skin is warm and dry. No rash noted.  Psychiatric: She has a normal mood and affect. Her behavior is normal.  Nursing note and vitals reviewed.   ED Course  Procedures   Medications  doxycycline (VIBRA-TABS) tablet 100 mg (not administered)  sodium chloride 0.9 % bolus 1,000 mL (0 mLs Intravenous Stopped 12/19/14 2035)  ondansetron (ZOFRAN)  injection 4 mg (4 mg Intravenous Given 12/19/14 1941)  HYDROmorphone (DILAUDID) injection 1 mg (1 mg Intravenous Given 12/19/14 1942)  cloNIDine (CATAPRES) tablet 0.1 mg (0.1 mg Oral Given 12/19/14 2112)  HYDROmorphone (DILAUDID) injection 0.5 mg (0.5 mg Intravenous Given 12/19/14 2112)  sodium chloride 0.9 % bolus 500 mL (500 mLs Intravenous New Bag/Given 12/19/14 2112)    DIAGNOSTIC STUDIES:    Oxygen Saturation is 100% on RA, normal by my interpretation.   COORDINATION OF CARE:  7:17 PM Will begin an IV on pt in the ED. Will repeat blood work and x-ray. Will administer pain and nausea medication in the ED. Discussed treatment plan with pt at bedside and pt agreed to plan.    Labs Review Labs Reviewed  CBC WITH DIFFERENTIAL/PLATELET - Abnormal; Notable for the following:    WBC 12.2 (*)    Hemoglobin 15.1 (*)    Neutro Abs 7.8 (*)    Monocytes Absolute 1.2 (*)    All other components within normal limits  COMPREHENSIVE METABOLIC PANEL - Abnormal; Notable for the following:    Glucose, Bld 114 (*)    All other components within normal limits  TROPONIN I  TROPONIN I  TROPONIN I  I-STAT CG4 LACTIC ACID, ED    . Date: 12/19/2014  Rate: 110  Rhythm: Sinus tachycardia  QRS Axis: normal  Intervals: normal  ST/T Wave abnormalities: normal  Conduction Disutrbances: none  Narrative Interpretation: Abnormal EKG      After treatment in the ED the patient feels back to baseline and wants to go home.  MDM   Final diagnoses:  Acute frontal sinusitis,  recurrence not specified  Dehydration    I personally performed the services described in this documentation, which was scribed in my presence. The recorded information has been reviewed and considered.    Dana Nay, MD 12/19/14 2229

## 2014-12-19 NOTE — ED Notes (Signed)
C/o cont'd body aches, minimal cough, feeling worse-NAD-dx with "flu or a virus this weekened here"

## 2014-12-19 NOTE — ED Notes (Signed)
MD at bedside. 

## 2014-12-22 DIAGNOSIS — G8929 Other chronic pain: Secondary | ICD-10-CM | POA: Diagnosis not present

## 2014-12-22 DIAGNOSIS — Z79891 Long term (current) use of opiate analgesic: Secondary | ICD-10-CM | POA: Diagnosis not present

## 2014-12-22 DIAGNOSIS — M47816 Spondylosis without myelopathy or radiculopathy, lumbar region: Secondary | ICD-10-CM | POA: Diagnosis not present

## 2014-12-22 DIAGNOSIS — G90519 Complex regional pain syndrome I of unspecified upper limb: Secondary | ICD-10-CM | POA: Diagnosis not present

## 2014-12-28 DIAGNOSIS — Z79891 Long term (current) use of opiate analgesic: Secondary | ICD-10-CM | POA: Diagnosis not present

## 2014-12-28 DIAGNOSIS — F329 Major depressive disorder, single episode, unspecified: Secondary | ICD-10-CM | POA: Diagnosis not present

## 2014-12-28 DIAGNOSIS — Z79899 Other long term (current) drug therapy: Secondary | ICD-10-CM | POA: Diagnosis not present

## 2014-12-28 DIAGNOSIS — M47816 Spondylosis without myelopathy or radiculopathy, lumbar region: Secondary | ICD-10-CM | POA: Diagnosis not present

## 2014-12-28 DIAGNOSIS — G8929 Other chronic pain: Secondary | ICD-10-CM | POA: Diagnosis not present

## 2015-01-04 DIAGNOSIS — R109 Unspecified abdominal pain: Secondary | ICD-10-CM | POA: Diagnosis not present

## 2015-01-04 DIAGNOSIS — Z79899 Other long term (current) drug therapy: Secondary | ICD-10-CM | POA: Diagnosis not present

## 2015-01-04 DIAGNOSIS — Z87442 Personal history of urinary calculi: Secondary | ICD-10-CM | POA: Diagnosis not present

## 2015-01-04 DIAGNOSIS — G90512 Complex regional pain syndrome I of left upper limb: Secondary | ICD-10-CM | POA: Diagnosis not present

## 2015-01-04 DIAGNOSIS — Z881 Allergy status to other antibiotic agents status: Secondary | ICD-10-CM | POA: Diagnosis not present

## 2015-01-04 DIAGNOSIS — Z79891 Long term (current) use of opiate analgesic: Secondary | ICD-10-CM | POA: Diagnosis not present

## 2015-01-04 DIAGNOSIS — R1084 Generalized abdominal pain: Secondary | ICD-10-CM | POA: Diagnosis not present

## 2015-01-04 DIAGNOSIS — F17210 Nicotine dependence, cigarettes, uncomplicated: Secondary | ICD-10-CM | POA: Diagnosis not present

## 2015-01-04 DIAGNOSIS — G8929 Other chronic pain: Secondary | ICD-10-CM | POA: Diagnosis not present

## 2015-01-04 DIAGNOSIS — Z888 Allergy status to other drugs, medicaments and biological substances status: Secondary | ICD-10-CM | POA: Diagnosis not present

## 2015-01-04 DIAGNOSIS — F419 Anxiety disorder, unspecified: Secondary | ICD-10-CM | POA: Diagnosis not present

## 2015-01-04 DIAGNOSIS — I10 Essential (primary) hypertension: Secondary | ICD-10-CM | POA: Diagnosis not present

## 2015-01-17 ENCOUNTER — Telehealth: Payer: Self-pay | Admitting: Family Medicine

## 2015-01-17 NOTE — Telephone Encounter (Signed)
Left message for pt to call back  °

## 2015-01-17 NOTE — Telephone Encounter (Signed)
Patient is out of town. Has made an appt for when she gets back. Her boyfriend can pick up Rx for blood pressure meds & bring it to her. Can she get enough to make it to her appt?

## 2015-01-18 MED ORDER — CLONIDINE HCL 0.1 MG PO TABS: 0.1000 mg | ORAL_TABLET | Freq: Three times a day (TID) | ORAL | Status: DC

## 2015-01-18 NOTE — Telephone Encounter (Signed)
Spoke to pt and she stated that she needs a RF for the clonidine.  RF request for clonidine LOV: 09/05/14 Next ov: 01/30/15 Last written: 11/24/14 #90 w/ 0RF  Rx sent for #90 w/ 0RF. Pt advised and voiced understanding.

## 2015-01-30 ENCOUNTER — Ambulatory Visit: Payer: Medicare Other | Admitting: Family Medicine

## 2015-02-06 ENCOUNTER — Ambulatory Visit: Payer: Medicare Other

## 2015-02-07 DIAGNOSIS — Z8711 Personal history of peptic ulcer disease: Secondary | ICD-10-CM | POA: Diagnosis not present

## 2015-02-07 DIAGNOSIS — F17210 Nicotine dependence, cigarettes, uncomplicated: Secondary | ICD-10-CM | POA: Diagnosis not present

## 2015-02-07 DIAGNOSIS — I10 Essential (primary) hypertension: Secondary | ICD-10-CM | POA: Diagnosis not present

## 2015-02-07 DIAGNOSIS — K5730 Diverticulosis of large intestine without perforation or abscess without bleeding: Secondary | ICD-10-CM | POA: Diagnosis not present

## 2015-02-07 DIAGNOSIS — G90512 Complex regional pain syndrome I of left upper limb: Secondary | ICD-10-CM | POA: Diagnosis not present

## 2015-02-07 DIAGNOSIS — R1032 Left lower quadrant pain: Secondary | ICD-10-CM | POA: Diagnosis not present

## 2015-02-07 DIAGNOSIS — Z888 Allergy status to other drugs, medicaments and biological substances status: Secondary | ICD-10-CM | POA: Diagnosis not present

## 2015-02-07 DIAGNOSIS — G8929 Other chronic pain: Secondary | ICD-10-CM | POA: Diagnosis not present

## 2015-02-07 DIAGNOSIS — E785 Hyperlipidemia, unspecified: Secondary | ICD-10-CM | POA: Diagnosis not present

## 2015-02-07 DIAGNOSIS — K8689 Other specified diseases of pancreas: Secondary | ICD-10-CM | POA: Diagnosis not present

## 2015-02-07 DIAGNOSIS — Z800 Family history of malignant neoplasm of digestive organs: Secondary | ICD-10-CM | POA: Diagnosis not present

## 2015-02-07 DIAGNOSIS — Z79899 Other long term (current) drug therapy: Secondary | ICD-10-CM | POA: Diagnosis not present

## 2015-02-07 DIAGNOSIS — K5790 Diverticulosis of intestine, part unspecified, without perforation or abscess without bleeding: Secondary | ICD-10-CM | POA: Diagnosis not present

## 2015-02-07 DIAGNOSIS — F419 Anxiety disorder, unspecified: Secondary | ICD-10-CM | POA: Diagnosis not present

## 2015-02-07 DIAGNOSIS — G894 Chronic pain syndrome: Secondary | ICD-10-CM | POA: Diagnosis not present

## 2015-02-07 DIAGNOSIS — Z79891 Long term (current) use of opiate analgesic: Secondary | ICD-10-CM | POA: Diagnosis not present

## 2015-02-07 DIAGNOSIS — Z87442 Personal history of urinary calculi: Secondary | ICD-10-CM | POA: Diagnosis not present

## 2015-02-09 DIAGNOSIS — Z79891 Long term (current) use of opiate analgesic: Secondary | ICD-10-CM | POA: Diagnosis not present

## 2015-02-20 ENCOUNTER — Other Ambulatory Visit: Payer: Self-pay | Admitting: *Deleted

## 2015-02-20 MED ORDER — CLONIDINE HCL 0.1 MG PO TABS: 0.1000 mg | ORAL_TABLET | Freq: Three times a day (TID) | ORAL | Status: DC

## 2015-02-20 NOTE — Telephone Encounter (Signed)
I am authorizing one more 30 d supply. If she does not come in by the time this med is finished then I have no choice but to deny any further RF requests.

## 2015-02-20 NOTE — Telephone Encounter (Signed)
Pt was advised on 01/18/15 that she would need ov for more refills. No showed on 01/30/15 and cancelled apt on 02/06/15.  RF request for clonidine LOV: 09/05/14 Next ov: None Last written: 01/18/15 #90 w/ 0RF  Please advise. Thanks.

## 2015-02-21 NOTE — Telephone Encounter (Signed)
Pt advised and voiced understanding.   

## 2015-02-22 DIAGNOSIS — G8929 Other chronic pain: Secondary | ICD-10-CM | POA: Diagnosis not present

## 2015-02-22 DIAGNOSIS — G90519 Complex regional pain syndrome I of unspecified upper limb: Secondary | ICD-10-CM | POA: Diagnosis not present

## 2015-02-22 DIAGNOSIS — Z79899 Other long term (current) drug therapy: Secondary | ICD-10-CM | POA: Diagnosis not present

## 2015-03-01 DIAGNOSIS — G90519 Complex regional pain syndrome I of unspecified upper limb: Secondary | ICD-10-CM | POA: Diagnosis not present

## 2015-03-01 DIAGNOSIS — G8929 Other chronic pain: Secondary | ICD-10-CM | POA: Diagnosis not present

## 2015-03-05 ENCOUNTER — Emergency Department (HOSPITAL_BASED_OUTPATIENT_CLINIC_OR_DEPARTMENT_OTHER): Payer: Medicare Other

## 2015-03-05 ENCOUNTER — Emergency Department (HOSPITAL_BASED_OUTPATIENT_CLINIC_OR_DEPARTMENT_OTHER)
Admission: EM | Admit: 2015-03-05 | Discharge: 2015-03-05 | Disposition: A | Discharge status: Home / Self Care | Payer: Medicare Other | Attending: Emergency Medicine | Admitting: Emergency Medicine

## 2015-03-05 ENCOUNTER — Encounter (HOSPITAL_BASED_OUTPATIENT_CLINIC_OR_DEPARTMENT_OTHER): Payer: Self-pay | Admitting: *Deleted

## 2015-03-05 DIAGNOSIS — Z8639 Personal history of other endocrine, nutritional and metabolic disease: Secondary | ICD-10-CM | POA: Insufficient documentation

## 2015-03-05 DIAGNOSIS — K5730 Diverticulosis of large intestine without perforation or abscess without bleeding: Secondary | ICD-10-CM | POA: Diagnosis not present

## 2015-03-05 DIAGNOSIS — Z8719 Personal history of other diseases of the digestive system: Secondary | ICD-10-CM | POA: Diagnosis not present

## 2015-03-05 DIAGNOSIS — Z79899 Other long term (current) drug therapy: Secondary | ICD-10-CM | POA: Insufficient documentation

## 2015-03-05 DIAGNOSIS — F329 Major depressive disorder, single episode, unspecified: Secondary | ICD-10-CM | POA: Diagnosis not present

## 2015-03-05 DIAGNOSIS — R112 Nausea with vomiting, unspecified: Secondary | ICD-10-CM | POA: Insufficient documentation

## 2015-03-05 DIAGNOSIS — I10 Essential (primary) hypertension: Secondary | ICD-10-CM | POA: Insufficient documentation

## 2015-03-05 DIAGNOSIS — Z86010 Personal history of colonic polyps: Secondary | ICD-10-CM | POA: Insufficient documentation

## 2015-03-05 DIAGNOSIS — G8929 Other chronic pain: Secondary | ICD-10-CM | POA: Insufficient documentation

## 2015-03-05 DIAGNOSIS — F419 Anxiety disorder, unspecified: Secondary | ICD-10-CM | POA: Diagnosis not present

## 2015-03-05 DIAGNOSIS — R1032 Left lower quadrant pain: Secondary | ICD-10-CM | POA: Diagnosis not present

## 2015-03-05 DIAGNOSIS — R000 Tachycardia, unspecified: Secondary | ICD-10-CM | POA: Insufficient documentation

## 2015-03-05 DIAGNOSIS — F17210 Nicotine dependence, cigarettes, uncomplicated: Secondary | ICD-10-CM | POA: Insufficient documentation

## 2015-03-05 LAB — URINALYSIS, ROUTINE W REFLEX MICROSCOPIC
Glucose, UA: NEGATIVE mg/dL
Hgb urine dipstick: NEGATIVE
Ketones, ur: 40 mg/dL — AB
Leukocytes, UA: NEGATIVE
NITRITE: NEGATIVE
PH: 6.5 (ref 5.0–8.0)
Protein, ur: NEGATIVE mg/dL
SPECIFIC GRAVITY, URINE: 1.022 (ref 1.005–1.030)

## 2015-03-05 LAB — CBC
HEMATOCRIT: 44.4 % (ref 36.0–46.0)
HEMOGLOBIN: 15.8 g/dL — AB (ref 12.0–15.0)
MCH: 32.1 pg (ref 26.0–34.0)
MCHC: 35.6 g/dL (ref 30.0–36.0)
MCV: 90.2 fL (ref 78.0–100.0)
Platelets: 321 10*3/uL (ref 150–400)
RBC: 4.92 MIL/uL (ref 3.87–5.11)
RDW: 13.6 % (ref 11.5–15.5)
WBC: 15.9 10*3/uL — ABNORMAL HIGH (ref 4.0–10.5)

## 2015-03-05 LAB — COMPREHENSIVE METABOLIC PANEL
ALBUMIN: 4.2 g/dL (ref 3.5–5.0)
ALK PHOS: 90 U/L (ref 38–126)
ALT: 13 U/L — AB (ref 14–54)
AST: 15 U/L (ref 15–41)
Anion gap: 11 (ref 5–15)
BUN: 15 mg/dL (ref 6–20)
CALCIUM: 9.2 mg/dL (ref 8.9–10.3)
CO2: 24 mmol/L (ref 22–32)
CREATININE: 0.86 mg/dL (ref 0.44–1.00)
Chloride: 102 mmol/L (ref 101–111)
GFR calc non Af Amer: >60 mL/min (ref >60)
GLUCOSE: 132 mg/dL — AB (ref 65–99)
Potassium: 3.9 mmol/L (ref 3.5–5.1)
SODIUM: 137 mmol/L (ref 135–145)
Total Bilirubin: 0.7 mg/dL (ref 0.3–1.2)
Total Protein: 8.4 g/dL — ABNORMAL HIGH (ref 6.5–8.1)

## 2015-03-05 LAB — LIPASE, BLOOD: Lipase: 18 U/L (ref 11–51)

## 2015-03-05 MED ORDER — ONDANSETRON HCL 4 MG/2ML IJ SOLN
4.0000 mg | Freq: Once | INTRAMUSCULAR | Status: AC
  Administered 2015-03-05: 4 mg via INTRAVENOUS
  Filled 2015-03-05: qty 2

## 2015-03-05 MED ORDER — ACETAMINOPHEN 325 MG PO TABS
650.0000 mg | ORAL_TABLET | Freq: Once | ORAL | Status: AC
  Administered 2015-03-05: 650 mg via ORAL
  Filled 2015-03-05: qty 2

## 2015-03-05 MED ORDER — SODIUM CHLORIDE 0.9 % IV BOLUS (SEPSIS)
1000.0000 mL | Freq: Once | INTRAVENOUS | Status: AC
  Administered 2015-03-05: 1000 mL via INTRAVENOUS

## 2015-03-05 MED ORDER — IOHEXOL 300 MG/ML  SOLN
25.0000 mL | Freq: Once | INTRAMUSCULAR | Status: AC | PRN
  Administered 2015-03-05: 25 mL via ORAL

## 2015-03-05 MED ORDER — CLONIDINE HCL 0.1 MG PO TABS
0.1000 mg | ORAL_TABLET | Freq: Once | ORAL | Status: AC
  Administered 2015-03-05: 0.1 mg via ORAL
  Filled 2015-03-05: qty 1

## 2015-03-05 MED ORDER — IOHEXOL 300 MG/ML  SOLN
100.0000 mL | Freq: Once | INTRAMUSCULAR | Status: AC | PRN
  Administered 2015-03-05: 100 mL via INTRAVENOUS

## 2015-03-05 MED ORDER — ONDANSETRON 4 MG PO TBDP: 4.0000 mg | ORAL_TABLET | Freq: Three times a day (TID) | ORAL | Status: DC | PRN

## 2015-03-05 MED ORDER — HYDROMORPHONE HCL 1 MG/ML IJ SOLN
1.0000 mg | Freq: Once | INTRAMUSCULAR | Status: AC
  Administered 2015-03-05: 1 mg via INTRAVENOUS
  Filled 2015-03-05: qty 1

## 2015-03-05 NOTE — ED Notes (Signed)
N/V since this morning.  zofran x 3 PTA without relief.

## 2015-03-05 NOTE — Discharge Instructions (Signed)
Nausea and Vomiting °Nausea is a sick feeling that often comes before throwing up (vomiting). Vomiting is a reflex where stomach contents come out of your mouth. Vomiting can cause severe loss of body fluids (dehydration). Children and elderly adults can become dehydrated quickly, especially if they also have diarrhea. Nausea and vomiting are symptoms of a condition or disease. It is important to find the cause of your symptoms. °CAUSES  °· Direct irritation of the stomach lining. This irritation can result from increased acid production (gastroesophageal reflux disease), infection, food poisoning, taking certain medicines (such as nonsteroidal anti-inflammatory drugs), alcohol use, or tobacco use. °· Signals from the brain. These signals could be caused by a headache, heat exposure, an inner ear disturbance, increased pressure in the brain from injury, infection, a tumor, or a concussion, pain, emotional stimulus, or metabolic problems. °· An obstruction in the gastrointestinal tract (bowel obstruction). °· Illnesses such as diabetes, hepatitis, gallbladder problems, appendicitis, kidney problems, cancer, sepsis, atypical symptoms of a heart attack, or eating disorders. °· Medical treatments such as chemotherapy and radiation. °· Receiving medicine that makes you sleep (general anesthetic) during surgery. °DIAGNOSIS °Your caregiver may ask for tests to be done if the problems do not improve after a few days. Tests may also be done if symptoms are severe or if the reason for the nausea and vomiting is not clear. Tests may include: °· Urine tests. °· Blood tests. °· Stool tests. °· Cultures (to look for evidence of infection). °· X-rays or other imaging studies. °Test results can help your caregiver make decisions about treatment or the need for additional tests. °TREATMENT °You need to stay well hydrated. Drink frequently but in small amounts. You may wish to drink water, sports drinks, clear broth, or eat frozen  ice pops or gelatin dessert to help stay hydrated. When you eat, eating slowly may help prevent nausea. There are also some antinausea medicines that may help prevent nausea. °HOME CARE INSTRUCTIONS  °· Take all medicine as directed by your caregiver. °· If you do not have an appetite, do not force yourself to eat. However, you must continue to drink fluids. °· If you have an appetite, eat a normal diet unless your caregiver tells you differently. °· Eat a variety of complex carbohydrates (rice, wheat, potatoes, bread), lean meats, yogurt, fruits, and vegetables. °· Avoid high-fat foods because they are more difficult to digest. °· Drink enough water and fluids to keep your urine clear or pale yellow. °· If you are dehydrated, ask your caregiver for specific rehydration instructions. Signs of dehydration may include: °· Severe thirst. °· Dry lips and mouth. °· Dizziness. °· Dark urine. °· Decreasing urine frequency and amount. °· Confusion. °· Rapid breathing or pulse. °SEEK IMMEDIATE MEDICAL CARE IF:  °· You have blood or brown flecks (like coffee grounds) in your vomit. °· You have black or bloody stools. °· You have a severe headache or stiff neck. °· You are confused. °· You have severe abdominal pain. °· You have chest pain or trouble breathing. °· You do not urinate at least once every 8 hours. °· You develop cold or clammy skin. °· You continue to vomit for longer than 24 to 48 hours. °· You have a fever. °MAKE SURE YOU:  °· Understand these instructions. °· Will watch your condition. °· Will get help right away if you are not doing well or get worse. °  °This information is not intended to replace advice given to you by your health care provider. Make sure   you discuss any questions you have with your health care provider.   Document Released: 12/24/2004 Document Revised: 03/18/2011 Document Reviewed: 05/23/2010 Elsevier Interactive Patient Education 2016 [employer]  Nonspecific  Tachycardia Tachycardia is a faster than normal heartbeat (more than 100 beats per minute). In adults, the heart normally beats between 60 and 100 times a minute. A fast heartbeat may be a normal response to exercise or stress. It does not necessarily mean that something is wrong. However, sometimes when your heart beats too fast it may not be able to pump enough blood to the rest of your body. This can result in chest pain, shortness of breath, dizziness, and even fainting. Nonspecific tachycardia means that the specific cause or pattern of your tachycardia is unknown. CAUSES  Tachycardia may be harmless or it may be due to a more serious underlying cause. Possible causes of tachycardia include:  Exercise or exertion.  Fever.  Pain or injury.  Infection.  Loss of body fluids (dehydration).  Overactive thyroid.  Lack of red blood cells (anemia).  Anxiety and stress.  Alcohol.  Caffeine.  Tobacco products.  Diet pills.  Illegal drugs.  Heart disease. SYMPTOMS  Rapid or irregular heartbeat (palpitations).  Suddenly feeling your heart beating (cardiac awareness).  Dizziness.  Tiredness (fatigue).  Shortness of breath.  Chest pain.  Nausea.  Fainting. DIAGNOSIS  Your caregiver will perform a physical exam and take your medical history. In some cases, a heart specialist (cardiologist) may be consulted. Your caregiver may also order:  Blood tests.  Electrocardiography. This test records the electrical activity of your heart.  A heart monitoring test. TREATMENT  Treatment will depend on the likely cause of your tachycardia. The goal is to treat the underlying cause of your tachycardia. Treatment methods may include:  Replacement of fluids or blood through an intravenous (IV) tube for moderate to severe dehydration or anemia.  New medicines or changes in your current medicines.  Diet and lifestyle changes.  Treatment for certain infections.  Stress relief  or relaxation methods. HOME CARE INSTRUCTIONS   Rest.  Drink enough fluids to keep your urine clear or pale yellow.  Do not smoke.  Avoid:  Caffeine.  Tobacco.  Alcohol.  Chocolate.  Stimulants such as over-the-counter diet pills or pills that help you stay awake.  Situations that cause anxiety or stress.  Illegal drugs such as marijuana, phencyclidine (PCP), and cocaine.  Only take medicine as directed by your caregiver.  Keep all follow-up appointments as directed by your caregiver. SEEK IMMEDIATE MEDICAL CARE IF:   You have pain in your chest, upper arms, jaw, or neck.  You become weak, dizzy, or feel faint.  You have palpitations that will not go away.  You vomit, have diarrhea, or pass blood in your stool.  Your skin is cool, pale, and wet.  You have a fever that will not go away with rest, fluids, and medicine. MAKE SURE YOU:   Understand these instructions.  Will watch your condition.  Will get help right away if you are not doing well or get worse.   This information is not intended to replace advice given to you by your health care provider. Make sure you discuss any questions you have with your health care provider.   Document Released: 02/01/2004 Document Revised: 03/18/2011 Document Reviewed: 07/08/2014 Elsevier Interactive Patient Education [employer]

## 2015-03-05 NOTE — ED Provider Notes (Signed)
CSN: [id number]     Arrival date & time 03/05/15  1709 History  By signing my name below, I, Dana Morrow, attest that this documentation has been prepared under the direction and in the presence of No att. providers found. Electronically Signed: Linus Morrow, ED Scribe. 03/06/2015. 5:57 PM.   Chief Complaint  Patient presents with  . Emesis   The history is provided by the patient. No language interpreter was used.   HPI Comments: Dana Morrow is a 50 y.o. female with a PMHx of RSD, migraines, GERD, hiatal hernia, cholelithiasis, diverticulosis, HTN, HLD, and depression who presents to the Emergency Department complaining of vomiting "x 30-40" with associated nausea that began this morning, PTA. Pt also reports abdominal pain that began after her N/V. Pt denies any fevers, chills, SOB, or any other symptoms at this time. Pt denies any bad food exposure or sick contacts. Pt took zofran x3 with no relief. Pt took her 0.1mg  clonidine and  dilaudid at 11am, PTA and is due for her next dose at 5pm. Pt is on disability but works part time at an [employer] firm.   Past Medical History  Diagnosis Date  . RSD (reflex sympathetic dystrophy)     a. L shoulder--pain mgmt with [facility] in [place] and/or w/s.  . Migraine   . Colon polyps     a. s/p colonoscopy and polypectomy in 2003  . Abdominal pain, chronic, left lower quadrant     a. s/p Laproscopy in 2004 w/o finding of source of discomfort.  [name] GERD (gastroesophageal reflux disease)   . Hiatal hernia     a. small by EGD 2006.  [name] Visual disturbance     a. 12/2011 Left eye visual changes->w/u for TIA->Carotid U/S:  No ICA stenosis; Head CT: No acute abnl; MRI/MRA: mild RPCA stenosis; Echo: EF 55-60%, Triv AI, No PFO.  . Tobacco abuse     a. 50 pack year hx.  . Cholelithiasis     a. s/p cholecystectomy 10/2011  . Chest pain     a. reportedly nl MV and echo 10/2011 - [place]  . Diverticulosis     with ? of 'itis   . Hypertension   . Family history of colon cancer     mother  . History of amaurosis fugax 01/2011    L eye; saw neuro at [facility] (Dr. Johnell Comings).  Lab w/u and carotid doppler nl per old records.  . Anxiety and depression 2013    Lots of stress  . Hyperlipidemia     LDL 191 in 2013 per old records   Past Surgical History  Procedure Laterality Date  . Cesarean section  1990  . Shoulder surgery Left 1997 &98  . Laparoscopic cholecystectomy  10/19/11  . Umbilical hernia repair  2014    with mesh  . Laparoscopy  05/2002    Dr. Harmon Dun cause for LLQ abd pain found  . Endometrial ablation  2011  . Colonoscopy  10/2001    Dr. Ewing Schlein; diverticulosis, int/ext hem, one small polyp removed.  . Esophagogastroduodenoscopy  02/2004    Dr. Ewing Schlein; normal   Family History  Problem Relation Age of Onset  . Dementia Father   . Parkinson's disease Father     d. 2013  . Hypertension Father   . Hyperlipidemia Father   . Heart disease Father   . Heart attack Mother     died @ 73  . Colon cancer Mother   .  Hyperlipidemia Mother   . Heart disease Mother   . Hypertension Mother   . Esophageal cancer Brother   . Diabetes Neg Hx    Social History  Substance Use Topics  . Smoking status: Current Every Day Smoker -- 0.50 packs/day for 30 years    Types: Cigarettes  . Smokeless tobacco: Never Used     Comment: Smoked 2ppd for roughly 25 yrs but cut back to 1/2 ppd about 6 mos ago.  . Alcohol Use: No   OB History    No data available     Review of Systems  Constitutional: Negative for fever and chills.  HENT: Negative for congestion and rhinorrhea.   Eyes: Negative for redness and visual disturbance.  Respiratory: Negative for shortness of breath and wheezing.   Cardiovascular: Negative for chest pain and palpitations.  Gastrointestinal: Positive for nausea, vomiting and abdominal pain.  Genitourinary: Negative for dysuria and urgency.   Musculoskeletal: Negative for myalgias and arthralgias.  Skin: Negative for pallor and wound.  Neurological: Negative for dizziness and headaches.  All other systems reviewed and are negative.  Allergies  Flagyl; Elavil; Klonopin; and Levaquin  Home Medications   Prior to Admission medications   Medication Sig Start Date End Date Taking? Authorizing Provider  cloNIDine (CATAPRES) 0.1 MG tablet Take 1 tablet (0.1 mg total) by mouth 3 (three) times daily. 02/20/15   Jeoffrey Massed, MD  HYDROmorphone (DILAUDID) 2 MG tablet Take by mouth 3 (three) times daily.    Historical Provider, MD  ondansetron (ZOFRAN ODT) 4 MG disintegrating tablet Take 1 tablet (4 mg total) by mouth every 8 (eight) hours as needed for nausea. 03/05/15   Tilden Fossa, MD   BP 154/94 mmHg  Pulse 112  Temp(Src) 99.9 F (37.7 C) (Oral)  Resp 18  Ht  (1.6 m)  Wt 175 lb (79.379 kg)  BMI 31.01 kg/m2  SpO2 91%   Physical Exam  Constitutional: She is oriented to person, place, and time.  Uncomfortable appearing  HENT:  Head: Normocephalic and atraumatic.  Cardiovascular: Regular rhythm.  Tachycardia present.   No murmur heard. Pulmonary/Chest: Effort normal and breath sounds normal. No respiratory distress.  Abdominal: Soft. There is tenderness (mild LLQ). There is no rebound and no guarding.  Musculoskeletal: She exhibits no edema or tenderness.  Neurological: She is alert and oriented to person, place, and time.  Skin: Skin is warm and dry.  Psychiatric: She has a normal mood and affect. Her behavior is normal.  Nursing note and vitals reviewed.   ED Course  Procedures   DIAGNOSTIC STUDIES: Oxygen Saturation is 99% on room air, normal by my interpretation.    COORDINATION OF CARE: 5:43 PM Will order urinalysis EKG, and blood work. Will give fluids, zofran and dilaudid. Discussed treatment plan with pt at bedside and pt agreed to plan.  Labs Review Labs Reviewed  URINALYSIS, ROUTINE W REFLEX  MICROSCOPIC (NOT AT [facility]) - Abnormal; Notable for the following:    Bilirubin Urine SMALL (*)    Ketones, ur 40 (*)    All other components within normal limits  COMPREHENSIVE METABOLIC PANEL - Abnormal; Notable for the following:    Glucose, Bld 132 (*)    Total Protein 8.4 (*)    ALT 13 (*)    All other components within normal limits  CBC - Abnormal; Notable for the following:    WBC 15.9 (*)    Hemoglobin 15.8 (*)    All other components within normal  limits  LIPASE, BLOOD    Imaging Review Ct Abdomen Pelvis W Contrast  03/05/2015  CLINICAL DATA:  Nausea and vomiting EXAM: CT ABDOMEN AND PELVIS WITH CONTRAST TECHNIQUE: Multidetector CT imaging of the abdomen and pelvis was performed using the standard protocol following bolus administration of intravenous contrast. CONTRAST:  OMNIPAQUE IOHEXOL 300 MG/ML SOLN, 25mL OMNIPAQUE IOHEXOL 300 MG/ML SOLN COMPARISON:  09/06/2014 FINDINGS: The lung bases are free of acute infiltrate or sizable effusion. Minimal scarring is noted in the medial left lung base. Gallbladder is been surgically removed. The liver, spleen, adrenal glands and pancreas are within normal limits. Kidneys are well visualized bilaterally. No renal calculi or obstructive changes are noted. Normal excretion of contrast is seen. Diverticular change of the colon is noted without evidence of diverticulitis. The appendix is within normal limits. The bladder is well distended. No pelvic mass lesion is seen. The uterus is unremarkable. No obstructive changes are seen within the bowel. The osseous structures show chronic changes. IMPRESSION: Chronic changes without acute abnormality. Electronically Signed   By: Alcide Clever M.D.   On: 03/05/2015 20:55   I have personally reviewed and evaluated these images and lab results as part of my medical decision-making.   EKG Interpretation   Date/Time:  Sunday March 05 2015 17:21:24 EST Ventricular Rate:  122 PR Interval:  142 QRS  Duration: 74 QT Interval:  308 QTC Calculation: 438 R Axis:   58 Text Interpretation:  Sinus tachycardia Biatrial enlargement Abnormal ECG  Confirmed by Lincoln Brigham [phone]) on 03/05/2015 5:41:07 PM      MDM   Final diagnoses:  Non-intractable vomiting with nausea, vomiting of unspecified type  Sinus tachycardia (HCC)    Pt here with N/V, abdominal pain.  Pt markedly tachycardic on ED arrival that improved with IVF, pain and nausea control.  Pt with mild to moderate LLQ tenderness, leukoctosis.  CT scan obtained that was negative or acute diverticulitis.  On repeat eval in the department she is feeling significantly improved and tolerating oral fluids.  She states her heart rate does run fast at times.  Discussed outpatient follow up for recheck as well as home care and close return precautions.    I personally performed the services described in this documentation, which was scribed in my presence. The recorded information has been reviewed and is accurate.    Tilden Fossa, MD 03/06/15 1224

## 2015-03-05 NOTE — ED Notes (Signed)
Pt sipping on ginger ale successfully, family at bedside.

## 2015-03-08 DIAGNOSIS — G90519 Complex regional pain syndrome I of unspecified upper limb: Secondary | ICD-10-CM | POA: Diagnosis not present

## 2015-03-08 DIAGNOSIS — M5416 Radiculopathy, lumbar region: Secondary | ICD-10-CM | POA: Diagnosis not present

## 2015-03-08 DIAGNOSIS — G8929 Other chronic pain: Secondary | ICD-10-CM | POA: Diagnosis not present

## 2015-03-16 ENCOUNTER — Encounter: Payer: Self-pay | Admitting: Family Medicine

## 2015-03-16 ENCOUNTER — Ambulatory Visit (INDEPENDENT_AMBULATORY_CARE_PROVIDER_SITE_OTHER): Payer: Medicare Other | Admitting: Family Medicine

## 2015-03-16 VITALS — BP 123/86 | HR 102 | Temp 97.9°F | Ht 63.0 in | Wt 188.8 lb

## 2015-03-16 DIAGNOSIS — R0789 Other chest pain: Secondary | ICD-10-CM | POA: Diagnosis not present

## 2015-03-16 DIAGNOSIS — B349 Viral infection, unspecified: Secondary | ICD-10-CM

## 2015-03-16 DIAGNOSIS — R509 Fever, unspecified: Secondary | ICD-10-CM | POA: Diagnosis not present

## 2015-03-16 DIAGNOSIS — I10 Essential (primary) hypertension: Secondary | ICD-10-CM

## 2015-03-16 DIAGNOSIS — M940 Chondrocostal junction syndrome [Tietze]: Secondary | ICD-10-CM | POA: Diagnosis not present

## 2015-03-16 MED ORDER — PREDNISONE 20 MG PO TABS: 20.0000 mg | ORAL_TABLET | Freq: Every day | ORAL | Status: DC

## 2015-03-16 MED ORDER — CLONIDINE HCL 0.2 MG PO TABS: ORAL_TABLET | ORAL | Status: DC

## 2015-03-16 NOTE — Progress Notes (Signed)
OFFICE VISIT  03/16/2015   CC:  Chief Complaint  Patient presents with  . Follow-up    for b/p   HPI:    Patient is a 50 y.o. Caucasian female who presents for f/u HTN. Had recent ED visit 03/06/15 for vomiting and diarrhea, felt improved with  IVF, CT abd/pelv no acute problem. I have not seen her since 09/05/14, when she was new to my practice. She gets 150-160 over 90s, HR usually around 90.  She smokes about 1/2 pack of marlboro lights per day. No otc decongestants.  Drinks very little caffeine.  Diet: not low sodium that she knows of.  Exercise: walks 1 mile 3-4 times per week.  She has a stellate ganglion block scheduled with her chronic pain specialist, Dr. Marlynn Perking, at [facility] [place]  Last 4d has been fatigued, feeling febrile (with temp measured to 102 each night this week).  ST initialy then this resolved.  No signif cough.  Vomiting gone since last week's ED visit.  Chest hurting--feels tight and like she can't breath sometimes.  This occurs while at rest but is made worse by movement.  Poor PO intake--ginger ale and apple juice.  No diarrhea.  Taking zofran daily.  Past Medical History  Diagnosis Date  . RSD (reflex sympathetic dystrophy)     a. L shoulder--pain mgmt with Dr. Marlynn Perking at [facility] in [place]  . Migraine   . Colon polyps     a. s/p colonoscopy and polypectomy in 2003  . Abdominal pain, chronic, left lower quadrant     a. s/p Laproscopy in 2004 w/o finding of source of discomfort.  [name] GERD (gastroesophageal reflux disease)   . Hiatal hernia     a. small by EGD 2006.  [name] Visual disturbance     a. 12/2011 Left eye visual changes->w/u for TIA->Carotid U/S:  No ICA stenosis; Head CT: No acute abnl; MRI/MRA: mild RPCA stenosis; Echo: EF 55-60%, Triv AI, No PFO.  . Tobacco abuse     a. 50 pack year hx.  . Cholelithiasis     a. s/p cholecystectomy 10/2011  . Chest pain     a. reportedly nl MV and echo 10/2011 - [place]  .  Diverticulosis     with ? of 'itis  . Hypertension   . Family history of colon cancer     mother  . History of amaurosis fugax 01/2011    L eye; saw neuro at [facility] (Dr. Johnell Comings).  Lab w/u and carotid doppler nl per old records.  . Anxiety and depression 2013    Lots of stress  . Hyperlipidemia     LDL 191 in 2013 per old records    Past Surgical History  Procedure Laterality Date  . Cesarean section  1990  . Shoulder surgery Left 1997 &98  . Laparoscopic cholecystectomy  10/19/11  . Umbilical hernia repair  2014    with mesh  . Laparoscopy  05/2002    Dr. Harmon Dun cause for LLQ abd pain found  . Endometrial ablation  2011  . Colonoscopy  10/2001    Dr. Ewing Schlein; diverticulosis, int/ext hem, one small polyp removed.  . Esophagogastroduodenoscopy  02/2004    Dr. Ewing Schlein; normal    Outpatient Prescriptions Prior to Visit  Medication Sig Dispense Refill  . HYDROmorphone (DILAUDID) 2 MG tablet Take by mouth 3 (three) times daily.    . ondansetron (ZOFRAN ODT) 4 MG disintegrating tablet Take 1 tablet (4  mg total) by mouth every 8 (eight) hours as needed for nausea. 12 tablet 0  . cloNIDine (CATAPRES) 0.1 MG tablet Take 1 tablet (0.1 mg total) by mouth 3 (three) times daily. 90 tablet 0   No facility-administered medications prior to visit.    Allergies  Allergen Reactions  . Flagyl [Metronidazole] Nausea And Vomiting    Oral form only, IV pt can tolerate  . Elavil [Amitriptyline]     hallucinations  . Klonopin [Clonazepam]     Hallucinations   . Levaquin [Levofloxacin Hemihydrate] Hives    ROS As per HPI  PE: Blood pressure 123/86, pulse 102, temperature 97.9 F (36.6 C), temperature source Oral, height 5\' 3"  (1.6 m), weight 188 lb 12.8 oz (85.639 kg), SpO2 96 %.  Pt examined with Judie GrieveSuzanne Williams, nurse, as chaperone.  Gen: Alert, well appearing.  Patient is oriented to person, place, time, and situation. ZOX:WRUEENT:Eyes: no  injection, icteris, swelling, or exudate.  EOMI, PERRLA. Mouth: lips without lesion/swelling.  Oral mucosa pink and moist. Oropharynx without erythema, exudate, or swelling.  CV: RRR, no m/r/g.   LUNGS: CTA bilat, nonlabored resps, good aeration in all lung fields. Chest wall TTP along costochondral junction in midline diffusely EXT: no clubbing, cyanosis, or edema.   LABS:    Chemistry      Component Value Date/Time   NA 137 03/05/2015 1740   K 3.9 03/05/2015 1740   CL 102 03/05/2015 1740   CO2 24 03/05/2015 1740   BUN 15 03/05/2015 1740   CREATININE 0.86 03/05/2015 1740      Component Value Date/Time   CALCIUM 9.2 03/05/2015 1740   ALKPHOS 90 03/05/2015 1740   AST 15 03/05/2015 1740   ALT 13* 03/05/2015 1740   BILITOT 0.7 03/05/2015 1740     Lab Results  Component Value Date   WBC 15.9* 03/05/2015   HGB 15.8* 03/05/2015   HCT 44.4 03/05/2015   MCV 90.2 03/05/2015   PLT 321 03/05/2015   IMPRESSION AND PLAN:  1) Viral syndrome, suspect costochondritis. No sign of ACS or pericarditis/myocarditis on exam. Will check CBC w/diff and CXR today for completeness. Rx'd prednisone 20mg , 1 tab po qd x 5d.  2) HTN: not ideally controlled. Will increase clonidine to 0.2mg  tab, take 1 tab tid.  Spent 25 min with pt today, with >50% of this time spent in counseling and care coordination regarding the above problems.  An After Visit Summary was printed and given to the patient.  FOLLOW UP: Return in about 2 weeks (around 03/30/2015) for f/u HTN.

## 2015-03-18 ENCOUNTER — Encounter (HOSPITAL_BASED_OUTPATIENT_CLINIC_OR_DEPARTMENT_OTHER): Payer: Self-pay | Admitting: Emergency Medicine

## 2015-03-18 ENCOUNTER — Other Ambulatory Visit: Payer: Self-pay

## 2015-03-18 ENCOUNTER — Emergency Department (HOSPITAL_BASED_OUTPATIENT_CLINIC_OR_DEPARTMENT_OTHER): Payer: Medicare Other

## 2015-03-18 DIAGNOSIS — Z8639 Personal history of other endocrine, nutritional and metabolic disease: Secondary | ICD-10-CM | POA: Diagnosis not present

## 2015-03-18 DIAGNOSIS — Z79891 Long term (current) use of opiate analgesic: Secondary | ICD-10-CM | POA: Insufficient documentation

## 2015-03-18 DIAGNOSIS — I10 Essential (primary) hypertension: Secondary | ICD-10-CM | POA: Insufficient documentation

## 2015-03-18 DIAGNOSIS — R0789 Other chest pain: Secondary | ICD-10-CM | POA: Diagnosis not present

## 2015-03-18 DIAGNOSIS — Z8659 Personal history of other mental and behavioral disorders: Secondary | ICD-10-CM | POA: Insufficient documentation

## 2015-03-18 DIAGNOSIS — G8929 Other chronic pain: Secondary | ICD-10-CM | POA: Insufficient documentation

## 2015-03-18 DIAGNOSIS — Z79899 Other long term (current) drug therapy: Secondary | ICD-10-CM | POA: Diagnosis not present

## 2015-03-18 DIAGNOSIS — Z86010 Personal history of colonic polyps: Secondary | ICD-10-CM | POA: Diagnosis not present

## 2015-03-18 DIAGNOSIS — R079 Chest pain, unspecified: Secondary | ICD-10-CM | POA: Diagnosis present

## 2015-03-18 DIAGNOSIS — Z7952 Long term (current) use of systemic steroids: Secondary | ICD-10-CM | POA: Diagnosis not present

## 2015-03-18 DIAGNOSIS — J069 Acute upper respiratory infection, unspecified: Secondary | ICD-10-CM | POA: Diagnosis not present

## 2015-03-18 DIAGNOSIS — Z8719 Personal history of other diseases of the digestive system: Secondary | ICD-10-CM | POA: Diagnosis not present

## 2015-03-18 DIAGNOSIS — F17210 Nicotine dependence, cigarettes, uncomplicated: Secondary | ICD-10-CM | POA: Insufficient documentation

## 2015-03-18 DIAGNOSIS — R05 Cough: Secondary | ICD-10-CM | POA: Diagnosis not present

## 2015-03-18 LAB — BASIC METABOLIC PANEL
Anion gap: 10 (ref 5–15)
BUN: 20 mg/dL (ref 6–20)
CALCIUM: 8.6 mg/dL — AB (ref 8.9–10.3)
CHLORIDE: 103 mmol/L (ref 101–111)
CO2: 23 mmol/L (ref 22–32)
CREATININE: 0.71 mg/dL (ref 0.44–1.00)
Glucose, Bld: 102 mg/dL — ABNORMAL HIGH (ref 65–99)
Potassium: 4.2 mmol/L (ref 3.5–5.1)
SODIUM: 136 mmol/L (ref 135–145)

## 2015-03-18 LAB — TROPONIN I

## 2015-03-18 LAB — CBC
HCT: 41.7 % (ref 36.0–46.0)
Hemoglobin: 14.1 g/dL (ref 12.0–15.0)
MCH: 31.2 pg (ref 26.0–34.0)
MCHC: 33.8 g/dL (ref 30.0–36.0)
MCV: 92.3 fL (ref 78.0–100.0)
PLATELETS: 265 10*3/uL (ref 150–400)
RBC: 4.52 MIL/uL (ref 3.87–5.11)
RDW: 13.7 % (ref 11.5–15.5)
WBC: 7.4 10*3/uL (ref 4.0–10.5)

## 2015-03-18 NOTE — ED Notes (Signed)
Patient states that she was here about 2 weeks ago for the stomach flu, cough and cold. The patient reports that she was having mid to central chest pain at that time. The patient reports that she also has followed up with her PCP and was told to come have a chest x-ray and lab work done on Friday. She was unable to come, tonight the pain that has been constant continues and she reports that she was able to get a a ride here.

## 2015-03-19 ENCOUNTER — Emergency Department (HOSPITAL_BASED_OUTPATIENT_CLINIC_OR_DEPARTMENT_OTHER)
Admission: EM | Admit: 2015-03-19 | Discharge: 2015-03-19 | Disposition: A | Discharge status: Home / Self Care | Payer: Medicare Other | Attending: Emergency Medicine | Admitting: Emergency Medicine

## 2015-03-19 DIAGNOSIS — R0789 Other chest pain: Secondary | ICD-10-CM

## 2015-03-19 DIAGNOSIS — J069 Acute upper respiratory infection, unspecified: Secondary | ICD-10-CM

## 2015-03-19 LAB — TROPONIN I

## 2015-03-19 MED ORDER — ALBUTEROL SULFATE HFA 108 (90 BASE) MCG/ACT IN AERS: 2.0000 | INHALATION_SPRAY | Freq: Four times a day (QID) | RESPIRATORY_TRACT | Status: DC | PRN

## 2015-03-19 MED ORDER — HYDROCODONE-ACETAMINOPHEN 5-325 MG PO TABS
1.0000 | ORAL_TABLET | Freq: Once | ORAL | Status: AC
  Administered 2015-03-19: 1 via ORAL
  Filled 2015-03-19: qty 1

## 2015-03-19 MED ORDER — SODIUM CHLORIDE 0.9 % IV BOLUS (SEPSIS)
1000.0000 mL | Freq: Once | INTRAVENOUS | Status: AC
  Administered 2015-03-19: 1000 mL via INTRAVENOUS

## 2015-03-19 MED ORDER — ALBUTEROL SULFATE HFA 108 (90 BASE) MCG/ACT IN AERS
2.0000 | INHALATION_SPRAY | RESPIRATORY_TRACT | Status: DC | PRN
  Administered 2015-03-19: 2 via RESPIRATORY_TRACT
  Filled 2015-03-19: qty 6.7

## 2015-03-19 MED ORDER — KETOROLAC TROMETHAMINE 30 MG/ML IJ SOLN
30.0000 mg | Freq: Once | INTRAMUSCULAR | Status: AC
  Administered 2015-03-19: 30 mg via INTRAVENOUS
  Filled 2015-03-19: qty 1

## 2015-03-19 MED ORDER — HYDROCODONE-ACETAMINOPHEN 5-325 MG PO TABS: 1.0000 | ORAL_TABLET | Freq: Four times a day (QID) | ORAL | Status: DC | PRN

## 2015-03-19 NOTE — ED Notes (Signed)
Pt ambulated around dept SATs where 97% constant.

## 2015-03-19 NOTE — ED Provider Notes (Signed)
CSN: [id number]     Arrival date & time 03/18/15  2224 History   First MD Initiated Contact with Patient 03/19/15 0145     Chief Complaint  Patient presents with  . Chest Pain     (Consider location/radiation/quality/duration/timing/severity/associated sxs/prior Treatment) HPI  This is a 50 year old female who presents with chest pain. Patient states one week history of mid central chest pain which has been constant and ongoing. She states that 2 weeks ago she was seen for a viral illness. At that time she had vomiting and diarrhea as well as a cough. She denies fevers but does endorse chills. Patient states that she was seen by her primary physician and instructed to get an x-ray and lab work. She has history of chronic pain but states that the pain medicine that she takes is not helping. Cough is nonproductive. She is a smoker. Current pain is 7 out of 10.  Past Medical History  Diagnosis Date  . RSD (reflex sympathetic dystrophy)     a. L shoulder--pain mgmt with Dr. Marlynn PerkingMalloy at [facility] [place]  . Migraine   . Colon polyps     a. s/p colonoscopy and polypectomy in 2003  . Abdominal pain, chronic, left lower quadrant     a. s/p Laproscopy in 2004 w/o finding of source of discomfort.  [name]. GERD (gastroesophageal reflux disease)   . Hiatal hernia     a. small by EGD 2006.  [name]. Visual disturbance     a. 12/2011 Left eye visual changes->w/u for TIA->Carotid U/S:  No ICA stenosis; Head CT: No acute abnl; MRI/MRA: mild RPCA stenosis; Echo: EF 55-60%, Triv AI, No PFO.  . Tobacco abuse     a. 50 pack year hx.  . Cholelithiasis     a. s/p cholecystectomy 10/2011  . Chest pain     a. reportedly nl MV and echo 10/2011 - [place]  . Diverticulosis     with ? of 'itis  . Hypertension   . Family history of colon cancer     mother  . History of amaurosis fugax 01/2011    L eye; saw neuro at [facility] (Dr. Johnell ComingsMieden).  Lab w/u and carotid  doppler nl per old records.  . Anxiety and depression 2013    Lots of stress  . Hyperlipidemia     LDL 191 in 2013 per old records   Past Surgical History  Procedure Laterality Date  . Cesarean section  1990  . Shoulder surgery Left 1997 &98  . Laparoscopic cholecystectomy  10/19/11  . Umbilical hernia repair  2014    with mesh  . Laparoscopy  05/2002    Dr. Harmon Dunimothy Davis--no cause for LLQ abd pain found  . Endometrial ablation  2011  . Colonoscopy  10/2001    Dr. Ewing SchleinMagod; diverticulosis, int/ext hem, one small polyp removed.  . Esophagogastroduodenoscopy  02/2004    Dr. Ewing SchleinMagod; normal   Family History  Problem Relation Age of Onset  . Dementia Father   . Parkinson's disease Father     d. 2013  . Hypertension Father   . Hyperlipidemia Father   . Heart disease Father   . Heart attack Mother     died @ 6767  . Colon cancer Mother   . Hyperlipidemia Mother   . Heart disease Mother   . Hypertension Mother   . Esophageal cancer Brother   . Diabetes Neg Hx    Social History  Substance Use Topics  .  Smoking status: Current Every Day Smoker -- 0.50 packs/day for 30 years    Types: Cigarettes  . Smokeless tobacco: Never Used     Comment: Smoked 2ppd for roughly 25 yrs but cut back to 1/2 ppd about 6 mos ago.  . Alcohol Use: No   OB History    No data available     Review of Systems  Constitutional: Positive for chills. Negative for fever.  Respiratory: Positive for cough.   Cardiovascular: Positive for chest pain. Negative for leg swelling.  Gastrointestinal: Negative for nausea and vomiting.  Genitourinary: Negative for difficulty urinating.  All other systems reviewed and are negative.     Allergies  Flagyl; Elavil; Klonopin; and Levaquin  Home Medications   Prior to Admission medications   Medication Sig Start Date End Date Taking? Authorizing Provider  albuterol (PROVENTIL HFA;VENTOLIN HFA) 108 (90 Base) MCG/ACT inhaler Inhale 2 puffs into the lungs every 6  (six) hours as needed for wheezing or shortness of breath. 03/19/15   Shon Baton, MD  cloNIDine (CATAPRES) 0.2 MG tablet 1 tab po tid 03/16/15   Jeoffrey Massed, MD  HYDROcodone-acetaminophen (NORCO/VICODIN) 5-325 MG tablet Take 1 tablet by mouth every 6 (six) hours as needed for moderate pain. 03/19/15   Shon Baton, MD  HYDROmorphone (DILAUDID) 2 MG tablet Take by mouth 3 (three) times daily.    Historical Provider, MD  ondansetron (ZOFRAN ODT) 4 MG disintegrating tablet Take 1 tablet (4 mg total) by mouth every 8 (eight) hours as needed for nausea. 03/05/15   Tilden Fossa, MD  predniSONE (DELTASONE) 20 MG tablet Take 1 tablet (20 mg total) by mouth daily with breakfast. 03/16/15   Jeoffrey Massed, MD   BP 149/98 mmHg  Pulse 74  Temp(Src) 98.8 F (37.1 C) (Oral)  Resp 19  Ht  (1.6 m)  Wt 188 lb (85.276 kg)  BMI 33.31 kg/m2  SpO2 95% Physical Exam  Constitutional: She is oriented to person, place, and time. She appears well-developed and well-nourished. No distress.  HENT:  Head: Normocephalic and atraumatic.  Eyes: Pupils are equal, round, and reactive to light.  Cardiovascular: Normal rate, regular rhythm and normal heart sounds.   Pulmonary/Chest: Effort normal. No respiratory distress. She has wheezes. She exhibits tenderness.  Abdominal: Soft. Bowel sounds are normal. There is no tenderness. There is no rebound.  Musculoskeletal: She exhibits no edema.  Neurological: She is alert and oriented to person, place, and time.  Skin: Skin is warm and dry.  Psychiatric: She has a normal mood and affect.  Nursing note and vitals reviewed.   ED Course  Procedures (including critical care time) Labs Review Labs Reviewed  BASIC METABOLIC PANEL - Abnormal; Notable for the following:    Glucose, Bld 102 (*)    Calcium 8.6 (*)    All other components within normal limits  CBC  TROPONIN I  TROPONIN I    Imaging Review Dg Chest 2 View  03/18/2015  CLINICAL DATA:   Chest pain and cough EXAM: CHEST  2 VIEW COMPARISON:  12/17/2014 FINDINGS: Normal heart size and mediastinal contours. No acute infiltrate or edema. No effusion or pneumothorax. No acute osseous findings. IMPRESSION: No evidence of active disease Electronically Signed   By: Marnee Spring M.D.   On: 03/18/2015 22:52   I have personally reviewed and evaluated these images and lab results as part of my medical decision-making.   EKG Interpretation   Date/Time:  Saturday March 18 2015  22:36:05 EST Ventricular Rate:  89 PR Interval:  134 QRS Duration: 76 QT Interval:  364 QTC Calculation: 442 R Axis:   40 Text Interpretation:  Normal sinus rhythm Normal ECG Confirmed by HORTON   MD, COURTNEY ([id number]) on 03/19/2015 1:45:11 AM      MDM   Final diagnoses:  Upper respiratory infection  Other chest pain   Patient presents for chest pain. Reports upper respiratory symptoms. Pain is been persistent and ongoing for one week.  She is reproducible tenderness on exam as well as wheezing. She was given a DuoNeb. EKG and troponin 2 are reassuring. Doubt ACS. Other lab work is reassuring. 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 She was given an inhaler and states that she has had some improvement of her upper respiratory symptoms. Pain not improved with Toradol. She was given one dose of Norco. She is able to ambulate around the department and maintained sats 97%. Discuss with patient continued supportive measures at home. She is in pain management clinic. She does not have any breakthrough medication and states that she is maxed out dosage with ibuprofen. We will give a one-day supply of breakthrough medication for pain given that it is the weekend. Follow-up with pain management and PCP.  After history, exam, and medical workup I feel the patient has been appropriately medically screened and is safe for discharge home. Pertinent diagnoses were discussed with the patient. Patient was given return  precautions.   Shon Baton, MD 03/20/15 [phone]

## 2015-03-19 NOTE — ED Notes (Signed)
MD aware pt has no improvement in pain after toradol given

## 2015-03-19 NOTE — Discharge Instructions (Signed)
Chest Wall Pain °Chest wall pain is pain in or around the bones and muscles of your chest. Sometimes, an injury causes this pain. Sometimes, the cause may not be known. This pain may take several weeks or longer to get better. °HOME CARE INSTRUCTIONS  °Pay attention to any changes in your symptoms. Take these actions to help with your pain:  °· Rest as told by your health care provider.   °· Avoid activities that cause pain. These include any activities that use your chest muscles or your abdominal and side muscles to lift heavy items.    °· If directed, apply ice to the painful area: °· Put ice in a plastic bag. °· Place a towel between your skin and the bag. °· Leave the ice on for 20 minutes, 2-3 times per day. °· Take over-the-counter and prescription medicines only as told by your health care provider. °· Do not use tobacco products, including cigarettes, chewing tobacco, and e-cigarettes. If you need help quitting, ask your health care provider. °· Keep all follow-up visits as told by your health care provider. This is important. °SEEK MEDICAL CARE IF: °· You have a fever. °· Your chest pain becomes worse. °· You have new symptoms. °SEEK IMMEDIATE MEDICAL CARE IF: °· You have nausea or vomiting. °· You feel sweaty or light-headed. °· You have a cough with phlegm (sputum) or you cough up blood. °· You develop shortness of breath. °  °This information is not intended to replace advice given to you by your health care provider. Make sure you discuss any questions you have with your health care provider. °  °Document Released: 12/24/2004 Document Revised: 09/14/2014 Document Reviewed: 03/21/2014 °Elsevier Interactive Patient Education ©2016 Elsevier Inc. ° °Upper Respiratory Infection, Adult °Most upper respiratory infections (URIs) are a viral infection of the air passages leading to the lungs. A URI affects the nose, throat, and upper air passages. The most common type of URI is nasopharyngitis and is typically  referred to as "the common cold." °URIs run their course and usually go away on their own. Most of the time, a URI does not require medical attention, but sometimes a bacterial infection in the upper airways can follow a viral infection. This is called a secondary infection. Sinus and middle ear infections are common types of secondary upper respiratory infections. °Bacterial pneumonia can also complicate a URI. A URI can worsen asthma and chronic obstructive pulmonary disease (COPD). Sometimes, these complications can require emergency medical care and may be life threatening.  °CAUSES °Almost all URIs are caused by viruses. A virus is a type of germ and can spread from one person to another.  °RISKS FACTORS °You may be at risk for a URI if:  °· You smoke.   °· You have chronic heart or lung disease. °· You have a weakened defense (immune) system.   °· You are very young or very old.   °· You have nasal allergies or asthma. °· You work in crowded or poorly ventilated areas. °· You work in health care facilities or schools. °SIGNS AND SYMPTOMS  °Symptoms typically develop 2-3 days after you come in contact with a cold virus. Most viral URIs last 7-10 days. However, viral URIs from the influenza virus (flu virus) can last 14-18 days and are typically more severe. Symptoms may include:  °· Runny or stuffy (congested) nose.   °· Sneezing.   °· Cough.   °· Sore throat.   °· Headache.   °· Fatigue.   °· Fever.   °· Loss of appetite.   °· Pain   in your forehead, behind your eyes, and over your cheekbones (sinus pain). °· Muscle aches.   °DIAGNOSIS  °Your health care provider may diagnose a URI by: °· Physical exam. °· Tests to check that your symptoms are not due to another condition such as: °¨ Strep throat. °¨ Sinusitis. °¨ Pneumonia. °¨ Asthma. °TREATMENT  °A URI goes away on its own with time. It cannot be cured with medicines, but medicines may be prescribed or recommended to relieve symptoms. Medicines may  help: °· Reduce your fever. °· Reduce your cough. °· Relieve nasal congestion. °HOME CARE INSTRUCTIONS  °· Take medicines only as directed by your health care provider.   °· Gargle warm saltwater or take cough drops to comfort your throat as directed by your health care provider. °· Use a warm mist humidifier or inhale steam from a shower to increase air moisture. This may make it easier to breathe. °· Drink enough fluid to keep your urine clear or pale yellow.   °· Eat soups and other clear broths and maintain good nutrition.   °· Rest as needed.   °· Return to work when your temperature has returned to normal or as your health care provider advises. You may need to stay home longer to avoid infecting others. You can also use a face mask and careful hand washing to prevent spread of the virus. °· Increase the usage of your inhaler if you have asthma.   °· Do not use any tobacco products, including cigarettes, chewing tobacco, or electronic cigarettes. If you need help quitting, ask your health care provider. °PREVENTION  °The best way to protect yourself from getting a cold is to practice good hygiene.  °· Avoid oral or hand contact with people with cold symptoms.   °· Wash your hands often if contact occurs.   °There is no clear evidence that vitamin C, vitamin E, echinacea, or exercise reduces the chance of developing a cold. However, it is always recommended to get plenty of rest, exercise, and practice good nutrition.  °SEEK MEDICAL CARE IF:  °· You are getting worse rather than better.   °· Your symptoms are not controlled by medicine.   °· You have chills. °· You have worsening shortness of breath. °· You have brown or red mucus. °· You have yellow or brown nasal discharge. °· You have pain in your face, especially when you bend forward. °· You have a fever. °· You have swollen neck glands. °· You have pain while swallowing. °· You have white areas in the back of your throat. °SEEK IMMEDIATE MEDICAL CARE IF:   °· You have severe or persistent: °¨ Headache. °¨ Ear pain. °¨ Sinus pain. °¨ Chest pain. °· You have chronic lung disease and any of the following: °¨ Wheezing. °¨ Prolonged cough. °¨ Coughing up blood. °¨ A change in your usual mucus. °· You have a stiff neck. °· You have changes in your: °¨ Vision. °¨ Hearing. °¨ Thinking. °¨ Mood. °MAKE SURE YOU:  °· Understand these instructions. °· Will watch your condition. °· Will get help right away if you are not doing well or get worse. °  °This information is not intended to replace advice given to you by your health care provider. Make sure you discuss any questions you have with your health care provider. °  °Document Released: 06/19/2000 Document Revised: 05/10/2014 Document Reviewed: 03/31/2013 °Elsevier Interactive Patient Education ©2016 Elsevier Inc. ° °

## 2015-03-19 NOTE — ED Notes (Signed)
MD at bedside. 

## 2015-03-20 DIAGNOSIS — G90519 Complex regional pain syndrome I of unspecified upper limb: Secondary | ICD-10-CM | POA: Diagnosis not present

## 2015-03-20 DIAGNOSIS — G8929 Other chronic pain: Secondary | ICD-10-CM | POA: Diagnosis not present

## 2015-03-20 DIAGNOSIS — M47816 Spondylosis without myelopathy or radiculopathy, lumbar region: Secondary | ICD-10-CM | POA: Diagnosis not present

## 2015-03-27 ENCOUNTER — Emergency Department (HOSPITAL_BASED_OUTPATIENT_CLINIC_OR_DEPARTMENT_OTHER)
Admission: EM | Admit: 2015-03-27 | Discharge: 2015-03-27 | Disposition: A | Discharge status: Home / Self Care | Payer: Medicare Other | Attending: Emergency Medicine | Admitting: Emergency Medicine

## 2015-03-27 ENCOUNTER — Encounter: Payer: Self-pay | Admitting: Family Medicine

## 2015-03-27 ENCOUNTER — Emergency Department (HOSPITAL_BASED_OUTPATIENT_CLINIC_OR_DEPARTMENT_OTHER): Payer: Medicare Other

## 2015-03-27 ENCOUNTER — Other Ambulatory Visit: Payer: Self-pay

## 2015-03-27 ENCOUNTER — Ambulatory Visit (INDEPENDENT_AMBULATORY_CARE_PROVIDER_SITE_OTHER): Payer: Medicare Other | Admitting: Family Medicine

## 2015-03-27 ENCOUNTER — Encounter (HOSPITAL_BASED_OUTPATIENT_CLINIC_OR_DEPARTMENT_OTHER): Payer: Self-pay

## 2015-03-27 VITALS — BP 134/91 | HR 88 | Temp 98.0°F | Resp 20 | Wt 187.0 lb

## 2015-03-27 DIAGNOSIS — F17210 Nicotine dependence, cigarettes, uncomplicated: Secondary | ICD-10-CM | POA: Diagnosis not present

## 2015-03-27 DIAGNOSIS — I10 Essential (primary) hypertension: Secondary | ICD-10-CM | POA: Diagnosis not present

## 2015-03-27 DIAGNOSIS — R5383 Other fatigue: Secondary | ICD-10-CM | POA: Insufficient documentation

## 2015-03-27 DIAGNOSIS — R079 Chest pain, unspecified: Secondary | ICD-10-CM | POA: Diagnosis not present

## 2015-03-27 DIAGNOSIS — J209 Acute bronchitis, unspecified: Secondary | ICD-10-CM | POA: Diagnosis not present

## 2015-03-27 DIAGNOSIS — R05 Cough: Secondary | ICD-10-CM | POA: Diagnosis not present

## 2015-03-27 MED ORDER — IPRATROPIUM-ALBUTEROL 0.5-2.5 (3) MG/3ML IN SOLN
3.0000 mL | Freq: Once | RESPIRATORY_TRACT | Status: AC
  Administered 2015-03-27: 3 mL via RESPIRATORY_TRACT

## 2015-03-27 MED ORDER — DOXYCYCLINE HYCLATE 100 MG PO TABS: 100.0000 mg | ORAL_TABLET | Freq: Two times a day (BID) | ORAL | Status: DC

## 2015-03-27 MED ORDER — METHYLPREDNISOLONE ACETATE 80 MG/ML IJ SUSP
80.0000 mg | Freq: Once | INTRAMUSCULAR | Status: AC
  Administered 2015-03-27: 80 mg via INTRAMUSCULAR

## 2015-03-27 NOTE — ED Notes (Signed)
Pt states she has been seen twice in ED and twice by PCP for "respiratory infection"-c/o cont'd CP, fatigue-pt tearful-states she has appt with PCP at 2:30 today but was advised to come to ED

## 2015-03-27 NOTE — Patient Instructions (Signed)
Doxycyline prescribed, every 12 hours for 10 days. Steroid injection today.  mucinex plain flonase nasal spray for 2 weeks at least and nasal saline flushes  3x a day.  Consider coricidin (ok with BP).  Rest and hydrate. - STOP SMOKING  Acute Bronchitis Bronchitis is inflammation of the airways that extend from the windpipe into the lungs (bronchi). The inflammation often causes mucus to develop. This leads to a cough, which is the most common symptom of bronchitis.  In acute bronchitis, the condition usually develops suddenly and goes away over time, usually in a couple weeks. Smoking, allergies, and asthma can make bronchitis worse. Repeated episodes of bronchitis may cause further lung problems.  CAUSES Acute bronchitis is most often caused by the same virus that causes a cold. The virus can spread from person to person (contagious) through coughing, sneezing, and touching contaminated objects. SIGNS AND SYMPTOMS   Cough.   Fever.   Coughing up mucus.   Body aches.   Chest congestion.   Chills.   Shortness of breath.   Sore throat.  DIAGNOSIS  Acute bronchitis is usually diagnosed through a physical exam. Your health care provider will also ask you questions about your medical history. Tests, such as chest X-rays, are sometimes done to rule out other conditions.  TREATMENT  Acute bronchitis usually goes away in a couple weeks. Oftentimes, no medical treatment is necessary. Medicines are sometimes given for relief of fever or cough. Antibiotic medicines are usually not needed but may be prescribed in certain situations. In some cases, an inhaler may be recommended to help reduce shortness of breath and control the cough. A cool mist vaporizer may also be used to help thin bronchial secretions and make it easier to clear the chest.  HOME CARE INSTRUCTIONS  Get plenty of rest.   Drink enough fluids to keep your urine clear or pale yellow (unless you have a medical  condition that requires fluid restriction). Increasing fluids may help thin your respiratory secretions (sputum) and reduce chest congestion, and it will prevent dehydration.   Take medicines only as directed by your health care provider.  If you were prescribed an antibiotic medicine, finish it all even if you start to feel better.  Avoid smoking and secondhand smoke. Exposure to cigarette smoke or irritating chemicals will make bronchitis worse. If you are a smoker, consider using nicotine gum or skin patches to help control withdrawal symptoms. Quitting smoking will help your lungs heal faster.   Reduce the chances of another bout of acute bronchitis by washing your hands frequently, avoiding people with cold symptoms, and trying not to touch your hands to your mouth, nose, or eyes.   Keep all follow-up visits as directed by your health care provider.  SEEK MEDICAL CARE IF: Your symptoms do not improve after 1 week of treatment.  SEEK IMMEDIATE MEDICAL CARE IF:  You develop an increased fever or chills.   You have chest pain.   You have severe shortness of breath.  You have bloody sputum.   You develop dehydration.  You faint or repeatedly feel like you are going to pass out.  You develop repeated vomiting.  You develop a severe headache. MAKE SURE YOU:   Understand these instructions.  Will watch your condition.  Will get help right away if you are not doing well or get worse.   This information is not intended to replace advice given to you by your health care provider. Make sure you discuss any questions  you have with your health care provider.   Document Released: 02/01/2004 Document Revised: 01/14/2014 Document Reviewed: 06/16/2012 Elsevier Interactive Patient Education 2016 [employer].  Smoking Cessation, Tips for Success If you are ready to quit smoking, congratulations! You have chosen to help yourself be healthier. Cigarettes bring nicotine, tar,  carbon monoxide, and other irritants into your body. Your lungs, heart, and blood vessels will be able to work better without these poisons. There are many different ways to quit smoking. Nicotine gum, nicotine patches, a nicotine inhaler, or nicotine nasal spray can help with physical craving. Hypnosis, support groups, and medicines help break the habit of smoking. WHAT THINGS CAN I DO TO MAKE QUITTING EASIER?  Here are some tips to help you quit for good:  Pick a date when you will quit smoking completely. Tell all of your friends and family about your plan to quit on that date.  Do not try to slowly cut down on the number of cigarettes you are smoking. Pick a quit date and quit smoking completely starting on that day.  Throw away all cigarettes.   Clean and remove all ashtrays from your home, work, and car.  On a card, write down your reasons for quitting. Carry the card with you and read it when you get the urge to smoke.  Cleanse your body of nicotine. Drink enough water and fluids to keep your urine clear or pale yellow. Do this after quitting to flush the nicotine from your body.  Learn to predict your moods. Do not let a bad situation be your excuse to have a cigarette. Some situations in your life might tempt you into wanting a cigarette.  Never have "just one" cigarette. It leads to wanting another and another. Remind yourself of your decision to quit.  Change habits associated with smoking. If you smoked while driving or when feeling stressed, try other activities to replace smoking. Stand up when drinking your coffee. Brush your teeth after eating. Sit in a different chair when you read the paper. Avoid alcohol while trying to quit, and try to drink fewer caffeinated beverages. Alcohol and caffeine may urge you to smoke.  Avoid foods and drinks that can trigger a desire to smoke, such as sugary or spicy foods and alcohol.  Ask people who smoke not to smoke around you.  Have  something planned to do right after eating or having a cup of coffee. For example, plan to take a walk or exercise.  Try a relaxation exercise to calm you down and decrease your stress. Remember, you may be tense and nervous for the first 2 weeks after you quit, but this will pass.  Find new activities to keep your hands busy. Play with a pen, coin, or rubber band. Doodle or draw things on paper.  Brush your teeth right after eating. This will help cut down on the craving for the taste of tobacco after meals. You can also try mouthwash.   Use oral substitutes in place of cigarettes. Try using lemon drops, carrots, cinnamon sticks, or chewing gum. Keep them handy so they are available when you have the urge to smoke.  When you have the urge to smoke, try deep breathing.  Designate your home as a nonsmoking area.  If you are a heavy smoker, ask your health care provider about a prescription for nicotine chewing gum. It can ease your withdrawal from nicotine.  Reward yourself. Set aside the cigarette money you save and buy yourself something nice.  Look for support from others. Join a support group or smoking cessation program. Ask someone at home or at work to help you with your plan to quit smoking.  Always ask yourself, "Do I need this cigarette or is this just a reflex?" Tell yourself, "Today, I choose not to smoke," or "I do not want to smoke." You are reminding yourself of your decision to quit.  Do not replace cigarette smoking with electronic cigarettes (commonly called e-cigarettes). The safety of e-cigarettes is unknown, and some may contain harmful chemicals.  If you relapse, do not give up! Plan ahead and think about what you will do the next time you get the urge to smoke. HOW WILL I FEEL WHEN I QUIT SMOKING? You may have symptoms of withdrawal because your body is used to nicotine (the addictive substance in cigarettes). You may crave cigarettes, be irritable, feel very hungry,  cough often, get headaches, or have difficulty concentrating. The withdrawal symptoms are only temporary. They are strongest when you first quit but will go away within 10-14 days. When withdrawal symptoms occur, stay in control. Think about your reasons for quitting. Remind yourself that these are signs that your body is healing and getting used to being without cigarettes. Remember that withdrawal symptoms are easier to treat than the major diseases that smoking can cause.  Even after the withdrawal is over, expect periodic urges to smoke. However, these cravings are generally short lived and will go away whether you smoke or not. Do not smoke! WHAT RESOURCES ARE AVAILABLE TO HELP ME QUIT SMOKING? Your health care provider can direct you to community resources or hospitals for support, which may include:  Group support.  Education.  Hypnosis.  Therapy.   This information is not intended to replace advice given to you by your health care provider. Make sure you discuss any questions you have with your health care provider.   Document Released: 09/22/2003 Document Revised: 01/14/2014 Document Reviewed: 06/11/2012 Elsevier Interactive Patient Education [employer]2016 Elsevier Inc.

## 2015-03-27 NOTE — Progress Notes (Signed)
Patient ID: Dana Morrow, female   DOB: 06/04/1965, 50 y.o.   MRN: [id number]    Dana Morrow , 06/04/1965, 50 y.o., female MRN: [id number]  CC: chest discomfort Subjective: Pt has been evaluated by ED and PCP for multiple visits for the past 3 weeks, for chest burning, fatigue, decreased appetite, night sweats that are worsening over the last week. Pt denies nausea, vomit, diarrhea, or abdominal pain. Pt has tried nothing to ease their symptoms 2/2 to be afraid to mix any medications with hypertension meds and chronic pain med. She is using the albuterol inhaler twice a day. She endorses continued wheezing and shortness of breath.  She is an everyday smoker.  EKG and CXR reviewed from ED visit today. Dg Chest 2 View  03/27/2015  CLINICAL DATA:  50 year old female with history of cough, congestion and chest pain for the past 3 weeks. Smoker. EXAM: CHEST  2 VIEW COMPARISON:  Chest x-ray 03/18/2015. FINDINGS: Diffuse peribronchial cuffing. Lung volumes are normal. No consolidative airspace disease. No pleural effusions. No pneumothorax. No pulmonary nodule or mass noted. Pulmonary vasculature and the cardiomediastinal silhouette are within normal limits. IMPRESSION: 1. Diffuse peribronchial cuffing, suggestive of an acute bronchitis. Electronically Signed   By: Trudie Reedaniel  Entrikin M.D.   On: 03/27/2015 12:10     Allergies  Allergen Reactions  . Flagyl [Metronidazole] Nausea And Vomiting    Oral form only, IV pt can tolerate  . Elavil [Amitriptyline]     hallucinations  . Klonopin [Clonazepam]     Hallucinations   . Levaquin [Levofloxacin Hemihydrate] Hives   Social History  Substance Use Topics  . Smoking status: Current Every Day Smoker -- 0.50 packs/day for 30 years    Types: Cigarettes  . Smokeless tobacco: Never Used  . Alcohol Use: No   Past Medical History  Diagnosis Date  . RSD (reflex sympathetic dystrophy)     a. L shoulder--pain mgmt with Dr[facility] Marlynn [facility]kingMalloy [facility] [place]  . Migraine   . Colon polyps     a. s/p colonoscopy and polypectomy in 2003  . Abdominal pain, chronic, left lower quadrant     a. s/p Laproscopy in 2004 w/o finding of source of discomfort.  [name]. GERD (gastroesophageal reflux disease)   . Hiatal hernia     a. small by EGD 2006.  [name]. Visual disturbance     a. 12/2011 Left eye visual changes->w/u for TIA->Carotid U/S:  No ICA stenosis; Head CT: No acute abnl; MRI/MRA: mild RPCA stenosis; Echo: EF 55-60%, Triv AI, No PFO.  . Tobacco abuse     a. 50 pack year hx.  . Cholelithiasis     a. s/p cholecystectomy 10/2011  . Chest pain     a. reportedly nl MV and echo 10/2011 - [place]  . Diverticulosis     with ? of 'itis  . Hypertension   . Family history of colon cancer     mother  . History of amaurosis fugax 01/2011    L eye; saw neuro at [facility] (Dr. Johnell ComingsMieden).  Lab w/u and carotid doppler nl per old records.  . Anxiety and depression 2013    Lots of stress  . Hyperlipidemia     LDL 191 in 2013 per old records   Past Surgical History  Procedure Laterality Date  . Cesarean section  1990  . Shoulder surgery Left 1997 &98  . Laparoscopic cholecystectomy  10/19/11  . Umbilical hernia repair  2014  with mesh  . Laparoscopy  05/2002    Dr. Harmon Dun cause for LLQ abd pain found  . Endometrial ablation  2011  . Colonoscopy  10/2001    Dr. Ewing Schlein; diverticulosis, int/ext hem, one small polyp removed.  . Esophagogastroduodenoscopy  02/2004    Dr. Ewing Schlein; normal   Family History  Problem Relation Age of Onset  . Dementia Father   . Parkinson's disease Father     d. 2013  . Hypertension Father   . Hyperlipidemia Father   . Heart disease Father   . Heart attack Mother     died @ 35  . Colon cancer Mother   . Hyperlipidemia Mother   . Heart disease Mother   . Hypertension Mother   . Esophageal cancer Brother   . Diabetes Neg Hx      Medication List       This  list is accurate as of: 03/27/15  2:10 PM.  Always use your most recent med list.               albuterol 108 (90 Base) MCG/ACT inhaler  Commonly known as:  PROVENTIL HFA;VENTOLIN HFA  Inhale 2 puffs into the lungs every 6 (six) hours as needed for wheezing or shortness of breath.     cloNIDine 0.2 MG tablet  Commonly known as:  CATAPRES  1 tab po tid     HYDROmorphone 2 MG tablet  Commonly known as:  DILAUDID  Take by mouth 3 (three) times daily.     ondansetron 4 MG disintegrating tablet  Commonly known as:  ZOFRAN ODT  Take 1 tablet (4 mg total) by mouth every 8 (eight) hours as needed for nausea.         ROS: Negative, with the exception of above mentioned in HPI   Objective:  BP 134/91 mmHg  Pulse 88  Temp(Src) 98 F (36.7 C)  Resp 20  Wt 187 lb (84.823 kg)  SpO2 100% Body mass index is 33.13 kg/(m^2). Gen: Afebrile. No acute distress. Nontoxic in appearance, well developed, well nourished, female.  HENT: AT. [place]. Bilateral TM visualized and normal in appearance. MMM, no oral lesions. Bilateral nares with moderate erythema, swelling and drainage. Throat without erythema or exudates. Cough and hoarseness present on exam. No TTP facial sinus.  Eyes:Pupils Equal Round Reactive to light, Extraocular movements intact,  Conjunctiva without redness, discharge or icterus. Neck/lymp/endocrine: Supple,no  lymphadenopathy CV: mildly tachycardiac (recent albuterol use) Chest: diffuse exp/insp wheeze. Rhonchi present. No crackles.  Abd: Soft.  NTND. BS present  Neuro: Normal gait. PERLA. EOMi. Alert. Oriented x3   Assessment/Plan: Dana Morrow is a 50 y.o. female present for acute OV for  1. Acute bronchitis, unspecified organism - doxycycline (VIBRA-TABS) 100 MG tablet; Take 1 tablet (100 mg total) by mouth 2 (two) times daily.  Dispense: 20 tablet; Refill: 0 - Duoneb tx in OV - Doxycyline. , Depo medrol (IM), mucinex plain, flonase, nasal saline flushes. Consider  coricidin (ok with BP).  - Rest and hydrate. - Smoking cessation material provided. Pt has not had a cigarette in 1 week - F/U with PCP in 1 week if not improved.   electronically signed by:  Felix Pacini, DO  [facility] [place]

## 2015-03-30 ENCOUNTER — Ambulatory Visit: Payer: Medicare Other | Admitting: Family Medicine

## 2015-04-05 DIAGNOSIS — M47816 Spondylosis without myelopathy or radiculopathy, lumbar region: Secondary | ICD-10-CM | POA: Diagnosis not present

## 2015-04-05 DIAGNOSIS — G90519 Complex regional pain syndrome I of unspecified upper limb: Secondary | ICD-10-CM | POA: Diagnosis not present

## 2015-04-05 DIAGNOSIS — G8929 Other chronic pain: Secondary | ICD-10-CM | POA: Diagnosis not present

## 2015-04-17 ENCOUNTER — Other Ambulatory Visit: Payer: Self-pay | Admitting: *Deleted

## 2015-04-17 NOTE — Telephone Encounter (Signed)
Left message for pt to call back.   Received refill request from [facility].   RF request for clonidine LOV: 03/16/15 Next ov: None - pt called back and scheduled apt for 04/18/15 at 11:00am. She stated that she is trying to get all of her Rx's at one place, but does not need refill at this time since she just p/u [facility] day supply [facility].  Last written: 03/16/15 #90 w/ 1RF

## 2015-04-18 ENCOUNTER — Ambulatory Visit (INDEPENDENT_AMBULATORY_CARE_PROVIDER_SITE_OTHER): Payer: Medicare Other | Admitting: Family Medicine

## 2015-04-18 ENCOUNTER — Encounter: Payer: Self-pay | Admitting: Family Medicine

## 2015-04-18 VITALS — BP 127/84 | HR 82 | Temp 98.0°F | Resp 16 | Ht 63.0 in | Wt 190.5 lb

## 2015-04-18 DIAGNOSIS — I10 Essential (primary) hypertension: Secondary | ICD-10-CM

## 2015-04-18 MED ORDER — CLONIDINE HCL 0.2 MG PO TABS: ORAL_TABLET | ORAL | Status: DC

## 2015-04-18 NOTE — Progress Notes (Signed)
OFFICE VISIT  04/18/2015   CC:  Chief Complaint  Patient presents with  . Follow-up    HTN. Pt is not fasting.    HPI:    Patient is a 50 y.o. Caucasian female who presents for 1 mo f/u HTN. Last visit I increased her clonidine to 0.2mg  tid. Home bp checks all good and she is happy.  Systolic < 140 avg, diastolic avg 80s. HR usually 90s-100.  She is tolerating the clonidine well.  Had recent prolonged bronchitis illness, was rx'd doxycycline and she says she feels so much better. Feels like starting her exercise again. Is cutting back on smoking quite a bit at this time but not ready to commit to total cessation.   Past Medical History  Diagnosis Date  . RSD (reflex sympathetic dystrophy)     a. L shoulder--pain mgmt with [facility] Marlynn [facility]Malloy [facility] [place]  . Migraine   . Colon polyps     a. s/p colonoscopy and polypectomy in 2003  . Abdominal pain, chronic, left lower quadrant     a. s/p Laproscopy in 2004 w/o finding of source of discomfort.  [name]. GERD (gastroesophageal reflux disease)   . Hiatal hernia     a. small by EGD 2006.  [name]. Visual disturbance     a. 12/2011 Left eye visual changes->w/u for TIA->Carotid U/S:  No ICA stenosis; Head CT: No acute abnl; MRI/MRA: mild RPCA stenosis; Echo: EF 55-60%, Triv AI, No PFO.  . Tobacco abuse     a. 50 pack year hx.  . Cholelithiasis     a. s/p cholecystectomy 10/2011  . Chest pain     a. reportedly nl MV and echo 10/2011 - [place]  . Diverticulosis     with ? of 'itis  . Hypertension   . Family history of colon cancer     mother  . History of amaurosis fugax 01/2011    L eye; saw neuro at [facility] (Dr. Johnell ComingsMieden).  Lab w/u and carotid doppler nl per old records.  . Anxiety and depression 2013    Lots of stress  . Hyperlipidemia     LDL 191 in 2013 per old records    Past Surgical History  Procedure Laterality Date  . Cesarean section  1990  . Shoulder surgery Left  1997 &98  . Laparoscopic cholecystectomy  10/19/11  . Umbilical hernia repair  2014    with mesh  . Laparoscopy  05/2002    Dr. Harmon Dunimothy Davis--no cause for LLQ abd pain found  . Endometrial ablation  2011  . Colonoscopy  10/2001    Dr. Ewing SchleinMagod; diverticulosis, int/ext hem, one small polyp removed.  . Esophagogastroduodenoscopy  02/2004    Dr. Ewing SchleinMagod; normal    Outpatient Prescriptions Prior to Visit  Medication Sig Dispense Refill  . albuterol (PROVENTIL HFA;VENTOLIN HFA) 108 (90 Base) MCG/ACT inhaler Inhale 2 puffs into the lungs every 6 (six) hours as needed for wheezing or shortness of breath. 1 Inhaler 0  . HYDROmorphone (DILAUDID) 2 MG tablet Take by mouth 3 (three) times daily.    . ondansetron (ZOFRAN ODT) 4 MG disintegrating tablet Take 1 tablet (4 mg total) by mouth every 8 (eight) hours as needed for nausea. 12 tablet 0  . cloNIDine (CATAPRES) 0.2 MG tablet 1 tab po tid 90 tablet 1  . doxycycline (VIBRA-TABS) 100 MG tablet Take 1 tablet (100 mg total) by mouth 2 (two) times daily. (Patient not taking: Reported on 04/18/2015) 20  tablet 0   No facility-administered medications prior to visit.    Allergies  Allergen Reactions  . Flagyl [Metronidazole] Nausea And Vomiting    Oral form only, IV pt can tolerate  . Elavil [Amitriptyline]     hallucinations  . Klonopin [Clonazepam]     Hallucinations   . Levaquin [Levofloxacin Hemihydrate] Hives    ROS As per HPI  PE: Blood pressure 127/84, pulse 82, temperature 98 F (36.7 C), temperature source Oral, resp. rate 16, height  (1.6 m), weight 190 lb 8 oz (86.41 kg), SpO2 99 %. Gen: Alert, well appearing.  Patient is oriented to person, place, time, and situation. CV: RRR, no m/r/g.   LUNGS: CTA bilat, nonlabored resps, good aeration in all lung fields. EXT: no clubbing, cyanosis, or edema.   LABS:  Lab Results  Component Value Date   WBC 7.4 03/18/2015   HGB 14.1 03/18/2015   HCT 41.7 03/18/2015   MCV 92.3  03/18/2015   PLT 265 03/18/2015     Chemistry      Component Value Date/Time   NA 136 03/18/2015 2237   K 4.2 03/18/2015 2237   CL 103 03/18/2015 2237   CO2 23 03/18/2015 2237   BUN 20 03/18/2015 2237   CREATININE 0.71 03/18/2015 2237      Component Value Date/Time   CALCIUM 8.6* 03/18/2015 2237   ALKPHOS 90 03/05/2015 1740   AST 15 03/05/2015 1740   ALT 13* 03/05/2015 1740   BILITOT 0.7 03/05/2015 1740       IMPRESSION AND PLAN:  1) Essential HTN: The current medical regimen is effective;  continue present plan and medications. Lytes/cr 1 mo ago were good.  2) Acute bronchitis: resolved appropriately--she is well now.  3) Tobacco dependence: encouraged total cessation.  An After Visit Summary was printed and given to the patient.  FOLLOW UP: Return in about 6 months (around 10/18/2015) for routine chronic illness f/u-fasting (30 min).  Signed:  Santiago Bumpers, MD           04/18/2015

## 2015-04-18 NOTE — Progress Notes (Signed)
Pre visit review using our clinic review tool, if applicable. No additional management support is needed unless otherwise documented below in the visit note. 

## 2015-05-03 ENCOUNTER — Encounter (HOSPITAL_BASED_OUTPATIENT_CLINIC_OR_DEPARTMENT_OTHER): Payer: Self-pay | Admitting: *Deleted

## 2015-05-03 ENCOUNTER — Emergency Department (HOSPITAL_BASED_OUTPATIENT_CLINIC_OR_DEPARTMENT_OTHER)
Admission: EM | Admit: 2015-05-03 | Discharge: 2015-05-03 | Disposition: A | Discharge status: Home / Self Care | Payer: Medicare Other | Attending: Emergency Medicine | Admitting: Emergency Medicine

## 2015-05-03 ENCOUNTER — Emergency Department (HOSPITAL_BASED_OUTPATIENT_CLINIC_OR_DEPARTMENT_OTHER): Payer: Medicare Other

## 2015-05-03 DIAGNOSIS — J069 Acute upper respiratory infection, unspecified: Secondary | ICD-10-CM | POA: Insufficient documentation

## 2015-05-03 DIAGNOSIS — F419 Anxiety disorder, unspecified: Secondary | ICD-10-CM | POA: Insufficient documentation

## 2015-05-03 DIAGNOSIS — F17210 Nicotine dependence, cigarettes, uncomplicated: Secondary | ICD-10-CM | POA: Insufficient documentation

## 2015-05-03 DIAGNOSIS — G43909 Migraine, unspecified, not intractable, without status migrainosus: Secondary | ICD-10-CM | POA: Diagnosis not present

## 2015-05-03 DIAGNOSIS — Z79899 Other long term (current) drug therapy: Secondary | ICD-10-CM | POA: Insufficient documentation

## 2015-05-03 DIAGNOSIS — J40 Bronchitis, not specified as acute or chronic: Secondary | ICD-10-CM | POA: Diagnosis not present

## 2015-05-03 DIAGNOSIS — J209 Acute bronchitis, unspecified: Secondary | ICD-10-CM | POA: Insufficient documentation

## 2015-05-03 DIAGNOSIS — G8929 Other chronic pain: Secondary | ICD-10-CM | POA: Insufficient documentation

## 2015-05-03 DIAGNOSIS — M545 Low back pain: Secondary | ICD-10-CM | POA: Insufficient documentation

## 2015-05-03 DIAGNOSIS — I10 Essential (primary) hypertension: Secondary | ICD-10-CM | POA: Diagnosis not present

## 2015-05-03 DIAGNOSIS — Z8639 Personal history of other endocrine, nutritional and metabolic disease: Secondary | ICD-10-CM | POA: Diagnosis not present

## 2015-05-03 DIAGNOSIS — F329 Major depressive disorder, single episode, unspecified: Secondary | ICD-10-CM | POA: Insufficient documentation

## 2015-05-03 DIAGNOSIS — Z86010 Personal history of colonic polyps: Secondary | ICD-10-CM | POA: Diagnosis not present

## 2015-05-03 DIAGNOSIS — Z8719 Personal history of other diseases of the digestive system: Secondary | ICD-10-CM | POA: Insufficient documentation

## 2015-05-03 DIAGNOSIS — M791 Myalgia: Secondary | ICD-10-CM | POA: Insufficient documentation

## 2015-05-03 DIAGNOSIS — R05 Cough: Secondary | ICD-10-CM | POA: Diagnosis present

## 2015-05-03 MED ORDER — DEXAMETHASONE 4 MG PO TABS: ORAL_TABLET | ORAL | Status: DC

## 2015-05-03 MED ORDER — DEXAMETHASONE 4 MG PO TABS
4.0000 mg | ORAL_TABLET | Freq: Once | ORAL | Status: AC
  Administered 2015-05-03: 4 mg via ORAL
  Filled 2015-05-03: qty 1

## 2015-05-03 MED ORDER — PROMETHAZINE HCL 25 MG PO TABS
25.0000 mg | ORAL_TABLET | Freq: Once | ORAL | Status: AC
  Administered 2015-05-03: 25 mg via ORAL
  Filled 2015-05-03: qty 1

## 2015-05-03 MED ORDER — DOXYCYCLINE HYCLATE 100 MG PO TABS
100.0000 mg | ORAL_TABLET | Freq: Once | ORAL | Status: AC
  Administered 2015-05-03: 100 mg via ORAL
  Filled 2015-05-03: qty 1

## 2015-05-03 MED ORDER — OXYMETAZOLINE HCL 0.05 % NA SOLN
2.0000 | Freq: Once | NASAL | Status: AC
  Administered 2015-05-03: 2 via NASAL
  Filled 2015-05-03: qty 15

## 2015-05-03 MED ORDER — HYDROCODONE-HOMATROPINE 5-1.5 MG/5ML PO SYRP: 5.0000 mL | ORAL_SOLUTION | Freq: Four times a day (QID) | ORAL | Status: DC | PRN

## 2015-05-03 MED ORDER — DOXYCYCLINE HYCLATE 100 MG PO CAPS: 100.0000 mg | ORAL_CAPSULE | Freq: Two times a day (BID) | ORAL | Status: DC

## 2015-05-03 MED ORDER — METHYLPREDNISOLONE 4 MG PO TABS
4.0000 mg | ORAL_TABLET | Freq: Every day | ORAL | Status: DC
  Filled 2015-05-03: qty 1

## 2015-05-03 NOTE — Discharge Instructions (Signed)
Please increase fluids. Please use 2 sprays of Afrin in each nostril every 8 hours for 5 days only. Use doxycycline and Decadron 2 times daily with food. May use Hycodan for cough. Hycodan may cause drowsiness, please use this medication with caution. Cough, Adult A cough helps to clear your throat and lungs. A cough may last only 2-3 weeks (acute), or it may last longer than 8 weeks (chronic). Many different things can cause a cough. A cough may be a sign of an illness or another medical condition. HOME CARE  Pay attention to any changes in your cough.  Take medicines only as told by your doctor.  If you were prescribed an antibiotic medicine, take it as told by your doctor. Do not stop taking it even if you start to feel better.  Talk with your doctor before you try using a cough medicine.  Drink enough fluid to keep your pee (urine) clear or pale yellow.  If the air is dry, use a cold steam vaporizer or humidifier in your home.  Stay away from things that make you cough at work or at home.  If your cough is worse at night, try using extra pillows to raise your head up higher while you sleep.  Do not smoke, and try not to be around smoke. If you need help quitting, ask your doctor.  Do not have caffeine.  Do not drink alcohol.  Rest as needed. GET HELP IF:  You have new problems (symptoms).  You cough up yellow fluid (pus).  Your cough does not get better after 2-3 weeks, or your cough gets worse.  Medicine does not help your cough and you are not sleeping well.  You have pain that gets worse or pain that is not helped with medicine.  You have a fever.  You are losing weight and you do not know why.  You have night sweats. GET HELP RIGHT AWAY IF:  You cough up blood.  You have trouble breathing.  Your heartbeat is very fast.   This information is not intended to replace advice given to you by your health care provider. Make sure you discuss any questions you  have with your health care provider.   Document Released: 09/06/2010 Document Revised: 09/14/2014 Document Reviewed: 03/02/2014 Elsevier Interactive Patient Education 2016 Elsevier Inc[employer]  Upper Respiratory Infection, Adult Most upper respiratory infections (URIs) are caused by a virus. A URI affects the nose, throat, and upper air passages. The most common type of URI is often called "the common cold." HOME CARE   Take medicines only as told by your doctor.  Gargle warm saltwater or take cough drops to comfort your throat as told by your doctor.  Use a warm mist humidifier or inhale steam from a shower to increase air moisture. This may make it easier to breathe.  Drink enough fluid to keep your pee (urine) clear or pale yellow.  Eat soups and other clear broths.  Have a healthy diet.  Rest as needed.  Go back to work when your fever is gone or your doctor says it is okay.  You may need to stay home longer to avoid giving your URI to others.  You can also wear a face mask and wash your hands often to prevent spread of the virus.  Use your inhaler more if you have asthma.  Do not use any tobacco products, including cigarettes, chewing tobacco, or electronic cigarettes. If you need help quitting, ask your doctor. GET HELP  IF:  You are getting worse, not better.  Your symptoms are not helped by medicine.  You have chills.  You are getting more short of breath.  You have brown or red mucus.  You have yellow or brown discharge from your nose.  You have pain in your face, especially when you bend forward.  You have a fever.  You have puffy (swollen) neck glands.  You have pain while swallowing.  You have white areas in the back of your throat. GET HELP RIGHT AWAY IF:   You have very bad or constant:  Headache.  Ear pain.  Pain in your forehead, behind your eyes, and over your cheekbones (sinus pain).  Chest pain.  You have long-lasting (chronic) lung  disease and any of the following:  Wheezing.  Long-lasting cough.  Coughing up blood.  A change in your usual mucus.  You have a stiff neck.  You have changes in your:  Vision.  Hearing.  Thinking.  Mood. MAKE SURE YOU:   Understand these instructions.  Will watch your condition.  Will get help right away if you are not doing well or get worse.   This information is not intended to replace advice given to you by your health care provider. Make sure you discuss any questions you have with your health care provider.   Document Released: 06/12/2007 Document Revised: 05/10/2014 Document Reviewed: 03/31/2013 Elsevier Interactive Patient Education 2016 [employer]. There is

## 2015-05-03 NOTE — ED Notes (Addendum)
Pt c/o pro cough x 1 week . Back pain x 2 days

## 2015-05-05 DIAGNOSIS — M47816 Spondylosis without myelopathy or radiculopathy, lumbar region: Secondary | ICD-10-CM | POA: Diagnosis not present

## 2015-05-05 DIAGNOSIS — G8929 Other chronic pain: Secondary | ICD-10-CM | POA: Diagnosis not present

## 2015-05-05 DIAGNOSIS — Z79891 Long term (current) use of opiate analgesic: Secondary | ICD-10-CM | POA: Diagnosis not present

## 2015-05-05 NOTE — ED Provider Notes (Signed)
CSN: [id number]     Arrival date & time 05/03/15  1830 History   First MD Initiated Contact with Patient 05/03/15 2025     Chief Complaint  Patient presents with  . Cough     (Consider location/radiation/quality/duration/timing/severity/associated sxs/prior Treatment) Patient is a 50 y.o. female presenting with URI. The history is provided by the patient.  URI Presenting symptoms: congestion and cough   Presenting symptoms comment:  Back pain Severity:  Moderate Onset quality:  Gradual Duration:  1 week Timing:  Intermittent Progression:  Worsening Chronicity:  New Relieved by:  Nothing Associated symptoms: myalgias   Risk factors: sick contacts   Risk factors: no chronic cardiac disease, no immunosuppression and no recent travel     Past Medical History  Diagnosis Date  . RSD (reflex sympathetic dystrophy)     a. L shoulder--pain mgmt with Dr. Marlynn Perking at [facility] in [place]  . Migraine   . Colon polyps     a. s/p colonoscopy and polypectomy in 2003  . Abdominal pain, chronic, left lower quadrant     a. s/p Laproscopy in 2004 w/o finding of source of discomfort.  [name] GERD (gastroesophageal reflux disease)   . Hiatal hernia     a. small by EGD 2006.  [name] Visual disturbance     a. 12/2011 Left eye visual changes->w/u for TIA->Carotid U/S:  No ICA stenosis; Head CT: No acute abnl; MRI/MRA: mild RPCA stenosis; Echo: EF 55-60%, Triv AI, No PFO.  . Tobacco abuse     a. 50 pack year hx.  . Cholelithiasis     a. s/p cholecystectomy 10/2011  . Chest pain     a. reportedly nl MV and echo 10/2011 - [place]  . Diverticulosis     with ? of 'itis  . Hypertension   . Family history of colon cancer     mother  . History of amaurosis fugax 01/2011    L eye; saw neuro at [facility] (Dr. Johnell Comings).  Lab w/u and carotid doppler nl per old records.  . Anxiety and depression 2013    Lots of stress  . Hyperlipidemia     LDL 191 in 2013 per  old records   Past Surgical History  Procedure Laterality Date  . Cesarean section  1990  . Shoulder surgery Left 1997 &98  . Laparoscopic cholecystectomy  10/19/11  . Umbilical hernia repair  2014    with mesh  . Laparoscopy  05/2002    Dr. Harmon Dun cause for LLQ abd pain found  . Endometrial ablation  2011  . Colonoscopy  10/2001    Dr. Ewing Schlein; diverticulosis, int/ext hem, one small polyp removed.  . Esophagogastroduodenoscopy  02/2004    Dr. Ewing Schlein; normal   Family History  Problem Relation Age of Onset  . Dementia Father   . Parkinson's disease Father     d. 2013  . Hypertension Father   . Hyperlipidemia Father   . Heart disease Father   . Heart attack Mother     died @ 14  . Colon cancer Mother   . Hyperlipidemia Mother   . Heart disease Mother   . Hypertension Mother   . Esophageal cancer Brother   . Diabetes Neg Hx    Social History  Substance Use Topics  . Smoking status: Current Every Day Smoker -- 0.50 packs/day for 30 years    Types: Cigarettes  . Smokeless tobacco: Never Used  . Alcohol Use: No   OB  History    No data available     Review of Systems  HENT: Positive for congestion.   Respiratory: Positive for cough.   Musculoskeletal: Positive for myalgias and back pain.  All other systems reviewed and are negative.     Allergies  Flagyl; Elavil; Klonopin; and Levaquin  Home Medications   Prior to Admission medications   Medication Sig Start Date End Date Taking? Authorizing Provider  albuterol (PROVENTIL HFA;VENTOLIN HFA) 108 (90 Base) MCG/ACT inhaler Inhale 2 puffs into the lungs every 6 (six) hours as needed for wheezing or shortness of breath. 03/19/15   Shon Baton, MD  cloNIDine (CATAPRES) 0.2 MG tablet 1 tab po tid 04/18/15   Jeoffrey Massed, MD  dexamethasone (DECADRON) 4 MG tablet 1 po bid with food 05/03/15   Ivery Quale, PA-C  doxycycline (VIBRAMYCIN) 100 MG capsule Take 1 capsule (100 mg total) by mouth 2 (two) times  daily. 05/03/15   Ivery Quale, PA-C  HYDROcodone-homatropine (HYCODAN) 5-1.5 MG/5ML syrup Take 5 mLs by mouth every 6 (six) hours as needed for cough. 05/03/15   Ivery Quale, PA-C  HYDROmorphone (DILAUDID) 2 MG tablet Take by mouth 3 (three) times daily.    Historical Provider, MD  ondansetron (ZOFRAN ODT) 4 MG disintegrating tablet Take 1 tablet (4 mg total) by mouth every 8 (eight) hours as needed for nausea. 03/05/15   Tilden Fossa, MD   BP 166/97 mmHg  Pulse 80  Temp(Src) 97.9 F (36.6 C)  Resp 20  Wt 86.183 kg  SpO2 100% Physical Exam  Constitutional: She is oriented to person, place, and time. She appears well-developed and well-nourished.  Non-toxic appearance.  HENT:  Head: Normocephalic.  Right Ear: Tympanic membrane and external ear normal.  Left Ear: Tympanic membrane and external ear normal.  Moderate nasal congestion present.  Eyes: EOM and lids are normal. Pupils are equal, round, and reactive to light.  Neck: Normal range of motion. Neck supple. Carotid bruit is not present.  Cardiovascular: Normal rate, regular rhythm, normal heart sounds, intact distal pulses and normal pulses.   Pulmonary/Chest: Breath sounds normal. No respiratory distress.  Course breath sounds with scattered rhonchi present.  Abdominal: Soft. Bowel sounds are normal. There is no tenderness. There is no guarding.  Musculoskeletal:       Thoracic back: She exhibits decreased range of motion and tenderness.  Patient speaks in complete sentences without problem.  Lymphadenopathy:       Head (right side): No submandibular adenopathy present.       Head (left side): No submandibular adenopathy present.    She has no cervical adenopathy.  Neurological: She is alert and oriented to person, place, and time. She has normal strength. No cranial nerve deficit or sensory deficit.  Skin: Skin is warm and dry.  Psychiatric: She has a normal mood and affect. Her speech is normal.  Nursing note and vitals  reviewed.   ED Course  Procedures (including critical care time) Labs Review Labs Reviewed - No data to display  Imaging Review Dg Chest 2 View  05/03/2015  CLINICAL DATA:  Acute bronchitis 1 month ago. Persistent cough and mid chest pain. EXAM: CHEST  2 VIEW COMPARISON:  03/27/2015 FINDINGS: Lungs are clear without focal airspace disease or pulmonary edema. Heart and mediastinum are within normal limits. Trachea is midline. Bony thorax is intact. No large pleural effusions. IMPRESSION: No active cardiopulmonary disease. Electronically Signed   By: Richarda Overlie M.D.   On: 05/03/2015 19:05  I have personally reviewed and evaluated these images and lab results as part of my medical decision-making.   EKG Interpretation None      MDM  Pulse oximetry is 100% on room air. Chest x-ray is negative for acute cardiopulmonary disease. Patient speaks in complete sentences without problem.   The exam favors bronchitis and upper respiratory infection. The patient is treated with doxycycline, Decadron, and Hycodan for cough. The patient is also treated with Afrin spray, 2 sprays in each nostril every 8 hours for 5 days only. The patient acknowledges understanding of the instructions and the exam and test results. She is in agreement with this discharge plan.    Final diagnoses:  Bronchitis  URI (upper respiratory infection)    **I have reviewed nursing notes, vital signs, and all appropriate lab and imaging results for this patient.    Ivery QualeHobson Rivky Clendenning, PA-C 05/05/15 1659  Cathren LaineKevin Steinl, MD 05/05/15 [phone]

## 2015-05-09 ENCOUNTER — Ambulatory Visit (INDEPENDENT_AMBULATORY_CARE_PROVIDER_SITE_OTHER): Payer: Medicare Other | Admitting: Physician Assistant

## 2015-05-09 ENCOUNTER — Telehealth: Payer: Self-pay | Admitting: *Deleted

## 2015-05-09 ENCOUNTER — Ambulatory Visit (HOSPITAL_BASED_OUTPATIENT_CLINIC_OR_DEPARTMENT_OTHER)
Admission: RE | Admit: 2015-05-09 | Discharge: 2015-05-09 | Disposition: A | Discharge status: Home / Self Care | Payer: Medicare Other | Source: Ambulatory Visit | Attending: Physician Assistant | Admitting: Physician Assistant

## 2015-05-09 ENCOUNTER — Encounter: Payer: Self-pay | Admitting: Physician Assistant

## 2015-05-09 VITALS — BP 146/98 | HR 90 | Temp 98.3°F | Resp 16 | Ht 63.0 in | Wt 188.4 lb

## 2015-05-09 DIAGNOSIS — G500 Trigeminal neuralgia: Secondary | ICD-10-CM

## 2015-05-09 DIAGNOSIS — H5712 Ocular pain, left eye: Secondary | ICD-10-CM | POA: Insufficient documentation

## 2015-05-09 DIAGNOSIS — R0789 Other chest pain: Secondary | ICD-10-CM

## 2015-05-09 MED ORDER — BACLOFEN 10 MG PO TABS: 10.0000 mg | ORAL_TABLET | Freq: Two times a day (BID) | ORAL | Status: DC

## 2015-05-09 NOTE — Telephone Encounter (Signed)
Called pt for triage per request of Malva CoganCody Martin, PA-C. No answer, LMOM to return call. If pt calls back, can transfer to [facility] or Clinic RN.

## 2015-05-09 NOTE — Progress Notes (Signed)
Patient with history of RSD of left upper extremity presents to clinic today c/o a couple of episodes of a stabbing pain in left cheek and eye over the past few days. Endorses sensation of numbness in the area. Denies vision changes. Denies tingling of face or rash. Endorses spells last about 1 minutes and are most intense when they first start. Denies history of CVA or MI. Patient denies chest pain, palpitations, lightheadedness, dizziness, vision changes or frequent headaches. Endorses some chest tightness today but feels it is related to anxiety over current symptoms. Has not taken anything for symptoms other than her chronic Dilaudid for RSD.[name]  Past Medical History  Diagnosis Date  . RSD (reflex sympathetic dystrophy)     a. L shoulder--pain mgmt with Dr. Emmaline Kluver at [facility] [place]  . Migraine   . Colon polyps     a. s/p colonoscopy and polypectomy in 2003  . Abdominal pain, chronic, left lower quadrant     a. s/p Laproscopy in 2004 w/o finding of source of discomfort.  [name] GERD (gastroesophageal reflux disease)   . Hiatal hernia     a. small by EGD 2006.  [name] Visual disturbance     a. 12/2011 Left eye visual changes->w/u for TIA->Carotid U/S:  No ICA stenosis; Head CT: No acute abnl; MRI/MRA: mild RPCA stenosis; Echo: EF 55-60%, Triv AI, No PFO.  . Tobacco abuse     a. 50 pack year hx.  . Cholelithiasis     a. s/p cholecystectomy 10/2011  . Chest pain     a. reportedly nl MV and echo 10/2011 - [place]  . Diverticulosis     with ? of 'itis  . Hypertension   . Family history of colon cancer     mother  . History of amaurosis fugax 01/2011    L eye; saw neuro at [place] (Dr. Tonia Ghent).  Lab w/u and carotid doppler nl per old records.  . Anxiety and depression 2013    Lots of stress  . Hyperlipidemia     LDL 191 in 2013 per old records    Current Outpatient Prescriptions on File Prior to Visit  Medication Sig Dispense Refill   . albuterol (PROVENTIL HFA;VENTOLIN HFA) 108 (90 Base) MCG/ACT inhaler Inhale 2 puffs into the lungs every 6 (six) hours as needed for wheezing or shortness of breath. 1 Inhaler 0  . cloNIDine (CATAPRES) 0.2 MG tablet 1 tab po tid 270 tablet 3  . HYDROmorphone (DILAUDID) 2 MG tablet Take by mouth 3 (three) times daily.    . ondansetron (ZOFRAN ODT) 4 MG disintegrating tablet Take 1 tablet (4 mg total) by mouth every 8 (eight) hours as needed for nausea. 12 tablet 0   No current facility-administered medications on file prior to visit.    Allergies  Allergen Reactions  . Flagyl [Metronidazole] Nausea And Vomiting    Oral form only, IV pt can tolerate  . Elavil [Amitriptyline]     hallucinations  . Klonopin [Clonazepam]     Hallucinations   . Levaquin [Levofloxacin Hemihydrate] Hives    Family History  Problem Relation Age of Onset  . Dementia Father   . Parkinson's disease Father     d. 2013  . Hypertension Father   . Hyperlipidemia Father   . Heart disease Father   . Heart attack Mother     died @ 28  . Colon cancer Mother   . Hyperlipidemia Mother   . Heart disease Mother   .  Hypertension Mother   . Esophageal cancer Brother   . Diabetes Neg Hx     Social History   Social History  . Marital Status: Divorced    Spouse Name: N/A  . Number of Children: N/A  . Years of Education: N/A   Social History Main Topics  . Smoking status: Current Every Day Smoker -- 0.50 packs/day for 30 years    Types: Cigarettes  . Smokeless tobacco: Never Used  . Alcohol Use: No  . Drug Use: No  . Sexual Activity: Not Asked   Other Topics Concern  . None   Social History Narrative   Divorced, 1 son, 1 granddaughter.   Relocated to [place] from [place] in 04/2014 to live with her boyfriend.     Had been walking 3 miles on a treadmill most days of the week until late October when she had cholecystectomy.   Tob 30 pack-yr hx.   Alc: none   No hx of substance abuse.     Review of Systems - See HPI.  All other ROS are negative.  BP 164/100 mmHg  Pulse 111  Temp(Src) 98.3 F (36.8 C) (Oral)  Resp 16  Ht 5' 3"  (1.6 m)  Wt 188 lb 6 oz (85.446 kg)  BMI 33.38 kg/m2  SpO2 98%  Physical Exam  Constitutional: She is oriented to person, place, and time and well-developed, well-nourished, and in no distress.  HENT:  Head: Normocephalic and atraumatic.  Right Ear: External ear normal.  Left Ear: External ear normal.  Nose: Nose normal.  Mouth/Throat: Oropharynx is clear and moist. No oropharyngeal exudate.  Eyes: Conjunctivae and EOM are normal. Pupils are equal, round, and reactive to light.  Neck: Neck supple.  Cardiovascular: Regular rhythm, normal heart sounds and intact distal pulses.   Pulmonary/Chest: Effort normal and breath sounds normal. No respiratory distress. She has no wheezes. She has no rales. She exhibits no tenderness.  Neurological: She is alert and oriented to person, place, and time. She has normal motor skills, normal strength, normal reflexes and intact cranial nerves. She is not agitated and not disoriented. She displays no tremor, facial symmetry, normal speech and normal reflexes. A sensory deficit is present. She has a normal Cerebellar Exam and a normal Tandem Gait Test.  There is decreased sensation of 1st branch of left trigeminal nerve on exam. Patient has hard time distinguishing between soft and sharp sensations in this area. Otherwise sensory exam of face within normal limits.   Skin: Skin is warm and dry. No rash noted.  Psychiatric: Affect normal.  Vitals reviewed.   Recent Results (from the past 2160 hour(s))  Urinalysis, Routine w reflex microscopic (not at [facility])     Status: Abnormal   Collection Time: 03/05/15  5:24 PM  Result Value Ref Range   Color, Urine YELLOW YELLOW   APPearance CLEAR CLEAR   Specific Gravity, Urine 1.022 1.005 - 1.030   pH 6.5 5.0 - 8.0   Glucose, UA NEGATIVE NEGATIVE mg/dL   Hgb urine  dipstick NEGATIVE NEGATIVE   Bilirubin Urine SMALL (A) NEGATIVE   Ketones, ur 40 (A) NEGATIVE mg/dL   Protein, ur NEGATIVE NEGATIVE mg/dL   Nitrite NEGATIVE NEGATIVE   Leukocytes, UA NEGATIVE NEGATIVE    Comment: MICROSCOPIC NOT DONE ON URINES WITH NEGATIVE PROTEIN, BLOOD, LEUKOCYTES, NITRITE, OR GLUCOSE <1000 mg/dL.  Lipase, blood     Status: None   Collection Time: 03/05/15  5:40 PM  Result Value Ref Range   Lipase 18  11 - 51 U/L  Comprehensive metabolic panel     Status: Abnormal   Collection Time: 03/05/15  5:40 PM  Result Value Ref Range   Sodium 137 135 - 145 mmol/L   Potassium 3.9 3.5 - 5.1 mmol/L   Chloride 102 101 - 111 mmol/L   CO2 24 22 - 32 mmol/L   Glucose, Bld 132 (H) 65 - 99 mg/dL   BUN 15 6 - 20 mg/dL   Creatinine, Ser 0.86 0.44 - 1.00 mg/dL   Calcium 9.2 8.9 - 10.3 mg/dL   Total Protein 8.4 (H) 6.5 - 8.1 g/dL   Albumin 4.2 3.5 - 5.0 g/dL   AST 15 15 - 41 U/L   ALT 13 (L) 14 - 54 U/L   Alkaline Phosphatase 90 38 - 126 U/L   Total Bilirubin 0.7 0.3 - 1.2 mg/dL   GFR calc non Af Amer >60 >60 mL/min   GFR calc Af Amer >60 >60 mL/min    Comment: (NOTE) The eGFR has been calculated using the CKD EPI equation. This calculation has not been validated in all clinical situations. eGFR's persistently <60 mL/min signify possible Chronic Kidney Disease.    Anion gap 11 5 - 15  CBC     Status: Abnormal   Collection Time: 03/05/15  5:40 PM  Result Value Ref Range   WBC 15.9 (H) 4.0 - 10.5 K/uL   RBC 4.92 3.87 - 5.11 MIL/uL   Hemoglobin 15.8 (H) 12.0 - 15.0 g/dL   HCT 44.4 36.0 - 46.0 %   MCV 90.2 78.0 - 100.0 fL   MCH 32.1 26.0 - 34.0 pg   MCHC 35.6 30.0 - 36.0 g/dL   RDW 13.6 11.5 - 15.5 %   Platelets 321 150 - 400 K/uL  Basic metabolic panel     Status: Abnormal   Collection Time: 03/18/15 10:37 PM  Result Value Ref Range   Sodium 136 135 - 145 mmol/L   Potassium 4.2 3.5 - 5.1 mmol/L   Chloride 103 101 - 111 mmol/L   CO2 23 22 - 32 mmol/L   Glucose, Bld 102  (H) 65 - 99 mg/dL   BUN 20 6 - 20 mg/dL   Creatinine, Ser 0.71 0.44 - 1.00 mg/dL   Calcium 8.6 (L) 8.9 - 10.3 mg/dL   GFR calc non Af Amer >60 >60 mL/min   GFR calc Af Amer >60 >60 mL/min    Comment: (NOTE) The eGFR has been calculated using the CKD EPI equation. This calculation has not been validated in all clinical situations. eGFR's persistently <60 mL/min signify possible Chronic Kidney Disease.    Anion gap 10 5 - 15  CBC     Status: None   Collection Time: 03/18/15 10:37 PM  Result Value Ref Range   WBC 7.4 4.0 - 10.5 K/uL   RBC 4.52 3.87 - 5.11 MIL/uL   Hemoglobin 14.1 12.0 - 15.0 g/dL   HCT 41.7 36.0 - 46.0 %   MCV 92.3 78.0 - 100.0 fL   MCH 31.2 26.0 - 34.0 pg   MCHC 33.8 30.0 - 36.0 g/dL   RDW 13.7 11.5 - 15.5 %   Platelets 265 150 - 400 K/uL  Troponin I     Status: None   Collection Time: 03/18/15 10:37 PM  Result Value Ref Range   Troponin I <0.03 <0.031 ng/mL    Comment:        NO INDICATION OF MYOCARDIAL INJURY.   Troponin I  Status: None   Collection Time: 03/19/15  3:30 AM  Result Value Ref Range   Troponin I <0.03 <0.031 ng/mL    Comment:        NO INDICATION OF MYOCARDIAL INJURY.    Assessment/Plan: 1. Chest pressure EKG with NSR. This is mainly related to patient anxiety over her presenting complaints. Patient asymptomatic at time of exam with improvement in HR. Endorses feeling much better now that she is in the office.  - EKG 12-Lead  2. Trigeminal neuralgia of left side of face Symptoms feet Trigeminal neuralgia of the first branch of L trigeminal nerve. CT Head obtained and negative which is reassuring. Patient started on Baclofen. Continue pain medication. Instructed to FU with PCP in 2 days. If continuing, will need Neurology assessment. Discussed alarm signs/symptoms that would prompt ER assessment. Patient voiced understanding.  - CT Head Wo Contrast; Future

## 2015-05-09 NOTE — Patient Instructions (Signed)
Please go downstairs for CT scan. They will call me with your results before letting you go.  Please start the Baclofen, taking twice daily. Do not drive while taking medication. Please use ES tylenol if needed for breakthrough pain.  Follow-up with Dr. Milinda CaveMcGowen tomorrow of Thursday. If anything acutely worsens or symptoms change, please go to the ER.

## 2015-05-09 NOTE — Progress Notes (Signed)
Pre visit review using our clinic review tool, if applicable. No additional management support is needed unless otherwise documented below in the visit note/SLS  

## 2015-05-16 ENCOUNTER — Telehealth: Payer: Self-pay | Admitting: Family Medicine

## 2015-05-16 NOTE — Telephone Encounter (Signed)
Noted  

## 2015-05-16 NOTE — Telephone Encounter (Signed)
[facility]

## 2015-05-16 NOTE — Telephone Encounter (Signed)
Called patient to advise message received, will fwd to Dr. Milinda CaveMcGowen. No answer.

## 2015-05-16 NOTE — Telephone Encounter (Signed)
Called patient. Says unable to get refill on clonidine. Called pharmacy to make sure they recvd latest Rx. Pharmacy refilled med. Called patient back to notify, no answer.

## 2015-05-17 ENCOUNTER — Ambulatory Visit (INDEPENDENT_AMBULATORY_CARE_PROVIDER_SITE_OTHER): Payer: Medicare Other | Admitting: Family Medicine

## 2015-05-17 ENCOUNTER — Encounter: Payer: Self-pay | Admitting: Family Medicine

## 2015-05-17 VITALS — BP 130/88 | HR 101 | Temp 98.0°F | Ht 63.0 in | Wt 190.0 lb

## 2015-05-17 DIAGNOSIS — R202 Paresthesia of skin: Secondary | ICD-10-CM | POA: Diagnosis not present

## 2015-05-17 DIAGNOSIS — H5712 Ocular pain, left eye: Secondary | ICD-10-CM

## 2015-05-17 DIAGNOSIS — H53132 Sudden visual loss, left eye: Secondary | ICD-10-CM

## 2015-05-17 LAB — CBC WITH DIFFERENTIAL/PLATELET
Basophils Absolute: 0.3 K/uL — ABNORMAL HIGH (ref 0.0–0.1)
Basophils Relative: 2.3 % (ref 0.0–3.0)
Eosinophils Absolute: 0.3 K/uL (ref 0.0–0.7)
Eosinophils Relative: 2.7 % (ref 0.0–5.0)
HCT: 41.2 % (ref 36.0–46.0)
Hemoglobin: 14.2 g/dL (ref 12.0–15.0)
Lymphocytes Relative: 40.4 % (ref 12.0–46.0)
Lymphs Abs: 4.5 K/uL — ABNORMAL HIGH (ref 0.7–4.0)
MCHC: 34.5 g/dL (ref 30.0–36.0)
MCV: 91.1 fl (ref 78.0–100.0)
Monocytes Absolute: 0.5 K/uL (ref 0.1–1.0)
Monocytes Relative: 4.9 % (ref 3.0–12.0)
Neutro Abs: 5.5 K/uL (ref 1.4–7.7)
Neutrophils Relative %: 49.7 % (ref 43.0–77.0)
Platelets: 300 K/uL (ref 150.0–400.0)
RBC: 4.52 Mil/uL (ref 3.87–5.11)
RDW: 14.5 % (ref 11.5–15.5)
WBC: 11.1 K/uL — ABNORMAL HIGH (ref 4.0–10.5)

## 2015-05-17 LAB — SEDIMENTATION RATE: SED RATE: 23 mm/h — AB (ref 0–22)

## 2015-05-17 MED ORDER — PREDNISONE 20 MG PO TABS
ORAL_TABLET | ORAL | Status: DC
Start: 2015-05-17 — End: 2015-10-29

## 2015-05-17 NOTE — Progress Notes (Signed)
Pre visit review using our clinic review tool, if applicable. No additional management support is needed unless otherwise documented below in the visit note. 

## 2015-05-17 NOTE — Progress Notes (Signed)
OFFICE VISIT  05/17/2015   CC:  Chief Complaint  Patient presents with  . Follow-up    pain behind left eye radiating l side of face   HPI:    Patient is a 50 y.o. Caucasian female who presents for f/u from 05/09/15 visit at which time she was dx'd with trigeminal neuralgia on L side. Head CT was done and was normal.  She was rx'd baclofen at that time and she could not tolerate this med--made her feel "drunk". She has hx of "complex" migraines consisting of L sided HA and L eye complete loss of vision. She presented on 05/09/15 b/c of recurrent L eye blindness and severe pain behind the left eye.  No eye lacrimation, she does not know if it drooped or not.  She doesn't know about any pupillary changes.   Former neurologist: Dr. Rollene Rotunda in [place] not seen him in 4 yrs.  She states she was put on a bp med (metoprolol) by him and this led to a 4 yr HA-free period.  She denies ever being rx'd triptans.   Now, over the last 2 weeks she is noting these episodes of loss of vision followed by eye pain occur almost daily.  She also feels numbness in L zygomatic region and L peri-oral region when she has these episodes. Has some dizziness at times with these episodes.  No syncope.  No loss of bladder/bowel.  No loss of consciousness noted by pt or others.  Past Medical History  Diagnosis Date  . RSD (reflex sympathetic dystrophy)     a. L shoulder--pain mgmt with Dr. Emmaline Kluver at [facility] in [place]  . Migraine   . Colon polyps     a. s/p colonoscopy and polypectomy in 2003  . Abdominal pain, chronic, left lower quadrant     a. s/p Laproscopy in 2004 w/o finding of source of discomfort.  [name] GERD (gastroesophageal reflux disease)   . Hiatal hernia     a. small by EGD 2006.  [name] Visual disturbance     a. 12/2011 Left eye visual changes->w/u for TIA->Carotid U/S:  No ICA stenosis; Head CT: No acute abnl; MRI/MRA: mild RPCA stenosis; Echo: EF 55-60%, Triv AI, No PFO.  . Tobacco  abuse     a. 50 pack year hx.  . Cholelithiasis     a. s/p cholecystectomy 10/2011  . Chest pain     a. reportedly nl MV and echo 10/2011 - [place]  . Diverticulosis     with ? of 'itis  . Hypertension   . Family history of colon cancer     mother  . History of amaurosis fugax 01/2011    L eye; saw neuro at [place] (Dr. Tonia Ghent).  Lab w/u and carotid doppler nl per old records.  . Anxiety and depression 2013    Lots of stress  . Hyperlipidemia     LDL 191 in 2013 per old records    Past Surgical History  Procedure Laterality Date  . Cesarean section  1990  . Shoulder surgery Left 1997 &98  . Laparoscopic cholecystectomy  10/19/11  . Umbilical hernia repair  2014    with mesh  . Laparoscopy  05/2002    Dr. Lenda Kelp cause for LLQ abd pain found  . Endometrial ablation  2011  . Colonoscopy  10/2001    Dr. Watt Climes; diverticulosis, int/ext hem, one small polyp removed.  . Esophagogastroduodenoscopy  02/2004    Dr. Watt Climes; normal  Outpatient Prescriptions Prior to Visit  Medication Sig Dispense Refill  . albuterol (PROVENTIL HFA;VENTOLIN HFA) 108 (90 Base) MCG/ACT inhaler Inhale 2 puffs into the lungs every 6 (six) hours as needed for wheezing or shortness of breath. 1 Inhaler 0  . cloNIDine (CATAPRES) 0.2 MG tablet 1 tab po tid 270 tablet 3  . HYDROmorphone (DILAUDID) 2 MG tablet Take by mouth 3 (three) times daily.    . ondansetron (ZOFRAN ODT) 4 MG disintegrating tablet Take 1 tablet (4 mg total) by mouth every 8 (eight) hours as needed for nausea. 12 tablet 0  . baclofen (LIORESAL) 10 MG tablet Take 1 tablet (10 mg total) by mouth 2 (two) times daily. 30 each 0   No facility-administered medications prior to visit.    Allergies  Allergen Reactions  . Flagyl [Metronidazole] Nausea And Vomiting    Oral form only, IV pt can tolerate  . Elavil [Amitriptyline]     hallucinations  . Klonopin [Clonazepam]     Hallucinations    . Levaquin [Levofloxacin Hemihydrate] Hives    ROS As per HPI  PE: Blood pressure 130/88, pulse 101, temperature 98 F (36.7 C), temperature source Oral, height 5' 3" (1.6 m), weight 190 lb (86.183 kg), SpO2 98 %. Gen: Alert, well appearing.  Patient is oriented to person, place, time, and situation. AFFECT: pleasant, lucid thought and speech. Facial features symmetric.  PERRL, EOMI. L temporal artery area is mildly tender to palpation, something which patient says she had not realized before. Vision is grossly normal. Neuro: CN 2-12 intact bilaterally CV: RRR, no m/r/g.   LUNGS: CTA bilat, nonlabored resps, good aeration in all lung fields.   LABS:    Chemistry      Component Value Date/Time   NA 136 03/18/2015 2237   K 4.2 03/18/2015 2237   CL 103 03/18/2015 2237   CO2 23 03/18/2015 2237   BUN 20 03/18/2015 2237   CREATININE 0.71 03/18/2015 2237      Component Value Date/Time   CALCIUM 8.6* 03/18/2015 2237   ALKPHOS 90 03/05/2015 1740   AST 15 03/05/2015 1740   ALT 13* 03/05/2015 1740   BILITOT 0.7 03/05/2015 1740     Lab Results  Component Value Date   WBC 7.4 03/18/2015   HGB 14.1 03/18/2015   HCT 41.7 03/18/2015   MCV 92.3 03/18/2015   PLT 265 03/18/2015   IMPRESSION AND PLAN:  Episodic left orbital pain with subsequent transient loss of vision in left eye. Noted past history of similar, w/u unrevealing by neurologist in 2013. Recently a head CT was normal.  Today I have enough suspicion of temporal arteritis to start prednisone treatment and check ESR and CBC. However, she has symptoms that don't fit this dx (orbital rather than temporal HA), plus there's the past history of similar HA syndrome that seemed to respond excellently to metoprolol in the past per pt. I need help with this one, so I have referred her to [facility] neurology, 1st available provider.  An After Visit Summary was printed and given to the patient.  FOLLOW UP: Return for f/u to be  determined based on results of w/u/referral.  Signed:  Crissie Sickles, MD           05/17/2015

## 2015-05-18 ENCOUNTER — Telehealth: Payer: Self-pay

## 2015-05-18 NOTE — Telephone Encounter (Signed)
Called patient. Gave lab results. Patient verbalized understanding.  

## 2015-05-18 NOTE — Telephone Encounter (Signed)
-----   Message from Jeoffrey MassedPhilip H McGowen, MD sent at 05/17/2015  4:49 PM EDT ----- Blood tests came back normal.-thx

## 2015-05-30 ENCOUNTER — Emergency Department (HOSPITAL_BASED_OUTPATIENT_CLINIC_OR_DEPARTMENT_OTHER)
Admission: EM | Admit: 2015-05-30 | Discharge: 2015-05-30 | Disposition: A | Discharge status: Home / Self Care | Payer: Medicare Other | Attending: Emergency Medicine | Admitting: Emergency Medicine

## 2015-05-30 ENCOUNTER — Emergency Department (HOSPITAL_BASED_OUTPATIENT_CLINIC_OR_DEPARTMENT_OTHER): Payer: Medicare Other

## 2015-05-30 ENCOUNTER — Encounter (HOSPITAL_BASED_OUTPATIENT_CLINIC_OR_DEPARTMENT_OTHER): Payer: Self-pay | Admitting: Emergency Medicine

## 2015-05-30 DIAGNOSIS — R062 Wheezing: Secondary | ICD-10-CM | POA: Insufficient documentation

## 2015-05-30 DIAGNOSIS — E785 Hyperlipidemia, unspecified: Secondary | ICD-10-CM | POA: Insufficient documentation

## 2015-05-30 DIAGNOSIS — F329 Major depressive disorder, single episode, unspecified: Secondary | ICD-10-CM | POA: Diagnosis not present

## 2015-05-30 DIAGNOSIS — G508 Other disorders of trigeminal nerve: Secondary | ICD-10-CM | POA: Diagnosis not present

## 2015-05-30 DIAGNOSIS — G500 Trigeminal neuralgia: Secondary | ICD-10-CM

## 2015-05-30 DIAGNOSIS — R61 Generalized hyperhidrosis: Secondary | ICD-10-CM | POA: Insufficient documentation

## 2015-05-30 DIAGNOSIS — I10 Essential (primary) hypertension: Secondary | ICD-10-CM | POA: Insufficient documentation

## 2015-05-30 DIAGNOSIS — R079 Chest pain, unspecified: Secondary | ICD-10-CM

## 2015-05-30 DIAGNOSIS — Z79899 Other long term (current) drug therapy: Secondary | ICD-10-CM | POA: Insufficient documentation

## 2015-05-30 DIAGNOSIS — F17210 Nicotine dependence, cigarettes, uncomplicated: Secondary | ICD-10-CM | POA: Diagnosis not present

## 2015-05-30 DIAGNOSIS — R05 Cough: Secondary | ICD-10-CM | POA: Diagnosis not present

## 2015-05-30 LAB — CBC
HEMATOCRIT: 43.8 % (ref 36.0–46.0)
Hemoglobin: 15.2 g/dL — ABNORMAL HIGH (ref 12.0–15.0)
MCH: 31.5 pg (ref 26.0–34.0)
MCHC: 34.7 g/dL (ref 30.0–36.0)
MCV: 90.7 fL (ref 78.0–100.0)
Platelets: 346 10*3/uL (ref 150–400)
RBC: 4.83 MIL/uL (ref 3.87–5.11)
RDW: 14.6 % (ref 11.5–15.5)
WBC: 10 10*3/uL (ref 4.0–10.5)

## 2015-05-30 LAB — BASIC METABOLIC PANEL
Anion gap: 9 (ref 5–15)
BUN: 9 mg/dL (ref 6–20)
CO2: 24 mmol/L (ref 22–32)
Calcium: 9.4 mg/dL (ref 8.9–10.3)
Chloride: 105 mmol/L (ref 101–111)
Creatinine, Ser: 0.71 mg/dL (ref 0.44–1.00)
GFR calc Af Amer: >60 mL/min (ref >60)
GLUCOSE: 101 mg/dL — AB (ref 65–99)
POTASSIUM: 3.8 mmol/L (ref 3.5–5.1)
Sodium: 138 mmol/L (ref 135–145)

## 2015-05-30 LAB — TROPONIN I
TROPONIN I: 0.03 ng/mL (ref <0.031)
Troponin I: 0.04 ng/mL — ABNORMAL HIGH (ref <0.031)

## 2015-05-30 MED ORDER — HYDROMORPHONE HCL 2 MG PO TABS
2.0000 mg | ORAL_TABLET | Freq: Once | ORAL | Status: DC
  Filled 2015-05-30: qty 1

## 2015-05-30 MED ORDER — ONDANSETRON HCL 4 MG/2ML IJ SOLN
4.0000 mg | Freq: Once | INTRAMUSCULAR | Status: AC
  Administered 2015-05-30: 4 mg via INTRAVENOUS
  Filled 2015-05-30: qty 2

## 2015-05-30 MED ORDER — CLONIDINE HCL 0.1 MG PO TABS
0.2000 mg | ORAL_TABLET | Freq: Once | ORAL | Status: AC
  Administered 2015-05-30: 0.2 mg via ORAL
  Filled 2015-05-30: qty 2

## 2015-05-30 MED ORDER — ASPIRIN 81 MG PO CHEW
324.0000 mg | CHEWABLE_TABLET | Freq: Once | ORAL | Status: AC
  Administered 2015-05-30: 324 mg via ORAL
  Filled 2015-05-30: qty 4

## 2015-05-30 MED ORDER — NITROGLYCERIN 0.4 MG SL SUBL
0.4000 mg | SUBLINGUAL_TABLET | SUBLINGUAL | Status: DC | PRN
  Administered 2015-05-30: 0.4 mg via SUBLINGUAL
  Filled 2015-05-30: qty 1

## 2015-05-30 NOTE — ED Provider Notes (Signed)
Received Patient from Dr. 3 PM. Please see his history and physical for cardiac. Briefly this is a 50 year old female with a history of smoking who presents with chest pain.  Initially on Dr. Garlan FillersKnott's evaluation, she had reported constant pain over weeks, with atypical features and while initial troponin was mildly positive, it was in setting of atyipical symptoms with plan to trend value and discharge.  On my evaluation, patient describes atypical type chest pain, and when calculating A heart score, based off of historical factors it is borderline. Patient reports that chest pain began last night, describes it as a pressure radiating to her back, with associated diaphoresis and nausea. 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 At this time, patient's blood pressures have decreased, however she reports ongoing chest pain. Patient's initial history was not concerning for anginal equivalent, the history she provides to me at this time and continuing chest pain is concerning for possible anginal equivalent.  Discussed different options with patient including observation versus discharge and patient reports she perfers discharge after further discussion with husband.  They will follow up closely with PCP and return if symptoms worsen.  Chest pain resolved at this time.  Second troponin negative and doubt ACS and feel initial value was likely laboratory abnormality.  Patient discharged in stable condition with understanding of reasons to return.   Alvira MondayErin Pharell Rolfson, MD 05/31/15 0040

## 2015-05-30 NOTE — ED Notes (Addendum)
Pt in c/o L sided chest, arm and back pain that is sharp and woke her up out of sleep last night. 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 Pt with hx of RSD with chronic L arm pain. Pt is alert, interactive, tearful, and in NAD.

## 2015-05-30 NOTE — Discharge Instructions (Signed)
Nonspecific Chest Pain  °Chest pain can be caused by many different conditions. There is always a chance that your pain could be related to something serious, such as a heart attack or a blood clot in your lungs. Chest pain can also be caused by conditions that are not life-threatening. If you have chest pain, it is very important to follow up with your health care provider. °CAUSES  °Chest pain can be caused by: °· Heartburn. °· Pneumonia or bronchitis. °· Anxiety or stress. °· Inflammation around your heart (pericarditis) or lung (pleuritis or pleurisy). °· A blood clot in your lung. °· A collapsed lung (pneumothorax). It can develop suddenly on its own (spontaneous pneumothorax) or from trauma to the chest. °· Shingles infection (varicella-zoster virus). °· Heart attack. °· Damage to the bones, muscles, and cartilage that make up your chest wall. This can include: °¨ Bruised bones due to injury. °¨ Strained muscles or cartilage due to frequent or repeated coughing or overwork. °¨ Fracture to one or more ribs. °¨ Sore cartilage due to inflammation (costochondritis). °RISK FACTORS  °Risk factors for chest pain may include: °· Activities that increase your risk for trauma or injury to your chest. °· Respiratory infections or conditions that cause frequent coughing. °· Medical conditions or overeating that can cause heartburn. °· Heart disease or family history of heart disease. °· Conditions or health behaviors that increase your risk of developing a blood clot. °· Having had chicken pox (varicella zoster). °SIGNS AND SYMPTOMS °Chest pain can feel like: °· Burning or tingling on the surface of your chest or deep in your chest. °· Crushing, pressure, aching, or squeezing pain. °· Dull or sharp pain that is worse when you move, cough, or take a deep breath. °· Pain that is also felt in your back, neck, shoulder, or arm, or pain that spreads to any of these areas. °Your chest pain may come and go, or it may stay  constant. °DIAGNOSIS °Lab tests or other studies may be needed to find the cause of your pain. Your health care provider may have you take a test called an ambulatory ECG (electrocardiogram). An ECG records your heartbeat patterns at the time the test is performed. You may also have other tests, such as: °· Transthoracic echocardiogram (TTE). During echocardiography, sound waves are used to create a picture of all of the heart structures and to look at how blood flows through your heart. °· Transesophageal echocardiogram (TEE). This is a more advanced imaging test that obtains images from inside your body. It allows your health care provider to see your heart in finer detail. °· Cardiac monitoring. This allows your health care provider to monitor your heart rate and rhythm in real time. °· Holter monitor. This is a portable device that records your heartbeat and can help to diagnose abnormal heartbeats. It allows your health care provider to track your heart activity for several days, if needed. °· Stress tests. These can be done through exercise or by taking medicine that makes your heart beat more quickly. °· Blood tests. °· Imaging tests. °TREATMENT  °Your treatment depends on what is causing your chest pain. Treatment may include: °· Medicines. These may include: °¨ Acid blockers for heartburn. °¨ Anti-inflammatory medicine. °¨ Pain medicine for inflammatory conditions. °¨ Antibiotic medicine, if an infection is present. °¨ Medicines to dissolve blood clots. °¨ Medicines to treat coronary artery disease. °· Supportive care for conditions that do not require medicines. This may include: °¨ Resting. °¨ Applying heat   or cold packs to injured areas. °¨ Limiting activities until pain decreases. °HOME CARE INSTRUCTIONS °· If you were prescribed an antibiotic medicine, finish it all even if you start to feel better. °· Avoid any activities that bring on chest pain. °· Do not use any tobacco products, including  cigarettes, chewing tobacco, or electronic cigarettes. If you need help quitting, ask your health care provider. °· Do not drink alcohol. °· Take medicines only as directed by your health care provider. °· Keep all follow-up visits as directed by your health care provider. This is important. This includes any further testing if your chest pain does not go away. °· If heartburn is the cause for your chest pain, you may be told to keep your head raised (elevated) while sleeping. This reduces the chance that acid will go from your stomach into your esophagus. °· Make lifestyle changes as directed by your health care provider. These may include: °¨ Getting regular exercise. Ask your health care provider to suggest some activities that are safe for you. °¨ Eating a heart-healthy diet. A registered dietitian can help you to learn healthy eating options. °¨ Maintaining a healthy weight. °¨ Managing diabetes, if necessary. °¨ Reducing stress. °SEEK MEDICAL CARE IF: °· Your chest pain does not go away after treatment. °· You have a rash with blisters on your chest. °· You have a fever. °SEEK IMMEDIATE MEDICAL CARE IF:  °· Your chest pain is worse. °· You have an increasing cough, or you cough up blood. °· You have severe abdominal pain. °· You have severe weakness. °· You faint. °· You have chills. °· You have sudden, unexplained chest discomfort. °· You have sudden, unexplained discomfort in your arms, back, neck, or jaw. °· You have shortness of breath at any time. °· You suddenly start to sweat, or your skin gets clammy. °· You feel nauseous or you vomit. °· You suddenly feel light-headed or dizzy. °· Your heart begins to beat quickly, or it feels like it is skipping beats. °These symptoms may represent a serious problem that is an emergency. Do not wait to see if the symptoms will go away. Get medical help right away. Call your local emergency services (911 in the [place] Do not drive yourself to the hospital. °  °This  information is not intended to replace advice given to you by your health care provider. Make sure you discuss any questions you have with your health care provider. °  °Document Released: 10/03/2004 Document Revised: 01/14/2014 Document Reviewed: 07/30/2013 °Elsevier Interactive Patient Education ©2016 [employer] ° °

## 2015-05-30 NOTE — ED Provider Notes (Signed)
CSN: [id number]     Arrival date & time 05/30/15  1157 History   First MD Initiated Contact with Patient 05/30/15 1206     Chief Complaint  Patient presents with  . Chest Pain  . Arm Pain     (Consider location/radiation/quality/duration/timing/severity/associated sxs/prior Treatment) Patient is a 50 y.o. female presenting with chest pain and arm pain. The history is provided by the patient.  Chest Pain Pain location:  L chest Pain quality: burning, sharp and stabbing   Pain radiates to:  Does not radiate Pain radiates to the back: no   Pain severity:  Moderate Onset quality:  Gradual Duration:  2 weeks Timing:  Constant Progression:  Unchanged Chronicity:  Recurrent Relieved by: narcotic pain meds. Worsened by:  Nothing tried Ineffective treatments:  None tried Associated symptoms: diaphoresis   Associated symptoms: no fever, no shortness of breath and not vomiting   Risk factors: hypertension (chronic, on clonidine) and obesity   Arm Pain Associated symptoms include chest pain. Pertinent negatives include no shortness of breath.    Past Medical History  Diagnosis Date  . RSD (reflex sympathetic dystrophy)     a. L shoulder--pain mgmt with Dr. Marlynn Perking at [facility] in [place]  . Migraine   . Colon polyps     a. s/p colonoscopy and polypectomy in 2003  . Abdominal pain, chronic, left lower quadrant     a. s/p Laproscopy in 2004 w/o finding of source of discomfort.  [name] GERD (gastroesophageal reflux disease)   . Hiatal hernia     a. small by EGD 2006.  [name] Visual disturbance     a. 12/2011 Left eye visual changes->w/u for TIA->Carotid U/S:  No ICA stenosis; Head CT: No acute abnl; MRI/MRA: mild RPCA stenosis; Echo: EF 55-60%, Triv AI, No PFO.  . Tobacco abuse     a. 50 pack year hx.  . Cholelithiasis     a. s/p cholecystectomy 10/2011  . Chest pain     a. reportedly nl MV and echo 10/2011 - [place]  . Diverticulosis     with ? of 'itis  . Hypertension    . Family history of colon cancer     mother  . History of amaurosis fugax 01/2011    L eye; saw neuro at [facility] (Dr. Johnell Comings).  Lab w/u and carotid doppler nl per old records.  . Anxiety and depression 2013    Lots of stress  . Hyperlipidemia     LDL 191 in 2013 per old records   Past Surgical History  Procedure Laterality Date  . Cesarean section  1990  . Shoulder surgery Left 1997 &98  . Laparoscopic cholecystectomy  10/19/11  . Umbilical hernia repair  2014    with mesh  . Laparoscopy  05/2002    Dr. Harmon Dun cause for LLQ abd pain found  . Endometrial ablation  2011  . Colonoscopy  10/2001    Dr. Ewing Schlein; diverticulosis, int/ext hem, one small polyp removed.  . Esophagogastroduodenoscopy  02/2004    Dr. Ewing Schlein; normal   Family History  Problem Relation Age of Onset  . Dementia Father   . Parkinson's disease Father     d. 2013  . Hypertension Father   . Hyperlipidemia Father   . Heart disease Father   . Heart attack Mother     died @ 66  . Colon cancer Mother   . Hyperlipidemia Mother   . Heart disease Mother   . Hypertension  Mother   . Esophageal cancer Brother   . Diabetes Neg Hx    Social History  Substance Use Topics  . Smoking status: Current Every Day Smoker -- 0.50 packs/day for 30 years    Types: Cigarettes  . Smokeless tobacco: Never Used  . Alcohol Use: No   OB History    No data available     Review of Systems  Constitutional: Positive for diaphoresis. Negative for fever.  Respiratory: Negative for shortness of breath.   Cardiovascular: Positive for chest pain.  Gastrointestinal: Negative for vomiting.  All other systems reviewed and are negative.     Allergies  Flagyl; Elavil; Klonopin; and Levaquin  Home Medications   Prior to Admission medications   Medication Sig Start Date End Date Taking? Authorizing Provider  albuterol (PROVENTIL HFA;VENTOLIN HFA) 108 (90 Base) MCG/ACT inhaler Inhale 2  puffs into the lungs every 6 (six) hours as needed for wheezing or shortness of breath. 03/19/15   Shon Baton, MD  cloNIDine (CATAPRES) 0.2 MG tablet 1 tab po tid 04/18/15   Jeoffrey Massed, MD  HYDROmorphone (DILAUDID) 2 MG tablet Take by mouth 3 (three) times daily.    Historical Provider, MD  ondansetron (ZOFRAN ODT) 4 MG disintegrating tablet Take 1 tablet (4 mg total) by mouth every 8 (eight) hours as needed for nausea. 03/05/15   Tilden Fossa, MD  predniSONE (DELTASONE) 20 MG tablet 2 tabs po qd x 5d, then 1 tab po qd x 5d 05/17/15   Jeoffrey Massed, MD   There were no vitals taken for this visit. Physical Exam  Constitutional: She is oriented to person, place, and time. She appears well-developed and well-nourished. No distress.  HENT:  Head: Normocephalic.  Eyes: Conjunctivae are normal.  Neck: Neck supple. No tracheal deviation present.  Cardiovascular: Normal rate and regular rhythm.   Pulmonary/Chest: Effort normal. No respiratory distress. She has wheezes (coarse L>R). She has no rales. She exhibits tenderness (on left upper portion).  Abdominal: Soft. She exhibits no distension. There is no tenderness.  Musculoskeletal:       Left shoulder: She exhibits tenderness (over entirety of shoulder).       Cervical back: She exhibits tenderness (left lateral).       Lumbar back: She exhibits tenderness (left of midline).  Neurological: She is alert and oriented to person, place, and time.  Skin: Skin is warm and dry.  Psychiatric: She has a normal mood and affect.  Vitals reviewed.   ED Course  Procedures (including critical care time)  Emergency Focused Ultrasound Exam Limited Ocular Ultrasound   Performed and interpreted by Dr. Clydene Pugh Indication: loss of vision Linear probe used to scan in two planes to image left eye anterior and posterior chambers with measurement of optic nerve sheath at 3mm depth. Findings: no flap of tissue, no shadowing, no widening of optic  nerve sheath Interpretation: no retinal detachment, no foreign body, no increased ICP, lens normal Images archived electronically.  CPT codes: 16109 (diagnostic ocular ultrasound)  Labs Review Labs Reviewed  BASIC METABOLIC PANEL - Abnormal; Notable for the following:    Glucose, Bld 101 (*)    All other components within normal limits  CBC - Abnormal; Notable for the following:    Hemoglobin 15.2 (*)    All other components within normal limits  TROPONIN I - Abnormal; Notable for the following:    Troponin I 0.04 (*)    All other components within normal limits  Imaging Review Dg Chest 2 View  05/30/2015  CLINICAL DATA:  Cough and congestion for 2 weeks EXAM: CHEST  2 VIEW COMPARISON:  05/03/2015 FINDINGS: The heart size and mediastinal contours are within normal limits. Both lungs are clear. The visualized skeletal structures are unremarkable. IMPRESSION: No active cardiopulmonary disease. Electronically Signed   By: Alcide CleverMark  Lukens M.D.   On: 05/30/2015 13:41   I have personally reviewed and evaluated these images and lab results as part of my medical decision-making.   EKG Interpretation   Date/Time:  Tuesday May 30 2015 12:09:04 EDT Ventricular Rate:  97 PR Interval:  136 QRS Duration: 74 QT Interval:  364 QTC Calculation: 462 R Axis:   31 Text Interpretation:  Normal sinus rhythm Normal ECG No significant change  since last tracing Confirmed by Celese Banner MD, Reuel BoomANIEL ([id number]) on 05/30/2015  12:15:46 PM      MDM   Final diagnoses:  Trigeminal neuralgia of left side of face  Left sided chest pain    50 y.o. female presents with Left chest, left arm, left thigh, left lower back and left facial pain that has been intermittent over the last 3 weeks. She has a history of chronic complex regional pain syndrome and is followed by pain management on oral Dilaudid therapy. She has an appointment with them in a little over a week. She had seen her primary care physician earlier for  left eye pain with loss of vision. Bedside ultrasound here shows no retinal detachment or vitreous hemorrhage. She is pending neurology outpatient evaluation for this left eye pain and vision loss but had negative head CT performed as an outpatient. She notes that her blood pressure was very high while she [place] and [place] fire [place] she [place] to check it. While resting here it has improved greatly but goes up while she is being assessed by providers. She is only on clonidine 0.2 twice a day and could likely benefit from addition of a more stable antihypertensive on an outpatient basis.  Doubt ACS with highly atypical reproducible symptoms. First troponin was detectable so will repeat at 3 hours to ensure no elevation.    Lyndal Pulleyaniel Sybel Standish, MD 05/30/15 2135

## 2015-05-30 NOTE — ED Notes (Signed)
Pt states that she would prefer to be discharged to home if it is safe and to followup with her PMD tomorrow.  Dr. Dalene SeltzerSchlossman made aware.

## 2015-06-01 ENCOUNTER — Other Ambulatory Visit: Payer: Self-pay

## 2015-06-02 ENCOUNTER — Telehealth: Payer: Self-pay | Admitting: *Deleted

## 2015-06-02 NOTE — Telephone Encounter (Signed)
Fax rx request received for baclofen 10 mg tabs, #30. Rx denied by Piedad ClimesWilliam Cody Martin, PA-C. Denial reason: Pt needs to follow up with PCP.

## 2015-06-02 NOTE — Telephone Encounter (Signed)
[facility]ax from [facility] requesting refill for Baclofen 10mg  take on tab po BID. This is not on pts medication list. Please advise. Thanks.

## 2015-06-07 DIAGNOSIS — G500 Trigeminal neuralgia: Secondary | ICD-10-CM | POA: Diagnosis not present

## 2015-06-07 DIAGNOSIS — Z79891 Long term (current) use of opiate analgesic: Secondary | ICD-10-CM | POA: Diagnosis not present

## 2015-06-07 DIAGNOSIS — G90519 Complex regional pain syndrome I of unspecified upper limb: Secondary | ICD-10-CM | POA: Diagnosis not present

## 2015-06-07 DIAGNOSIS — G8929 Other chronic pain: Secondary | ICD-10-CM | POA: Diagnosis not present

## 2015-06-07 DIAGNOSIS — M47816 Spondylosis without myelopathy or radiculopathy, lumbar region: Secondary | ICD-10-CM | POA: Diagnosis not present

## 2015-06-19 DIAGNOSIS — G500 Trigeminal neuralgia: Secondary | ICD-10-CM | POA: Diagnosis not present

## 2015-06-19 DIAGNOSIS — H547 Unspecified visual loss: Secondary | ICD-10-CM | POA: Diagnosis not present

## 2015-06-19 DIAGNOSIS — J329 Chronic sinusitis, unspecified: Secondary | ICD-10-CM | POA: Diagnosis not present

## 2015-06-19 DIAGNOSIS — H5712 Ocular pain, left eye: Secondary | ICD-10-CM | POA: Diagnosis not present

## 2015-06-19 DIAGNOSIS — R51 Headache: Secondary | ICD-10-CM | POA: Diagnosis not present

## 2015-06-19 DIAGNOSIS — R200 Anesthesia of skin: Secondary | ICD-10-CM | POA: Diagnosis not present

## 2015-06-29 ENCOUNTER — Ambulatory Visit: Payer: Medicare Other | Admitting: Neurology

## 2015-06-29 DIAGNOSIS — Z029 Encounter for administrative examinations, unspecified: Secondary | ICD-10-CM

## 2015-07-05 DIAGNOSIS — I10 Essential (primary) hypertension: Secondary | ICD-10-CM | POA: Diagnosis not present

## 2015-07-05 DIAGNOSIS — R0789 Other chest pain: Secondary | ICD-10-CM | POA: Diagnosis not present

## 2015-07-05 DIAGNOSIS — E785 Hyperlipidemia, unspecified: Secondary | ICD-10-CM | POA: Diagnosis not present

## 2015-07-05 DIAGNOSIS — R079 Chest pain, unspecified: Secondary | ICD-10-CM | POA: Diagnosis not present

## 2015-07-05 DIAGNOSIS — Z883 Allergy status to other anti-infective agents status: Secondary | ICD-10-CM | POA: Diagnosis not present

## 2015-07-05 DIAGNOSIS — R072 Precordial pain: Secondary | ICD-10-CM | POA: Diagnosis not present

## 2015-07-05 DIAGNOSIS — F17210 Nicotine dependence, cigarettes, uncomplicated: Secondary | ICD-10-CM | POA: Diagnosis not present

## 2015-07-05 DIAGNOSIS — Z79899 Other long term (current) drug therapy: Secondary | ICD-10-CM | POA: Diagnosis not present

## 2015-07-05 DIAGNOSIS — R000 Tachycardia, unspecified: Secondary | ICD-10-CM | POA: Diagnosis not present

## 2015-07-05 DIAGNOSIS — R1110 Vomiting, unspecified: Secondary | ICD-10-CM | POA: Diagnosis not present

## 2015-07-05 DIAGNOSIS — Z888 Allergy status to other drugs, medicaments and biological substances status: Secondary | ICD-10-CM | POA: Diagnosis not present

## 2015-07-05 DIAGNOSIS — F419 Anxiety disorder, unspecified: Secondary | ICD-10-CM | POA: Diagnosis not present

## 2015-07-05 DIAGNOSIS — R1013 Epigastric pain: Secondary | ICD-10-CM | POA: Diagnosis not present

## 2015-07-06 DIAGNOSIS — F329 Major depressive disorder, single episode, unspecified: Secondary | ICD-10-CM | POA: Diagnosis not present

## 2015-07-06 DIAGNOSIS — G90519 Complex regional pain syndrome I of unspecified upper limb: Secondary | ICD-10-CM | POA: Diagnosis not present

## 2015-07-06 DIAGNOSIS — G8929 Other chronic pain: Secondary | ICD-10-CM | POA: Diagnosis not present

## 2015-07-06 DIAGNOSIS — Z79891 Long term (current) use of opiate analgesic: Secondary | ICD-10-CM | POA: Diagnosis not present

## 2015-07-06 DIAGNOSIS — M47816 Spondylosis without myelopathy or radiculopathy, lumbar region: Secondary | ICD-10-CM | POA: Diagnosis not present

## 2015-07-06 DIAGNOSIS — G500 Trigeminal neuralgia: Secondary | ICD-10-CM | POA: Diagnosis not present

## 2015-07-27 DIAGNOSIS — G90512 Complex regional pain syndrome I of left upper limb: Secondary | ICD-10-CM | POA: Diagnosis not present

## 2015-08-01 DIAGNOSIS — R1032 Left lower quadrant pain: Secondary | ICD-10-CM | POA: Diagnosis not present

## 2015-08-03 DIAGNOSIS — Z79891 Long term (current) use of opiate analgesic: Secondary | ICD-10-CM | POA: Diagnosis not present

## 2015-08-03 DIAGNOSIS — G90519 Complex regional pain syndrome I of unspecified upper limb: Secondary | ICD-10-CM | POA: Diagnosis not present

## 2015-08-03 DIAGNOSIS — G90512 Complex regional pain syndrome I of left upper limb: Secondary | ICD-10-CM | POA: Diagnosis not present

## 2015-08-10 NOTE — Telephone Encounter (Signed)
Noted.  Will call back after pt hours today.

## 2015-08-28 ENCOUNTER — Emergency Department (HOSPITAL_BASED_OUTPATIENT_CLINIC_OR_DEPARTMENT_OTHER): Payer: Medicare Other

## 2015-08-28 ENCOUNTER — Encounter (HOSPITAL_BASED_OUTPATIENT_CLINIC_OR_DEPARTMENT_OTHER): Payer: Self-pay | Admitting: *Deleted

## 2015-08-28 ENCOUNTER — Emergency Department (HOSPITAL_BASED_OUTPATIENT_CLINIC_OR_DEPARTMENT_OTHER)
Admission: EM | Admit: 2015-08-28 | Discharge: 2015-08-28 | Disposition: A | Discharge status: Home / Self Care | Payer: Medicare Other | Attending: Emergency Medicine | Admitting: Emergency Medicine

## 2015-08-28 DIAGNOSIS — R1032 Left lower quadrant pain: Secondary | ICD-10-CM | POA: Insufficient documentation

## 2015-08-28 DIAGNOSIS — F17210 Nicotine dependence, cigarettes, uncomplicated: Secondary | ICD-10-CM | POA: Diagnosis not present

## 2015-08-28 DIAGNOSIS — R300 Dysuria: Secondary | ICD-10-CM | POA: Diagnosis not present

## 2015-08-28 DIAGNOSIS — R110 Nausea: Secondary | ICD-10-CM | POA: Diagnosis not present

## 2015-08-28 DIAGNOSIS — M549 Dorsalgia, unspecified: Secondary | ICD-10-CM | POA: Diagnosis present

## 2015-08-28 DIAGNOSIS — I10 Essential (primary) hypertension: Secondary | ICD-10-CM | POA: Insufficient documentation

## 2015-08-28 DIAGNOSIS — R109 Unspecified abdominal pain: Secondary | ICD-10-CM | POA: Diagnosis not present

## 2015-08-28 DIAGNOSIS — Z79899 Other long term (current) drug therapy: Secondary | ICD-10-CM | POA: Diagnosis not present

## 2015-08-28 DIAGNOSIS — K5730 Diverticulosis of large intestine without perforation or abscess without bleeding: Secondary | ICD-10-CM | POA: Diagnosis not present

## 2015-08-28 LAB — CBC WITH DIFFERENTIAL/PLATELET
Basophils Absolute: 0.1 10*3/uL (ref 0.0–0.1)
Basophils Relative: 1 %
EOS ABS: 0.2 10*3/uL (ref 0.0–0.7)
EOS PCT: 2 %
HCT: 39.4 % (ref 36.0–46.0)
Hemoglobin: 13.7 g/dL (ref 12.0–15.0)
LYMPHS ABS: 3.4 10*3/uL (ref 0.7–4.0)
Lymphocytes Relative: 34 %
MCH: 31.9 pg (ref 26.0–34.0)
MCHC: 34.8 g/dL (ref 30.0–36.0)
MCV: 91.6 fL (ref 78.0–100.0)
MONO ABS: 0.8 10*3/uL (ref 0.1–1.0)
Monocytes Relative: 8 %
Neutro Abs: 5.4 10*3/uL (ref 1.7–7.7)
Neutrophils Relative %: 55 %
PLATELETS: 269 10*3/uL (ref 150–400)
RBC: 4.3 MIL/uL (ref 3.87–5.11)
RDW: 13.5 % (ref 11.5–15.5)
WBC: 9.9 10*3/uL (ref 4.0–10.5)

## 2015-08-28 LAB — URINALYSIS, ROUTINE W REFLEX MICROSCOPIC
BILIRUBIN URINE: NEGATIVE
Glucose, UA: NEGATIVE mg/dL
HGB URINE DIPSTICK: NEGATIVE
KETONES UR: NEGATIVE mg/dL
Leukocytes, UA: NEGATIVE
NITRITE: NEGATIVE
PH: 5.5 (ref 5.0–8.0)
Protein, ur: NEGATIVE mg/dL
Specific Gravity, Urine: 1.02 (ref 1.005–1.030)

## 2015-08-28 LAB — BASIC METABOLIC PANEL
Anion gap: 4 — ABNORMAL LOW (ref 5–15)
BUN: 20 mg/dL (ref 6–20)
CHLORIDE: 110 mmol/L (ref 101–111)
CO2: 25 mmol/L (ref 22–32)
Calcium: 8.7 mg/dL — ABNORMAL LOW (ref 8.9–10.3)
Creatinine, Ser: 0.85 mg/dL (ref 0.44–1.00)
GFR calc Af Amer: >60 mL/min (ref >60)
GFR calc non Af Amer: >60 mL/min (ref >60)
GLUCOSE: 97 mg/dL (ref 65–99)
POTASSIUM: 4.2 mmol/L (ref 3.5–5.1)
SODIUM: 139 mmol/L (ref 135–145)

## 2015-08-28 LAB — PREGNANCY, URINE: PREG TEST UR: NEGATIVE

## 2015-08-28 MED ORDER — HYDROMORPHONE HCL 1 MG/ML IJ SOLN
1.0000 mg | Freq: Once | INTRAMUSCULAR | Status: AC
  Administered 2015-08-28: 1 mg via INTRAVENOUS
  Filled 2015-08-28: qty 1

## 2015-08-28 MED ORDER — ONDANSETRON 4 MG PO TBDP
4.0000 mg | ORAL_TABLET | Freq: Once | ORAL | Status: AC
  Administered 2015-08-28: 4 mg via ORAL
  Filled 2015-08-28: qty 1

## 2015-08-28 MED ORDER — ONDANSETRON HCL 4 MG/2ML IJ SOLN
4.0000 mg | Freq: Once | INTRAMUSCULAR | Status: AC
  Administered 2015-08-28: 4 mg via INTRAVENOUS
  Filled 2015-08-28: qty 2

## 2015-08-28 NOTE — ED Provider Notes (Signed)
[facility] Provider Note   CSN: [id number] Arrival date & time: 08/28/15  1026     History   Chief Complaint Chief Complaint  Patient presents with  . Back Pain    HPI Dana Morrow is a 50 y.o. female.  HPI Dana Morrow is a 50 y.o. female with history of chronic left arm pain and trigeminal neuralgia, also chronic left lower quadrant abdominal pain, anxiety, depression, chronic back pain, presents to emergency department complaining of acute onset of worsening back pain while at work today. Patient states she has had mild pain in the last 3 days in the left lower back. She states this pain feels different from her chronic pain. She has been taking her regular pain medications which includes Dilaudid 2 mg 3 times a day. States she has had mild dysuria over the last 3 days as well. Today while sitting at the desk, patient became nauseated and states she developed severe sharp pain in left flank shooting down the left lower abdomen. She states it felt similar to prior kidney stone which she had multiple years ago. Patient denies fever or chills. Reports nausea but no vomiting. No diarrhea. Last bowel movement was yesterday and normal. Denies hematuria. No vaginal discharge or bleeding. States nothing she has tried made her better, nothing makes it worse. Denies any other complaints.  Past Medical History:  Diagnosis Date  . Abdominal pain, chronic, left lower quadrant    a. s/p Laproscopy in 2004 w/o finding of source of discomfort.  . Anxiety and depression 2013   Lots of stress  . Chest pain    a. reportedly nl MV and echo 10/2011 - [place]  . Cholelithiasis    a. s/p cholecystectomy 10/2011  . Colon polyps    a. s/p colonoscopy and polypectomy in 2003  . Diverticulosis    with ? of 'itis  . Family history of colon cancer    mother  . GERD (gastroesophageal reflux disease)   . Hiatal hernia    a. small by EGD 2006.  [name]. History of amaurosis fugax 01/2011   L  eye; saw neuro at [facility] (Dr. Johnell ComingsMieden).  Lab w/u and carotid doppler nl per old records.  . Hyperlipidemia    LDL 191 in 2013 per old records  . Hypertension   . Migraine   . RSD (reflex sympathetic dystrophy)    a. L shoulder--pain mgmt with Dr. Marlynn PerkingMalloy at [facility] [place]  . Tobacco abuse    a. 50 pack year hx.  . Visual disturbance    a. 12/2011 Left eye visual changes->w/u for TIA->Carotid U/S:  No ICA stenosis; Head CT: No acute abnl; MRI/MRA: mild RPCA stenosis; Echo: EF 55-60%, Triv AI, No PFO.    Patient Active Problem List   Diagnosis Date Noted  . Trigeminal neuralgia of left side of face 05/10/2015  . Chest pressure 05/10/2015  . Acute bronchitis 03/27/2015    Past Surgical History:  Procedure Laterality Date  . CESAREAN SECTION  1990  . COLONOSCOPY  10/2001   Dr. Ewing SchleinMagod; diverticulosis, int/ext hem, one small polyp removed.  . ENDOMETRIAL ABLATION  2011  . ESOPHAGOGASTRODUODENOSCOPY  02/2004   Dr. Ewing SchleinMagod; normal  . LAPAROSCOPIC CHOLECYSTECTOMY  10/19/11  . LAPAROSCOPY  05/2002   Dr. Harmon Dunimothy Davis--no cause for LLQ abd pain found  . SHOULDER SURGERY Left 1997 &98  . UMBILICAL HERNIA REPAIR  2014   with mesh    OB History  No data available       Home Medications    Prior to Admission medications   Medication Sig Start Date End Date Taking? Authorizing Provider  albuterol (PROVENTIL HFA;VENTOLIN HFA) 108 (90 Base) MCG/ACT inhaler Inhale 2 puffs into the lungs every 6 (six) hours as needed for wheezing or shortness of breath. 03/19/15   Shon Batonourtney F Horton, MD  cloNIDine (CATAPRES) 0.2 MG tablet 1 tab po tid 04/18/15   Jeoffrey MassedPhilip H McGowen, MD  HYDROmorphone (DILAUDID) 2 MG tablet Take by mouth 3 (three) times daily.    Historical Provider, MD  ondansetron (ZOFRAN ODT) 4 MG disintegrating tablet Take 1 tablet (4 mg total) by mouth every 8 (eight) hours as needed for nausea. 03/05/15   Tilden FossaElizabeth Rees, MD  predniSONE  (DELTASONE) 20 MG tablet 2 tabs po qd x 5d, then 1 tab po qd x 5d 05/17/15   Jeoffrey MassedPhilip H McGowen, MD    Family History Family History  Problem Relation Age of Onset  . Dementia Father   . Parkinson's disease Father     d. 2013  . Hypertension Father   . Hyperlipidemia Father   . Heart disease Father   . Heart attack Mother     died @ 9767  . Colon cancer Mother   . Hyperlipidemia Mother   . Heart disease Mother   . Hypertension Mother   . Esophageal cancer Brother   . Diabetes Neg Hx     Social History Social History  Substance Use Topics  . Smoking status: Current Every Day Smoker    Packs/day: 0.50    Years: 30.00    Types: Cigarettes  . Smokeless tobacco: Never Used  . Alcohol use No     Allergies   Flagyl [metronidazole]; Elavil [amitriptyline]; Klonopin [clonazepam]; and Levaquin [levofloxacin hemihydrate]   Review of Systems Review of Systems  Constitutional: Negative for chills and fever.  Respiratory: Negative for cough, chest tightness and shortness of breath.   Cardiovascular: Negative for chest pain, palpitations and leg swelling.  Gastrointestinal: Positive for abdominal pain and nausea. Negative for diarrhea and vomiting.  Genitourinary: Positive for dysuria and flank pain. Negative for pelvic pain, vaginal bleeding, vaginal discharge and vaginal pain.  Musculoskeletal: Negative for arthralgias, myalgias, neck pain and neck stiffness.  Skin: Negative for rash.  Neurological: Negative for dizziness, weakness and headaches.  All other systems reviewed and are negative.    Physical Exam Updated Vital Signs BP 139/84 (BP Location: Right Arm)   Pulse 92   Temp 98.2 F (36.8 C) (Oral)   Resp 18   Ht 5\' 3"  (1.6 m)   Wt 78 kg   SpO2 100%   BMI 30.47 kg/m   Physical Exam  Constitutional: She appears well-developed and well-nourished. No distress.  HENT:  Head: Normocephalic.  Eyes: Conjunctivae are normal.  Neck: Neck supple.  Cardiovascular:  Normal rate, regular rhythm and normal heart sounds.   Pulmonary/Chest: Effort normal and breath sounds normal. No respiratory distress. She has no wheezes. She has no rales.  Abdominal: Soft. Bowel sounds are normal. She exhibits no distension. There is tenderness. There is no rebound and no guarding.  Left lower quadrant tenderness, no guarding or rebound tenderness. No CVA tenderness bilaterally  Musculoskeletal: She exhibits no edema.  Tenderness to palpation over the left mid back spinal muscles.  Neurological: She is alert.  Skin: Skin is warm and dry. Capillary refill takes less than 2 seconds.  Psychiatric: She has a normal mood and affect.  Her behavior is normal.  Nursing note and vitals reviewed.    ED Treatments / Results  Labs (all labs ordered are listed, but only abnormal results are displayed) Labs Reviewed  BASIC METABOLIC PANEL - Abnormal; Notable for the following:       Result Value   Calcium 8.7 (*)    Anion gap 4 (*)    All other components within normal limits  URINALYSIS, ROUTINE W REFLEX MICROSCOPIC (NOT AT [facility])  PREGNANCY, URINE  CBC WITH DIFFERENTIAL/PLATELET    EKG  EKG Interpretation None       Radiology Ct Renal Stone Study  Result Date: 08/28/2015 CLINICAL DATA:  Left flank pain with dysuria and nausea. History of kidney stones. EXAM: CT ABDOMEN AND PELVIS WITHOUT CONTRAST TECHNIQUE: Multidetector CT imaging of the abdomen and pelvis was performed following the standard protocol without IV contrast. COMPARISON:  03/05/2015 FINDINGS: Lower chest:  No acute findings. Hepatobiliary: Cholecystectomy.  Otherwise normal. Pancreas: No mass or inflammatory process identified on this un-enhanced exam. Spleen: Normal. Adrenals/Urinary Tract: No evidence of urolithiasis or hydronephrosis. No definite mass visualized on this un-enhanced exam. The bladder is normal. Stomach/Bowel: Scattered diverticula throughout the colon. No inflammation. The bowel is  otherwise normal including the terminal ileum and appendix. Vascular/Lymphatic: Aortic atherosclerosis.  No adenopathy. Reproductive: No mass or other significant abnormality. Other: No free air or free fluid. Musculoskeletal:  No suspicious bone lesions identified. IMPRESSION: Diverticulosis of the colon.  Otherwise benign appearing abdomen. Electronically Signed   By: Francene Boyers M.D.   On: 08/28/2015 11:48    Procedures Procedures (including critical care time)  Medications Ordered in ED Medications  HYDROmorphone (DILAUDID) injection 1 mg (1 mg Intravenous Given 08/28/15 1126)  ondansetron (ZOFRAN) injection 4 mg (4 mg Intravenous Given 08/28/15 1126)  HYDROmorphone (DILAUDID) injection 1 mg (1 mg Intravenous Given 08/28/15 1224)  ondansetron (ZOFRAN-ODT) disintegrating tablet 4 mg (4 mg Oral Given 08/28/15 1238)     Initial Impression / Assessment and Plan / ED Course  I have reviewed the triage vital signs and the nursing notes.  Pertinent labs & imaging results that were available during my care of the patient were reviewed by me and considered in my medical decision making (see chart for details).  Clinical Course   11:14 AM Patient seen and examined. Patient with chronic pain, followed by pain management, here for a "different pain in left lower back." She has associated urinary symptoms and pain in the left lower abdomen. History and exam suspicious for possible renal stone versus pyelonephritis. Will get urinalysis, basic labs, CT renal stone study. Dilaudid and Zofran ordered for her symptoms.  12:37 PM UA negative, no signs of infection. A CT renal stone is negative for any acute abnormality. Shows diverticulosis. Labs unremarkable. Patient states pain has improved with medications. Most likely exacerbation of her chronic back pain. She is neurovascular intact, no evidence of cauda equina, stable for discharge home at this time. She'll follow-up with her doctor.  Final  Clinical Impressions(s) / ED Diagnoses   Final diagnoses:  Flank pain    New Prescriptions New Prescriptions   No medications on file     Jaynie Crumble, PA-C 08/28/15 1239    Doug Sou, MD 08/28/15 [phone]

## 2015-08-28 NOTE — ED Triage Notes (Signed)
Pt c/o lower back pain x 4 days

## 2015-08-28 NOTE — ED Notes (Signed)
PA at bedside.

## 2015-08-28 NOTE — ED Notes (Signed)
Patient reports left sided back pain which began Thursday.  Reports dysuria, nausea.

## 2015-08-28 NOTE — Discharge Instructions (Signed)
Your CT scan, lab work, urinalysis did not show any abnormality that would explain your pain. Her CT scan did show diverticulosis of your intestine, this normally does not cause any pain. Please follow-up with your doctor for further evaluation and recheck. Continue your current medications. Return if any worsening symptoms.

## 2015-08-31 DIAGNOSIS — G90519 Complex regional pain syndrome I of unspecified upper limb: Secondary | ICD-10-CM | POA: Diagnosis not present

## 2015-08-31 DIAGNOSIS — Z79891 Long term (current) use of opiate analgesic: Secondary | ICD-10-CM | POA: Diagnosis not present

## 2015-08-31 DIAGNOSIS — G90512 Complex regional pain syndrome I of left upper limb: Secondary | ICD-10-CM | POA: Diagnosis not present

## 2015-08-31 DIAGNOSIS — G8929 Other chronic pain: Secondary | ICD-10-CM | POA: Diagnosis not present

## 2015-09-04 DIAGNOSIS — G90519 Complex regional pain syndrome I of unspecified upper limb: Secondary | ICD-10-CM | POA: Diagnosis not present

## 2015-09-04 DIAGNOSIS — G8929 Other chronic pain: Secondary | ICD-10-CM | POA: Diagnosis not present

## 2015-09-28 DIAGNOSIS — G90519 Complex regional pain syndrome I of unspecified upper limb: Secondary | ICD-10-CM | POA: Diagnosis not present

## 2015-10-29 ENCOUNTER — Encounter (HOSPITAL_BASED_OUTPATIENT_CLINIC_OR_DEPARTMENT_OTHER): Payer: Self-pay | Admitting: *Deleted

## 2015-10-29 ENCOUNTER — Emergency Department (HOSPITAL_BASED_OUTPATIENT_CLINIC_OR_DEPARTMENT_OTHER)
Admission: EM | Admit: 2015-10-29 | Discharge: 2015-10-29 | Disposition: A | Discharge status: Home / Self Care | Payer: Medicare Other | Attending: Emergency Medicine | Admitting: Emergency Medicine

## 2015-10-29 DIAGNOSIS — R109 Unspecified abdominal pain: Secondary | ICD-10-CM | POA: Diagnosis not present

## 2015-10-29 DIAGNOSIS — R1032 Left lower quadrant pain: Secondary | ICD-10-CM | POA: Diagnosis not present

## 2015-10-29 DIAGNOSIS — Z79899 Other long term (current) drug therapy: Secondary | ICD-10-CM | POA: Insufficient documentation

## 2015-10-29 DIAGNOSIS — I10 Essential (primary) hypertension: Secondary | ICD-10-CM | POA: Insufficient documentation

## 2015-10-29 DIAGNOSIS — F17210 Nicotine dependence, cigarettes, uncomplicated: Secondary | ICD-10-CM | POA: Diagnosis not present

## 2015-10-29 LAB — CBC WITH DIFFERENTIAL/PLATELET
BASOS ABS: 0.1 10*3/uL (ref 0.0–0.1)
BASOS PCT: 1 %
EOS ABS: 0.2 10*3/uL (ref 0.0–0.7)
Eosinophils Relative: 2 %
HEMATOCRIT: 41.8 % (ref 36.0–46.0)
Hemoglobin: 14.4 g/dL (ref 12.0–15.0)
Lymphocytes Relative: 36 %
Lymphs Abs: 3.5 10*3/uL (ref 0.7–4.0)
MCH: 31.6 pg (ref 26.0–34.0)
MCHC: 34.4 g/dL (ref 30.0–36.0)
MCV: 91.9 fL (ref 78.0–100.0)
MONO ABS: 0.7 10*3/uL (ref 0.1–1.0)
MONOS PCT: 7 %
NEUTROS ABS: 5.1 10*3/uL (ref 1.7–7.7)
Neutrophils Relative %: 54 %
PLATELETS: 259 10*3/uL (ref 150–400)
RBC: 4.55 MIL/uL (ref 3.87–5.11)
RDW: 13.8 % (ref 11.5–15.5)
WBC: 9.6 10*3/uL (ref 4.0–10.5)

## 2015-10-29 LAB — COMPREHENSIVE METABOLIC PANEL
ALT: 11 U/L — ABNORMAL LOW (ref 14–54)
ANION GAP: 7 (ref 5–15)
AST: 14 U/L — AB (ref 15–41)
Albumin: 3.9 g/dL (ref 3.5–5.0)
Alkaline Phosphatase: 73 U/L (ref 38–126)
BUN: 14 mg/dL (ref 6–20)
CHLORIDE: 109 mmol/L (ref 101–111)
CO2: 22 mmol/L (ref 22–32)
Calcium: 9 mg/dL (ref 8.9–10.3)
Creatinine, Ser: 0.87 mg/dL (ref 0.44–1.00)
GFR calc Af Amer: >60 mL/min (ref >60)
GFR calc non Af Amer: >60 mL/min (ref >60)
GLUCOSE: 107 mg/dL — AB (ref 65–99)
POTASSIUM: 4 mmol/L (ref 3.5–5.1)
SODIUM: 138 mmol/L (ref 135–145)
TOTAL PROTEIN: 7 g/dL (ref 6.5–8.1)
Total Bilirubin: 0.4 mg/dL (ref 0.3–1.2)

## 2015-10-29 LAB — URINALYSIS, ROUTINE W REFLEX MICROSCOPIC
BILIRUBIN URINE: NEGATIVE
GLUCOSE, UA: NEGATIVE mg/dL
Hgb urine dipstick: NEGATIVE
KETONES UR: NEGATIVE mg/dL
LEUKOCYTES UA: NEGATIVE
NITRITE: NEGATIVE
PH: 5.5 (ref 5.0–8.0)
PROTEIN: NEGATIVE mg/dL
Specific Gravity, Urine: 1.014 (ref 1.005–1.030)

## 2015-10-29 MED ORDER — ONDANSETRON HCL 4 MG/2ML IJ SOLN
4.0000 mg | Freq: Once | INTRAMUSCULAR | Status: DC
Start: 2015-10-29 — End: 2015-10-29

## 2015-10-29 MED ORDER — HYDROMORPHONE HCL 1 MG/ML IJ SOLN
1.0000 mg | Freq: Once | INTRAMUSCULAR | Status: AC
  Administered 2015-10-29: 1 mg via INTRAVENOUS
  Filled 2015-10-29: qty 1

## 2015-10-29 MED ORDER — ONDANSETRON HCL 4 MG/2ML IJ SOLN
4.0000 mg | Freq: Once | INTRAMUSCULAR | Status: AC
  Administered 2015-10-29: 4 mg via INTRAVENOUS
  Filled 2015-10-29: qty 2

## 2015-10-29 NOTE — ED Provider Notes (Signed)
[facility] Provider Note   CSN: [id number] Arrival date & time: 10/29/15  1250     History   Chief Complaint Chief Complaint  Patient presents with  . Flank Pain    HPI Dana Morrow is a 50 y.o. female.  HPI Dana Morrow is a 50 y.o. female with PMH significant for chronic LLQ pain, diverticulosis, HLD, trigeminal neuralgia, HTN, chronic left arm pain, and chronic back pain who presents with sudden onset, severe, left sided flank pain that radiates to her groin that began this morning.  She takes 2 mg Dilaudid TID.  She states this feels similar to prior kidney stone.  Seems to be worse with standing.  No fever, chills, CP, SOB, cough, dysuria, or hematuria.  She endorses urinary frequency.  No N/V.  Endorses somewhat looser over the last couple of days, but no bloody stools.  She denies any new abdominal pain.  She has taken her daily Dilaudid without relief.   Past Medical History:  Diagnosis Date  . Abdominal pain, chronic, left lower quadrant    a. s/p Laproscopy in 2004 w/o finding of source of discomfort.  . Anxiety and depression 2013   Lots of stress  . Chest pain    a. reportedly nl MV and echo 10/2011 - [place]  . Cholelithiasis    a. s/p cholecystectomy 10/2011  . Colon polyps    a. s/p colonoscopy and polypectomy in 2003  . Diverticulosis    with ? of 'itis  . Family history of colon cancer    mother  . GERD (gastroesophageal reflux disease)   . Hiatal hernia    a. small by EGD 2006.  [name] History of amaurosis fugax 01/2011   L eye; saw neuro at [facility] (Dr. Johnell Comings).  Lab w/u and carotid doppler nl per old records.  . Hyperlipidemia    LDL 191 in 2013 per old records  . Hypertension   . Migraine   . RSD (reflex sympathetic dystrophy)    a. L shoulder--pain mgmt with Dr. Marlynn Perking at [facility] in [place]  . Tobacco abuse    a. 50 pack year hx.  . Visual disturbance    a. 12/2011 Left eye  visual changes->w/u for TIA->Carotid U/S:  No ICA stenosis; Head CT: No acute abnl; MRI/MRA: mild RPCA stenosis; Echo: EF 55-60%, Triv AI, No PFO.    Patient Active Problem List   Diagnosis Date Noted  . Trigeminal neuralgia of left side of face 05/10/2015  . Chest pressure 05/10/2015  . Acute bronchitis 03/27/2015    Past Surgical History:  Procedure Laterality Date  . CESAREAN SECTION  1990  . COLONOSCOPY  10/2001   Dr. Ewing Schlein; diverticulosis, int/ext hem, one small polyp removed.  . ENDOMETRIAL ABLATION  2011  . ESOPHAGOGASTRODUODENOSCOPY  02/2004   Dr. Ewing Schlein; normal  . LAPAROSCOPIC CHOLECYSTECTOMY  10/19/11  . LAPAROSCOPY  05/2002   Dr. Harmon Dun cause for LLQ abd pain found  . SHOULDER SURGERY Left 1997 &98  . UMBILICAL HERNIA REPAIR  2014   with mesh    OB History    No data available       Home Medications    Prior to Admission medications   Medication Sig Start Date End Date Taking? Authorizing Provider  cloNIDine (CATAPRES) 0.2 MG tablet 1 tab po tid 04/18/15   Jeoffrey Massed, MD  HYDROmorphone (DILAUDID) 2 MG tablet Take by mouth 3 (three) times daily.  Historical Provider, MD    Family History Family History  Problem Relation Age of Onset  . Dementia Father   . Parkinson's disease Father     d. 2013  . Hypertension Father   . Hyperlipidemia Father   . Heart disease Father   . Heart attack Mother     died @ 74  . Colon cancer Mother   . Hyperlipidemia Mother   . Heart disease Mother   . Hypertension Mother   . Esophageal cancer Brother   . Diabetes Neg Hx     Social History Social History  Substance Use Topics  . Smoking status: Current Every Day Smoker    Packs/day: 0.50    Years: 30.00    Types: Cigarettes  . Smokeless tobacco: Never Used  . Alcohol use No     Allergies   Flagyl [metronidazole]; Elavil [amitriptyline]; Klonopin [clonazepam]; and Levaquin [levofloxacin hemihydrate]   Review of Systems Review of  Systems All other systems negative unless otherwise stated in HPI   Physical Exam Updated Vital Signs BP 150/95 (BP Location: Right Arm)   Pulse 79   Temp 97.8 F (36.6 C) (Oral)   Resp 20   Ht 5\' 3"  (1.6 m)   Wt 77.1 kg   SpO2 97%   BMI 30.11 kg/m   Physical Exam  Constitutional: She is oriented to person, place, and time. She appears well-developed and well-nourished.  Non-toxic appearance. She does not have a sickly appearance. She does not appear ill.  HENT:  Head: Normocephalic and atraumatic.  Mouth/Throat: Oropharynx is clear and moist.  Eyes: Conjunctivae are normal.  Neck: Normal range of motion. Neck supple.  Cardiovascular: Normal rate and regular rhythm.   Pulmonary/Chest: Effort normal and breath sounds normal. No accessory muscle usage or stridor. No respiratory distress. She has no wheezes. She has no rhonchi. She has no rales.  Abdominal: Soft. Bowel sounds are normal. She exhibits no distension. There is tenderness (chronic, but states this is worse) in the left lower quadrant. There is no rebound and no guarding.  No CVA tenderness.  Musculoskeletal: Normal range of motion. She exhibits no tenderness.  No c/t/l midline tenderness.  No tenderness along left flank.   Lymphadenopathy:    She has no cervical adenopathy.  Neurological: She is alert and oriented to person, place, and time.  Speech clear without dysarthria.  Skin: Skin is warm and dry.  Psychiatric: She has a normal mood and affect. Her behavior is normal.     ED Treatments / Results  Labs (all labs ordered are listed, but only abnormal results are displayed) Labs Reviewed  COMPREHENSIVE METABOLIC PANEL - Abnormal; Notable for the following:       Result Value   Glucose, Bld 107 (*)    AST 14 (*)    ALT 11 (*)    All other components within normal limits  URINALYSIS, ROUTINE W REFLEX MICROSCOPIC (NOT AT [facility])  CBC WITH DIFFERENTIAL/PLATELET    EKG  EKG Interpretation None        Radiology No results found.  Procedures Procedures (including critical care time)  Medications Ordered in ED Medications  ondansetron (ZOFRAN) injection 4 mg (4 mg Intravenous Not Given 10/29/15 1544)  HYDROmorphone (DILAUDID) injection 1 mg (1 mg Intravenous Given 10/29/15 1411)  HYDROmorphone (DILAUDID) injection 1 mg (1 mg Intravenous Given 10/29/15 1520)  ondansetron (ZOFRAN) injection 4 mg (4 mg Intravenous Given 10/29/15 1520)     Initial Impression / Assessment and Plan /  ED Course  I have reviewed the triage vital signs and the nursing notes.  Pertinent labs & imaging results that were available during my care of the patient were reviewed by me and considered in my medical decision making (see chart for details).  Clinical Course   Patient presents with sudden onset, constant, radiating left flank pain.  Hx of chronic low back pain and LLQ pain.  These are unchanged.  On exam, VSS, NAD.  APpears well, non-toxic or septic.  No CVA tenderness.  No flank tenderness to palpation.  No midline tenderness. Abdomen soft and benign with mild tenderness in LLQ, this is chronic.  Hx of kidney stones.  States this feels similar.  Patient has had multiple CT scans in the past.  UA obtained in triage without abnormalities.  Will obtain labs. If abnormal, will obtain renal ultrasound.   Labs without acute abnormalities.  Patient feels improved.  Do not suspect diverticulitis, nephrolithiasis, discitis, or other acute infectious or surgical etiology.  Suspect related to chronic pain.  Patient has follow up with PCP tomorrow.  Return precautions discussed.  Stable for discharge.  Case has been discussed with Dr. Clarene DukeLittle who agrees with the above plan for discharge.   Final Clinical Impressions(s) / ED Diagnoses   Final diagnoses:  Left flank pain    New Prescriptions Discharge Medication List as of 10/29/2015  3:30 PM       Cheri FowlerKayla Jemel Ono, PA-C 10/29/15 1628    Laurence Spatesachel Morgan  Little, MD 10/30/15 [phone]

## 2015-10-29 NOTE — ED Notes (Signed)
Pt given d/c instructions as per chart. Verbalizes understanding. No questions. 

## 2015-10-29 NOTE — ED Triage Notes (Signed)
hX of kidney stones.  Reports left flank pain radiating around into her back and lower abdomen.  Also reports dx of diverticulosis.

## 2015-10-30 DIAGNOSIS — M47816 Spondylosis without myelopathy or radiculopathy, lumbar region: Secondary | ICD-10-CM | POA: Diagnosis not present

## 2015-10-30 DIAGNOSIS — G90519 Complex regional pain syndrome I of unspecified upper limb: Secondary | ICD-10-CM | POA: Diagnosis not present

## 2015-10-30 DIAGNOSIS — G8929 Other chronic pain: Secondary | ICD-10-CM | POA: Diagnosis not present

## 2015-10-30 DIAGNOSIS — Z79891 Long term (current) use of opiate analgesic: Secondary | ICD-10-CM | POA: Diagnosis not present

## 2015-11-04 ENCOUNTER — Encounter (HOSPITAL_BASED_OUTPATIENT_CLINIC_OR_DEPARTMENT_OTHER): Payer: Self-pay | Admitting: Emergency Medicine

## 2015-11-04 ENCOUNTER — Emergency Department (HOSPITAL_BASED_OUTPATIENT_CLINIC_OR_DEPARTMENT_OTHER)
Admission: EM | Admit: 2015-11-04 | Discharge: 2015-11-04 | Disposition: A | Discharge status: Home / Self Care | Payer: Medicare Other | Attending: Emergency Medicine | Admitting: Emergency Medicine

## 2015-11-04 DIAGNOSIS — M545 Low back pain: Secondary | ICD-10-CM | POA: Insufficient documentation

## 2015-11-04 DIAGNOSIS — G8929 Other chronic pain: Secondary | ICD-10-CM | POA: Insufficient documentation

## 2015-11-04 DIAGNOSIS — Z79899 Other long term (current) drug therapy: Secondary | ICD-10-CM | POA: Insufficient documentation

## 2015-11-04 DIAGNOSIS — F17210 Nicotine dependence, cigarettes, uncomplicated: Secondary | ICD-10-CM | POA: Diagnosis not present

## 2015-11-04 DIAGNOSIS — R1032 Left lower quadrant pain: Secondary | ICD-10-CM | POA: Diagnosis not present

## 2015-11-04 MED ORDER — IBUPROFEN 600 MG PO TABS: 600.0000 mg | ORAL_TABLET | Freq: Three times a day (TID) | ORAL | 0 refills | Status: DC

## 2015-11-04 MED ORDER — KETOROLAC TROMETHAMINE 30 MG/ML IJ SOLN
30.0000 mg | Freq: Once | INTRAMUSCULAR | Status: AC
  Administered 2015-11-04: 30 mg via INTRAMUSCULAR
  Filled 2015-11-04: qty 1

## 2015-11-04 NOTE — ED Triage Notes (Signed)
Pt seen here last Sunday for same.  States left lower back pain wrapping around to lower abdomen.  Pt reports taking 3 pain pills this morning with no relief.

## 2015-11-04 NOTE — Discharge Instructions (Signed)
Back Pain: Your back pain should be treated with medicines such as ibuprofen or aleve and this back pain should get better over the next 2 weeks.  However if you develop severe or worsening pain, low back pain with fever, numbness, weakness or inability to walk or urinate, you should return to the ER immediately.  Please follow up with your doctor this week for a recheck if still having symptoms.  Avoid heavy lifting over 10 pounds over the next two weeks.  Low back pain is discomfort in the lower back that may be due to injuries to muscles and ligaments around the spine.  Occasionally, it may be caused by a a problem to a part of the spine called a disc.  The pain may last several days or a week;  However, most patients get completely well in 4 weeks.  Self - care:  The application of heat can help soothe the pain.  Maintaining your daily activities, including walking, is encourged, as it will help you get better faster than just staying in bed. Perform gentle stretching as discussed. Drink plenty of fluids.  Medications are also useful to help with pain control.  A commonly prescribed medication includes your home dilaudid.  Do not drive or operate heavy machinery while taking this medication.  Non steroidal anti inflammatory medications including Ibuprofen and naproxen;  These medications help both pain and swelling and are very useful in treating back pain.  They should be taken with food, as they can cause stomach upset, and more seriously, stomach bleeding.    SEEK IMMEDIATE MEDICAL ATTENTION IF: New numbness, tingling, weakness, or problem with the use of your arms or legs.  Severe back pain not relieved with medications.  Difficulty with or loss of control of your bowel or bladder control.  Increasing pain in any areas of the body (such as chest or abdominal pain).  Shortness of breath, dizziness or fainting.  Nausea (feeling sick to your stomach), vomiting, fever, or sweats.  You will  need to follow up with  Your primary healthcare provider in 3-5 days for reassessment.

## 2015-11-04 NOTE — ED Notes (Signed)
Patient is alert and oriented x3.  She was given DC instructions and follow up visit instructions.  Patient gave verbal understanding. She was DC ambulatory under her own power to home.  V/S stable.  He was not showing any signs of distress on DC 

## 2015-11-04 NOTE — ED Provider Notes (Signed)
[facility] Provider Note   CSN: [id number] Arrival date & time: 11/04/15  1542  By signing my name below, I, Majel HomerPeyton Lee, attest that this documentation has been prepared under the direction and in the presence of non-physician practitioner, [id number]de Record, PA-C. Electronically Signed: Majel HomerPeyton Lee, Scribe. 11/04/2015. 4:38 PM.  History   Chief Complaint Chief Complaint  Patient presents with  . Back Pain   The history is provided by the patient and medical records. No language interpreter was used.  Back Pain   This is a chronic problem. The current episode started more than 1 week ago. The problem occurs constantly. The problem has been gradually worsening. The pain is associated with no known injury. The pain is present in the lumbar spine. The quality of the pain is described as shooting. The pain radiates to the left thigh and left foot. The pain is at a severity of 7/10. The pain is moderate. Exacerbated by: ambulation. The pain is the same all the time. Associated symptoms include abdominal pain (chronic LLQ) and leg pain. Pertinent negatives include no chest pain, no fever, no numbness, no bowel incontinence, no perianal numbness, no bladder incontinence, no dysuria, no paresthesias, no tingling and no weakness. She has tried NSAIDs (and dilaudid) for the symptoms. The treatment provided mild relief.   HPI Comments: Patsi Searseresa L Mcneff is a 50 y.o. female with PMHx of chronic LLQ abdominal pain, HTN, diverticulosis, chronic back pain, and a multitude of medical problems listed below, with a PSHx of cholecystectomy, who presents to the Emergency Department for an evaluation of ongoing somewhat chronic left lower back pain that worsened 1.5 weeks ago. Pt describes her back pain as 7/10, "stabbing and shooting" constant pain that radiates intermittently into the LLQ of her abdomen and left leg. She notes her pain is exacerbated when ambulating. She notes she has taken her chronic home  Dilaudid 2mg  QID and occasional ibuprofen today with very mild relief. Pt reports she visited the ED 1 week ago for similar pain; however, she had a U/A and lab work without any abnormalities. She states that she thought this felt like her kidney stone pain, but isn't sure. Denies fevers, chills, CP, SOB, N/V/D/C, hematochezia, melena, hematuria, dysuria, myalgias, arthralgias, numbness, tingling, vaginal bleeding or discharge, focal weakness, recent travel, sick contacts, EtOH use, suspicious food intake, or rashes. Sees a chronic pain specialist.   Past Medical History:  Diagnosis Date  . Abdominal pain, chronic, left lower quadrant    a. s/p Laproscopy in 2004 w/o finding of source of discomfort.  . Anxiety and depression 2013   Lots of stress  . Chest pain    a. reportedly nl MV and echo 10/2011 - [place]  . Cholelithiasis    a. s/p cholecystectomy 10/2011  . Colon polyps    a. s/p colonoscopy and polypectomy in 2003  . Diverticulosis    with ? of 'itis  . Family history of colon cancer    mother  . GERD (gastroesophageal reflux disease)   . Hiatal hernia    a. small by EGD 2006.  [name]. History of amaurosis fugax 01/2011   L eye; saw neuro at [facility] (Dr. Johnell ComingsMieden).  Lab w/u and carotid doppler nl per old records.  . Hyperlipidemia    LDL 191 in 2013 per old records  . Hypertension   . Migraine   . RSD (reflex sympathetic dystrophy)    a. L shoulder--pain mgmt with Dr. Marlynn PerkingMalloy at [facility] [place]  . Tobacco abuse    a. 50 pack year hx.  . Visual disturbance    a. 12/2011 Left eye visual changes->w/u for TIA->Carotid U/S:  No ICA stenosis; Head CT: No acute abnl; MRI/MRA: mild RPCA stenosis; Echo: EF 55-60%, Triv AI, No PFO.    Patient Active Problem List   Diagnosis Date Noted  . Trigeminal neuralgia of left side of face 05/10/2015  . Chest pressure 05/10/2015  . Acute bronchitis 03/27/2015    Past Surgical History:    Procedure Laterality Date  . CESAREAN SECTION  1990  . COLONOSCOPY  10/2001   Dr. Ewing Schlein; diverticulosis, int/ext hem, one small polyp removed.  . ENDOMETRIAL ABLATION  2011  . ESOPHAGOGASTRODUODENOSCOPY  02/2004   Dr. Ewing Schlein; normal  . LAPAROSCOPIC CHOLECYSTECTOMY  10/19/11  . LAPAROSCOPY  05/2002   Dr. Harmon Dun cause for LLQ abd pain found  . SHOULDER SURGERY Left 1997 &98  . UMBILICAL HERNIA REPAIR  2014   with mesh    OB History    No data available     Home Medications    Prior to Admission medications   Medication Sig Start Date End Date Taking? Authorizing Provider  cloNIDine (CATAPRES) 0.2 MG tablet 1 tab po tid 04/18/15  Yes Jeoffrey Massed, MD  HYDROmorphone (DILAUDID) 2 MG tablet Take by mouth 4 (four) times daily as needed.    Yes Historical Provider, MD    Family History Family History  Problem Relation Age of Onset  . Dementia Father   . Parkinson's disease Father     d. 2013  . Hypertension Father   . Hyperlipidemia Father   . Heart disease Father   . Heart attack Mother     died @ 71  . Colon cancer Mother   . Hyperlipidemia Mother   . Heart disease Mother   . Hypertension Mother   . Esophageal cancer Brother   . Diabetes Neg Hx     Social History Social History  Substance Use Topics  . Smoking status: Current Every Day Smoker    Packs/day: 0.50    Years: 30.00    Types: Cigarettes  . Smokeless tobacco: Never Used  . Alcohol use No     Allergies   Flagyl [metronidazole]; Elavil [amitriptyline]; Klonopin [clonazepam]; and Levaquin [levofloxacin hemihydrate]   Review of Systems Review of Systems  Constitutional: Negative for chills and fever.  Respiratory: Negative for shortness of breath.   Cardiovascular: Negative for chest pain.  Gastrointestinal: Positive for abdominal pain (chronic LLQ). Negative for blood in stool, bowel incontinence, constipation, diarrhea, nausea and vomiting.  Genitourinary: Negative for bladder  incontinence, dysuria, hematuria, vaginal bleeding and vaginal discharge.  Musculoskeletal: Positive for back pain and myalgias (left leg).  Skin: Negative for rash.  Allergic/Immunologic: Negative for immunocompromised state.  Neurological: Negative for tingling, weakness, numbness and paresthesias.  Psychiatric/Behavioral: Negative for confusion.  10 systems reviewed and all are negative for acute change except as noted in the HPI.  Physical Exam Updated Vital Signs BP 176/90 (BP Location: Right Arm) Comment: MANUAL B/P  Pulse 107   Temp 98.4 F (36.9 C) (Oral)   Resp 20   Ht 5\' 3"  (1.6 m)   Wt 172 lb (78 kg)   SpO2 100%   BMI 30.47 kg/m   Physical Exam  Constitutional: She is oriented to person, place, and time. Vital signs are normal. She appears well-developed and well-nourished.  Non-toxic appearance. No distress.  Afebrile, nontoxic, NAD  HENT:  Head: Normocephalic and atraumatic.  Mouth/Throat: Oropharynx is clear and moist and mucous membranes are normal.  Eyes: Conjunctivae and EOM are normal. Right eye exhibits no discharge. Left eye exhibits no discharge.  Neck: Normal range of motion. Neck supple.  Cardiovascular: Normal rate, regular rhythm, normal heart sounds and intact distal pulses.  Exam reveals no gallop and no friction rub.   No murmur heard. Pulmonary/Chest: Effort normal and breath sounds normal. No respiratory distress. She has no decreased breath sounds. She has no wheezes. She has no rhonchi. She has no rales.  Abdominal: Soft. Normal appearance and bowel sounds are normal. She exhibits no distension. There is tenderness in the left lower quadrant. There is no rigidity, no rebound, no guarding, no CVA tenderness, no tenderness at McBurney's point and negative Murphy's sign.  Soft, non-distended, +BS throughout, with very mild LLQ TTP, no r/g/r, neg murphy's, neg mcburney's, no CVA TTP  Musculoskeletal: Normal range of motion.       Lumbar back: She  exhibits tenderness and spasm. She exhibits normal range of motion, no bony tenderness and no deformity.  Lumbar spine with FROM intact without spinous process TTP, no bony stepoffs or deformities, with mild left sided paraspinous muscle TTP and muscle spasms. Strength and sensation grossly intact in all extremities, negative SLR bilaterally, gait steady and nonantalgic. No overlying skin changes. Distal pulses intact.  Neurological: She is alert and oriented to person, place, and time. She has normal strength. No sensory deficit.  Skin: Skin is warm, dry and intact. No rash noted.  Psychiatric: She has a normal mood and affect.  Nursing note and vitals reviewed.  ED Treatments / Results  Labs (all labs ordered are listed, but only abnormal results are displayed) Labs Reviewed - No data to display Results for orders placed or performed during the hospital encounter of 10/29/15  Urinalysis, Routine w reflex microscopic (not at [facility])  Result Value Ref Range   Color, Urine YELLOW YELLOW   APPearance CLEAR CLEAR   Specific Gravity, Urine 1.014 1.005 - 1.030   pH 5.5 5.0 - 8.0   Glucose, UA NEGATIVE NEGATIVE mg/dL   Hgb urine dipstick NEGATIVE NEGATIVE   Bilirubin Urine NEGATIVE NEGATIVE   Ketones, ur NEGATIVE NEGATIVE mg/dL   Protein, ur NEGATIVE NEGATIVE mg/dL   Nitrite NEGATIVE NEGATIVE   Leukocytes, UA NEGATIVE NEGATIVE  CBC with Differential  Result Value Ref Range   WBC 9.6 4.0 - 10.5 K/uL   RBC 4.55 3.87 - 5.11 MIL/uL   Hemoglobin 14.4 12.0 - 15.0 g/dL   HCT 16.1 09.6 - 04.5 %   MCV 91.9 78.0 - 100.0 fL   MCH 31.6 26.0 - 34.0 pg   MCHC 34.4 30.0 - 36.0 g/dL   RDW 40.9 81.1 - 91.4 %   Platelets 259 150 - 400 K/uL   Neutrophils Relative % 54 %   Neutro Abs 5.1 1.7 - 7.7 K/uL   Lymphocytes Relative 36 %   Lymphs Abs 3.5 0.7 - 4.0 K/uL   Monocytes Relative 7 %   Monocytes Absolute 0.7 0.1 - 1.0 K/uL   Eosinophils Relative 2 %   Eosinophils Absolute 0.2 0.0 - 0.7 K/uL    Basophils Relative 1 %   Basophils Absolute 0.1 0.0 - 0.1 K/uL  Comprehensive metabolic panel  Result Value Ref Range   Sodium 138 135 - 145 mmol/L   Potassium 4.0 3.5 - 5.1 mmol/L   Chloride 109 101 - 111 mmol/L   CO2  22 22 - 32 mmol/L   Glucose, Bld 107 (H) 65 - 99 mg/dL   BUN 14 6 - 20 mg/dL   Creatinine, Ser 1.61 0.44 - 1.00 mg/dL   Calcium 9.0 8.9 - 09.6 mg/dL   Total Protein 7.0 6.5 - 8.1 g/dL   Albumin 3.9 3.5 - 5.0 g/dL   AST 14 (L) 15 - 41 U/L   ALT 11 (L) 14 - 54 U/L   Alkaline Phosphatase 73 38 - 126 U/L   Total Bilirubin 0.4 0.3 - 1.2 mg/dL   GFR calc non Af Amer >60 >60 mL/min   GFR calc Af Amer >60 >60 mL/min   Anion gap 7 5 - 15   EKG  EKG Interpretation None      Radiology No results found. CT Lumbar Spine Wo Contrast 06/27/2014 [facility] Result Impression  IMPRESSION: 1.Negative for acute fracture. Mild rightward curvature of the thoracic spine is nonspecific and may be positional or due to spasm. 2.Mild thoracolumbar and lower lumbar spondylosis with multilevel disc bulges and mild degenerative facet change.  Result Narrative  INDICATION:Backache NOS,  TECHNIQUE: CT LUMBAR SPINE WO CONTRAST; Helical axial images of the lumbar spine were performed. Multiplanar reformatted images were performed in sagittal and coronal planes.Contrast - none   COMPARISON:MRI of the lumbar spine dated October 06, 2012.  FINDINGS:  #Bones:Negative for acute fracture or dislocation. Preservation of vertebral body height. Small endplate osteophytes at T12-L1 and of the lower lumbar spine. #Paravertebral soft tissues:No mass, edema or collection evident.  #Alignment:Mild rightward curvature of the thoracic spine. #Disc Spaces: Disc spaces are preserved with overall normal density. #Additional Comments: The visualized retroperitoneum appears unremarkable.,  #T12-L1: Minimal disc bulge slightly effaces anterior thecal sac.  The neural foramina are patent. #L1-L2: Unremarkable. #L2-L3: Unremarkable. #L3-L4: Unremarkable. #L4-L5: Minimal disc bulge with mild degenerative facet change. The canal and neural foramina are patent. #L5-S1: Minimal disc bulge mildly effaces anterior thecal sac with mild degenerative facet hypertrophy. The canal and neural foramina are patent.    Procedures Procedures (including critical care time)  Medications Ordered in ED Medications  ketorolac (TORADOL) 30 MG/ML injection 30 mg (not administered)    DIAGNOSTIC STUDIES:  Oxygen Saturation is 100% on RA, normal by my interpretation.    COORDINATION OF CARE:  4:25 PM Discussed treatment plan with pt at bedside and pt agreed to plan.  Initial Impression / Assessment and Plan / ED Course  I have reviewed the triage vital signs and the nursing notes.  Pertinent labs & imaging results that were available during my care of the patient were reviewed by me and considered in my medical decision making (see chart for details).  Clinical Course    50 y.o. female here with chronic back pain and LLQ pain; already seen at [facility] on 10/29/15 and had CBC/CMP/Urinalysis that were unremarkable. No change in stool. Tenderness in LLQ is very mild, and pt states is c/w her chronic pain. Mild tenderness to L lumbar paraspinous muscles, mild spasm. Under pain contract on chronic dilaudid. Has had multiple CTs looking for kidney stones, and had CT lumbar spine in 06/2014 that showed multilevel DDD and spondylosis; hence, this pain is likely more musculoskeletal. Doubt need to repeat labs or U/A, especially since a kidney stone wouldn't usually last this long, and have lack of N/V or urinary symptoms; doubt diverticulitis or other emergent intraabdominal process. Will give toradol IM now, and start on NSAID regimen in addition to her dilaudid at home. Discussed  use of heat. F/up with pain specialist this week. I explained the diagnosis and have  given explicit precautions to return to the ER including for any other new or worsening symptoms. The patient understands and accepts the medical plan as it's been dictated and I have answered their questions. Discharge instructions concerning home care and prescriptions have been given. The patient is STABLE and is discharged to home in good condition.   I personally performed the services described in this documentation, which was scribed in my presence. The recorded information has been reviewed and is accurate.   Final Clinical Impressions(s) / ED Diagnoses   Final diagnoses:  Chronic left-sided low back pain without sciatica  Abdominal pain, chronic, left lower quadrant    New Prescriptions New Prescriptions   IBUPROFEN (ADVIL,MOTRIN) 600 MG TABLET    Take 1 tablet (600 mg total) by mouth 3 (three) times daily.     Hajra Port Camprubi-Soms, PA-C 11/04/15 1651    Shaune Pollackameron Isaacs, MD 11/05/15 1213

## 2015-11-20 DIAGNOSIS — M47816 Spondylosis without myelopathy or radiculopathy, lumbar region: Secondary | ICD-10-CM | POA: Diagnosis not present

## 2015-11-20 DIAGNOSIS — G8929 Other chronic pain: Secondary | ICD-10-CM | POA: Diagnosis not present

## 2015-11-24 DIAGNOSIS — K760 Fatty (change of) liver, not elsewhere classified: Secondary | ICD-10-CM | POA: Diagnosis not present

## 2015-11-24 DIAGNOSIS — G894 Chronic pain syndrome: Secondary | ICD-10-CM | POA: Diagnosis not present

## 2015-11-24 DIAGNOSIS — R1084 Generalized abdominal pain: Secondary | ICD-10-CM | POA: Diagnosis not present

## 2015-11-24 DIAGNOSIS — K529 Noninfective gastroenteritis and colitis, unspecified: Secondary | ICD-10-CM | POA: Diagnosis not present

## 2015-11-24 DIAGNOSIS — F17210 Nicotine dependence, cigarettes, uncomplicated: Secondary | ICD-10-CM | POA: Diagnosis not present

## 2015-11-24 DIAGNOSIS — I10 Essential (primary) hypertension: Secondary | ICD-10-CM | POA: Diagnosis present

## 2015-11-24 DIAGNOSIS — R072 Precordial pain: Secondary | ICD-10-CM | POA: Diagnosis not present

## 2015-11-24 DIAGNOSIS — R0789 Other chest pain: Secondary | ICD-10-CM | POA: Diagnosis not present

## 2015-11-24 DIAGNOSIS — F17200 Nicotine dependence, unspecified, uncomplicated: Secondary | ICD-10-CM | POA: Diagnosis not present

## 2015-11-24 DIAGNOSIS — R0602 Shortness of breath: Secondary | ICD-10-CM | POA: Diagnosis not present

## 2015-11-24 DIAGNOSIS — R109 Unspecified abdominal pain: Secondary | ICD-10-CM | POA: Diagnosis not present

## 2015-11-25 DIAGNOSIS — G894 Chronic pain syndrome: Secondary | ICD-10-CM | POA: Diagnosis not present

## 2015-11-25 DIAGNOSIS — I10 Essential (primary) hypertension: Secondary | ICD-10-CM | POA: Diagnosis not present

## 2015-11-25 DIAGNOSIS — R112 Nausea with vomiting, unspecified: Secondary | ICD-10-CM | POA: Diagnosis not present

## 2015-11-25 DIAGNOSIS — K529 Noninfective gastroenteritis and colitis, unspecified: Secondary | ICD-10-CM | POA: Diagnosis not present

## 2015-11-25 DIAGNOSIS — F17200 Nicotine dependence, unspecified, uncomplicated: Secondary | ICD-10-CM | POA: Diagnosis not present

## 2015-11-25 DIAGNOSIS — R079 Chest pain, unspecified: Secondary | ICD-10-CM | POA: Diagnosis not present

## 2015-11-27 DIAGNOSIS — G90519 Complex regional pain syndrome I of unspecified upper limb: Secondary | ICD-10-CM | POA: Diagnosis not present

## 2015-11-27 DIAGNOSIS — M47816 Spondylosis without myelopathy or radiculopathy, lumbar region: Secondary | ICD-10-CM | POA: Diagnosis not present

## 2015-11-27 DIAGNOSIS — G8929 Other chronic pain: Secondary | ICD-10-CM | POA: Diagnosis not present

## 2015-11-27 DIAGNOSIS — Z79891 Long term (current) use of opiate analgesic: Secondary | ICD-10-CM | POA: Diagnosis not present

## 2015-12-19 ENCOUNTER — Emergency Department (HOSPITAL_BASED_OUTPATIENT_CLINIC_OR_DEPARTMENT_OTHER)
Admission: EM | Admit: 2015-12-19 | Discharge: 2015-12-19 | Disposition: A | Discharge status: Home / Self Care | Payer: Medicare Other | Attending: Emergency Medicine | Admitting: Emergency Medicine

## 2015-12-19 ENCOUNTER — Encounter (HOSPITAL_BASED_OUTPATIENT_CLINIC_OR_DEPARTMENT_OTHER): Payer: Self-pay | Admitting: Emergency Medicine

## 2015-12-19 ENCOUNTER — Emergency Department (HOSPITAL_BASED_OUTPATIENT_CLINIC_OR_DEPARTMENT_OTHER): Payer: Medicare Other

## 2015-12-19 DIAGNOSIS — Z79899 Other long term (current) drug therapy: Secondary | ICD-10-CM | POA: Diagnosis not present

## 2015-12-19 DIAGNOSIS — Z791 Long term (current) use of non-steroidal anti-inflammatories (NSAID): Secondary | ICD-10-CM | POA: Diagnosis not present

## 2015-12-19 DIAGNOSIS — F17210 Nicotine dependence, cigarettes, uncomplicated: Secondary | ICD-10-CM | POA: Insufficient documentation

## 2015-12-19 DIAGNOSIS — R1032 Left lower quadrant pain: Secondary | ICD-10-CM | POA: Insufficient documentation

## 2015-12-19 DIAGNOSIS — R0789 Other chest pain: Secondary | ICD-10-CM

## 2015-12-19 DIAGNOSIS — I7100 Dissection of unspecified site of aorta: Secondary | ICD-10-CM

## 2015-12-19 DIAGNOSIS — I700 Atherosclerosis of aorta: Secondary | ICD-10-CM | POA: Diagnosis not present

## 2015-12-19 DIAGNOSIS — I10 Essential (primary) hypertension: Secondary | ICD-10-CM | POA: Diagnosis not present

## 2015-12-19 DIAGNOSIS — R52 Pain, unspecified: Secondary | ICD-10-CM

## 2015-12-19 DIAGNOSIS — R079 Chest pain, unspecified: Secondary | ICD-10-CM | POA: Diagnosis present

## 2015-12-19 LAB — BASIC METABOLIC PANEL
Anion gap: 8 (ref 5–15)
BUN: 10 mg/dL (ref 6–20)
CALCIUM: 9.3 mg/dL (ref 8.9–10.3)
CHLORIDE: 106 mmol/L (ref 101–111)
CO2: 25 mmol/L (ref 22–32)
CREATININE: 0.75 mg/dL (ref 0.44–1.00)
GFR calc non Af Amer: >60 mL/min (ref >60)
GLUCOSE: 102 mg/dL — AB (ref 65–99)
Potassium: 3.6 mmol/L (ref 3.5–5.1)
Sodium: 139 mmol/L (ref 135–145)

## 2015-12-19 LAB — TROPONIN I: Troponin I: <0.03 ng/mL (ref <0.03)

## 2015-12-19 LAB — CBC
HCT: 46.4 % — ABNORMAL HIGH (ref 36.0–46.0)
HEMOGLOBIN: 16.2 g/dL — AB (ref 12.0–15.0)
MCH: 31.6 pg (ref 26.0–34.0)
MCHC: 34.9 g/dL (ref 30.0–36.0)
MCV: 90.6 fL (ref 78.0–100.0)
PLATELETS: 286 10*3/uL (ref 150–400)
RBC: 5.12 MIL/uL — AB (ref 3.87–5.11)
RDW: 13.7 % (ref 11.5–15.5)
WBC: 12.1 10*3/uL — ABNORMAL HIGH (ref 4.0–10.5)

## 2015-12-19 MED ORDER — METOCLOPRAMIDE HCL 5 MG/ML IJ SOLN
5.0000 mg | Freq: Once | INTRAMUSCULAR | Status: AC
  Administered 2015-12-19: 5 mg via INTRAVENOUS
  Filled 2015-12-19: qty 2

## 2015-12-19 MED ORDER — HYDROMORPHONE HCL 1 MG/ML IJ SOLN
1.0000 mg | Freq: Once | INTRAMUSCULAR | Status: AC
  Administered 2015-12-19: 1 mg via INTRAVENOUS
  Filled 2015-12-19: qty 1

## 2015-12-19 MED ORDER — IOPAMIDOL (ISOVUE-370) INJECTION 76%
100.0000 mL | Freq: Once | INTRAVENOUS | Status: AC | PRN
  Administered 2015-12-19: 100 mL via INTRAVENOUS

## 2015-12-19 MED ORDER — METOCLOPRAMIDE HCL 10 MG PO TABS: 10.0000 mg | ORAL_TABLET | Freq: Four times a day (QID) | ORAL | 0 refills | Status: DC | PRN

## 2015-12-19 NOTE — ED Provider Notes (Signed)
[facility] Provider Note   CSN: [id number] Arrival date & time: 12/19/15  1436     History   Chief Complaint Chief Complaint  Patient presents with  . Chest Pain  . Hypertension  Chief complaint chest pain and abdominal pain  HPI Dana Morrow is a 50 y.o. female.Complains of anterior chest pain onset 5 AM today pain is constant worse with coughing she's had a slight cough she denies fever. She also complains of left lower quadrant abdominal pain onset 6 AM yesterday. Pain is constant. Nothing makes pain better or worse. Other associated symptoms include nausea no vomiting no fever. Patient had similar symptoms approximately a month ago seen at [facility] where she spent overnight stay and was told to schedule an outpatient exercise stress test however she has not as of yet. Only describes pain as a 7 on a scale of 1-10. Treated with Dilantin and clonidine prior to coming here.  HPI  Past Medical History:  Diagnosis Date  . Abdominal pain, chronic, left lower quadrant    a. s/p Laproscopy in 2004 w/o finding of source of discomfort.  . Anxiety and depression 2013   Lots of stress  . Chest pain    a. reportedly nl MV and echo 10/2011 - [place]  . Cholelithiasis    a. s/p cholecystectomy 10/2011  . Colon polyps    a. s/p colonoscopy and polypectomy in 2003  . Diverticulosis    with ? of 'itis  . Family history of colon cancer    mother  . GERD (gastroesophageal reflux disease)   . Hiatal hernia    a. small by EGD 2006.  [name]. History of amaurosis fugax 01/2011   L eye; saw neuro at [facility] (Dr. Johnell ComingsMieden).  Lab w/u and carotid doppler nl per old records.  . Hyperlipidemia    LDL 191 in 2013 per old records  . Hypertension   . Migraine   . RSD (reflex sympathetic dystrophy)    a. L shoulder--pain mgmt [facility] Marlynn [facility]kingMalloy [facility] [place]  . Tobacco abuse    a. 50 pack year hx.  .  Visual disturbance    a. 12/2011 Left eye visual changes->w/u for TIA->Carotid U/S:  No ICA stenosis; Head CT: No acute abnl; MRI/MRA: mild RPCA stenosis; Echo: EF 55-60%, Triv AI, No PFO.  chronic pain of left arm, followed in pain clinixc Patient Active Problem List   Diagnosis Date Noted  . Trigeminal neuralgia of left side of face 05/10/2015  . Chest pressure 05/10/2015  . Acute bronchitis 03/27/2015    Past Surgical History:  Procedure Laterality Date  . CESAREAN SECTION  1990  . COLONOSCOPY  10/2001   Dr. Ewing SchleinMagod; diverticulosis, int/ext hem, one small polyp removed.  . ENDOMETRIAL ABLATION  2011  . ESOPHAGOGASTRODUODENOSCOPY  02/2004   Dr. Ewing SchleinMagod; normal  . LAPAROSCOPIC CHOLECYSTECTOMY  10/19/11  . LAPAROSCOPY  05/2002   Dr. Harmon Dunimothy Davis--no cause for LLQ abd pain found  . SHOULDER SURGERY Left 1997 &98  . UMBILICAL HERNIA REPAIR  2014   with mesh    OB History    No data available       Home Medications    Prior to Admission medications   Medication Sig Start Date End Date Taking? Authorizing Provider  cloNIDine (CATAPRES) 0.2 MG tablet 1 tab po tid 04/18/15  Yes Jeoffrey MassedPhilip H McGowen, MD  HYDROmorphone (DILAUDID) 2 MG tablet Take by mouth 4 (four)  times daily as needed.    Yes Historical Provider, MD  ibuprofen (ADVIL,MOTRIN) 600 MG tablet Take 1 tablet (600 mg total) by mouth 3 (three) times daily. 11/04/15  Yes Mercedes Camprubi-Soms, PA-C    Family History Family History  Problem Relation Age of Onset  . Dementia Father   . Parkinson's disease Father     d. 2013  . Hypertension Father   . Hyperlipidemia Father   . Heart disease Father   . Heart attack Mother     died @ 47  . Colon cancer Mother   . Hyperlipidemia Mother   . Heart disease Mother   . Hypertension Mother   . Esophageal cancer Brother   . Diabetes Neg Hx     Social History Social History  Substance Use Topics  . Smoking status: Current Every Day Smoker    Packs/day: 0.50    Years: 30.00      Types: Cigarettes  . Smokeless tobacco: Never Used  . Alcohol use No     Allergies   Flagyl [metronidazole]; Elavil [amitriptyline]; Klonopin [clonazepam]; and Levaquin [levofloxacin hemihydrate]   Review of Systems Review of Systems  Constitutional: Negative.   HENT: Negative.   Respiratory: Negative.   Cardiovascular: Positive for chest pain.  Gastrointestinal: Positive for abdominal pain and nausea.  Musculoskeletal: Negative.   Skin: Negative.   Neurological: Negative.   Psychiatric/Behavioral: Negative.      Physical Exam Updated Vital Signs BP (!) 183/102   Pulse 95   Temp 98.3 F (36.8 C) (Oral)   Resp 24   Ht 5\' 3"  (1.6 m)   Wt 178 lb (80.7 kg)   SpO2 100%   BMI 31.53 kg/m   Physical Exam  Constitutional: She appears well-developed and well-nourished. No distress.  HENT:  Head: Normocephalic and atraumatic.  Eyes: Conjunctivae are normal. Pupils are equal, round, and reactive to light.  Neck: Neck supple. No tracheal deviation present. No thyromegaly present.  Cardiovascular: Normal rate and regular rhythm.   No murmur heard. Pulmonary/Chest: Effort normal and breath sounds normal.  Abdominal: Soft. Bowel sounds are normal. She exhibits no distension. There is no tenderness.  Musculoskeletal: Normal range of motion. She exhibits no edema or tenderness.  Neurological: She is alert. Coordination normal.  Skin: Skin is warm and dry. No rash noted.  Psychiatric: She has a normal mood and affect.  Mildly anxious  Nursing note and vitals reviewed.    ED Treatments / Results  Labs (all labs ordered are listed, but only abnormal results are displayed) Labs Reviewed  BASIC METABOLIC PANEL  CBC  TROPONIN I    EKG  EKG Interpretation  Date/Time:  Tuesday December 19 2015 14:49:40 EST Ventricular Rate:  108 PR Interval:  138 QRS Duration: 76 QT Interval:  354 QTC Calculation: 474 R Axis:   64 Text Interpretation:  Sinus tachycardia with  Fusion complexes Possible Left atrial enlargement Anterior infarct , age undetermined Abnormal ECG No significant change since last tracing Confirmed by Ethelda Chick  MD, Lannie Heaps [phone]) on 12/19/2015 3:07:14 PM     Chest x-ray viewed by me Results for orders placed or performed during the hospital encounter of 12/19/15  Basic metabolic panel  Result Value Ref Range   Sodium 139 135 - 145 mmol/L   Potassium 3.6 3.5 - 5.1 mmol/L   Chloride 106 101 - 111 mmol/L   CO2 25 22 - 32 mmol/L   Glucose, Bld 102 (H) 65 - 99 mg/dL   BUN 10 6 -  20 mg/dL   Creatinine, Ser 1.61 0.44 - 1.00 mg/dL   Calcium 9.3 8.9 - 09.6 mg/dL   GFR calc non Af Amer >60 >60 mL/min   GFR calc Af Amer >60 >60 mL/min   Anion gap 8 5 - 15  CBC  Result Value Ref Range   WBC 12.1 (H) 4.0 - 10.5 K/uL   RBC 5.12 (H) 3.87 - 5.11 MIL/uL   Hemoglobin 16.2 (H) 12.0 - 15.0 g/dL   HCT 04.5 (H) 40.9 - 81.1 %   MCV 90.6 78.0 - 100.0 fL   MCH 31.6 26.0 - 34.0 pg   MCHC 34.9 30.0 - 36.0 g/dL   RDW 91.4 78.2 - 95.6 %   Platelets 286 150 - 400 K/uL  Troponin I  Result Value Ref Range   Troponin I <0.03 <0.03 ng/mL   Dg Chest 2 View  Result Date: 12/19/2015 CLINICAL DATA:  Chest pain. EXAM: CHEST  2 VIEW COMPARISON:  05/30/2015 . FINDINGS: Mediastinum and hilar structures normal. Lungs are clear. No pleural effusion or pneumothorax. Heart size normal. No acute bony abnormality. IMPRESSION: Low lung volumes with mild bibasilar atelectasis. Electronically Signed   By: Maisie Fus  Register   On: 12/19/2015 15:30   Ct Angio Chest Aorta W/cm &/or Wo/cm  Result Date: 12/19/2015 CLINICAL DATA:  Chest pain and back pain since this morning. Smoker. EXAM: CT ANGIOGRAPHY CHEST, ABDOMEN AND PELVIS TECHNIQUE: Multidetector CT imaging through the chest, abdomen and pelvis was performed using the standard protocol during bolus administration of intravenous contrast. Multiplanar reconstructed images and MIPs were obtained and reviewed to evaluate the  vascular anatomy. CONTRAST:  100 mL Isovue 370 COMPARISON:  [id number] and 09/06/2014 FINDINGS: CTA CHEST FINDINGS Cardiovascular: Negative for an aortic dissection or intramural hematoma. No evidence for thoracic aortic aneurysm. There is gas within the pulmonary arteries and related to the IV injection. Small amount of atherosclerotic plaque at the aortic arch. Great vessels are patent. Small amount of noncalcified plaque in the distal descending thoracic aorta. The main and lobar pulmonary arteries are patent. Mild irregularity near the origin of the left subclavian artery may be related to motion artifact and this area is poorly characterized. Mediastinum/Nodes: Esophagus is unremarkable. No chest lymphadenopathy. No significant pericardial fluid. Lungs/Pleura: Trachea and mainstem bronchi are patent. No pleural effusions. Subtle patchy densities in the lungs particularly in the left lower lobe. Findings could represent atelectasis. No large areas of airspace disease or consolidation. Subtle nodular density in left lower lobe on sequence 11, image 41. This roughly measures 4 mm but appears to be stable from an abdominal CT on 11/09/2013. Musculoskeletal: No acute bone abnormality. Review of the MIP images confirms the above findings. CTA ABDOMEN AND PELVIS FINDINGS VASCULAR Aorta: Atherosclerotic disease in the abdominal aorta, particularly in the infrarenal abdominal aorta. The infrarenal abdominal aorta is irregular with focal ectasia just above the IMA, measuring up to 1.7 cm. The abdominal aorta proximal to the ectasia measures roughly 1.4 cm. This finding has not significantly changed from the recent comparison examinations. No surrounding fluid or edema in this area. Celiac: Patent without evidence of aneurysm, dissection, vasculitis or significant stenosis. SMA: Patent without evidence of aneurysm, dissection, vasculitis or significant stenosis. Renals: Mild atherosclerotic disease involving the right  renal artery without significant stenosis. Bilateral renal arteries are patent without aneurysm, dissection or vasculitis. IMA: IMA is patent. Inflow: Mild atherosclerotic disease in the iliac arteries without aneurysm or significant stenosis. No evidence for dissection. Proximal femoral arteries are  patent bilaterally. Veins: No obvious venous abnormality within the limitations of this arterial phase study. Review of the MIP images confirms the above findings. NON-VASCULAR Hepatobiliary: Gallbladder has been removed. No gross abnormality to liver. Again noted is dilatation of the extrahepatic bile duct. Distal common bile duct measures 1 cm and minimally changed. Pancreas: Normal appearance of the pancreas without inflammation or duct dilatation. Spleen: Normal appearance of spleen without enlargement. Adrenals/Urinary Tract: Normal adrenal glands. Mild scarring in the left kidney interpolar region is unchanged. No evidence for hydronephrosis. Urinary bladder is unremarkable. Stomach/Bowel: Diverticulosis involving the sigmoid colon without acute inflammation. Additional diverticula scattered throughout the colon. Normal appendix. Normal appearance of the terminal ileum. No evidence for bowel distention. Lymphatic: No significant lymph node enlargement in the abdomen or pelvis. Small periaortic lymph nodes are stable. Reproductive: Uterus and bilateral adnexa are unremarkable. Other: No free fluid.  No free air. Musculoskeletal: No acute bone abnormality. Review of the MIP images confirms the above findings. IMPRESSION: No acute abnormality in the chest, abdomen or pelvis. Negative for an aortic dissection. Atherosclerotic disease involving the aorta with irregularity and ectasia involving the infrarenal abdominal aorta as described. Distal abdominal aorta measures up to 1.7 cm. Post cholecystectomy. Stable dilatation of the extrahepatic biliary system. Subtle patchy densities in both lungs are nonspecific but  probably related to atelectasis. Electronically Signed   By: Richarda Overlie M.D.   On: 12/19/2015 16:38   Ct Angio Abd/pel W/ And/or W/o  Result Date: 12/19/2015 CLINICAL DATA:  Chest pain and back pain since this morning. Smoker. EXAM: CT ANGIOGRAPHY CHEST, ABDOMEN AND PELVIS TECHNIQUE: Multidetector CT imaging through the chest, abdomen and pelvis was performed using the standard protocol during bolus administration of intravenous contrast. Multiplanar reconstructed images and MIPs were obtained and reviewed to evaluate the vascular anatomy. CONTRAST:  100 mL Isovue 370 COMPARISON:  [id number] and 09/06/2014 FINDINGS: CTA CHEST FINDINGS Cardiovascular: Negative for an aortic dissection or intramural hematoma. No evidence for thoracic aortic aneurysm. There is gas within the pulmonary arteries and related to the IV injection. Small amount of atherosclerotic plaque at the aortic arch. Great vessels are patent. Small amount of noncalcified plaque in the distal descending thoracic aorta. The main and lobar pulmonary arteries are patent. Mild irregularity near the origin of the left subclavian artery may be related to motion artifact and this area is poorly characterized. Mediastinum/Nodes: Esophagus is unremarkable. No chest lymphadenopathy. No significant pericardial fluid. Lungs/Pleura: Trachea and mainstem bronchi are patent. No pleural effusions. Subtle patchy densities in the lungs particularly in the left lower lobe. Findings could represent atelectasis. No large areas of airspace disease or consolidation. Subtle nodular density in left lower lobe on sequence 11, image 41. This roughly measures 4 mm but appears to be stable from an abdominal CT on 11/09/2013. Musculoskeletal: No acute bone abnormality. Review of the MIP images confirms the above findings. CTA ABDOMEN AND PELVIS FINDINGS VASCULAR Aorta: Atherosclerotic disease in the abdominal aorta, particularly in the infrarenal abdominal aorta. The  infrarenal abdominal aorta is irregular with focal ectasia just above the IMA, measuring up to 1.7 cm. The abdominal aorta proximal to the ectasia measures roughly 1.4 cm. This finding has not significantly changed from the recent comparison examinations. No surrounding fluid or edema in this area. Celiac: Patent without evidence of aneurysm, dissection, vasculitis or significant stenosis. SMA: Patent without evidence of aneurysm, dissection, vasculitis or significant stenosis. Renals: Mild atherosclerotic disease involving the right renal artery without significant stenosis. Bilateral renal  arteries are patent without aneurysm, dissection or vasculitis. IMA: IMA is patent. Inflow: Mild atherosclerotic disease in the iliac arteries without aneurysm or significant stenosis. No evidence for dissection. Proximal femoral arteries are patent bilaterally. Veins: No obvious venous abnormality within the limitations of this arterial phase study. Review of the MIP images confirms the above findings. NON-VASCULAR Hepatobiliary: Gallbladder has been removed. No gross abnormality to liver. Again noted is dilatation of the extrahepatic bile duct. Distal common bile duct measures 1 cm and minimally changed. Pancreas: Normal appearance of the pancreas without inflammation or duct dilatation. Spleen: Normal appearance of spleen without enlargement. Adrenals/Urinary Tract: Normal adrenal glands. Mild scarring in the left kidney interpolar region is unchanged. No evidence for hydronephrosis. Urinary bladder is unremarkable. Stomach/Bowel: Diverticulosis involving the sigmoid colon without acute inflammation. Additional diverticula scattered throughout the colon. Normal appendix. Normal appearance of the terminal ileum. No evidence for bowel distention. Lymphatic: No significant lymph node enlargement in the abdomen or pelvis. Small periaortic lymph nodes are stable. Reproductive: Uterus and bilateral adnexa are unremarkable. Other:  No free fluid.  No free air. Musculoskeletal: No acute bone abnormality. Review of the MIP images confirms the above findings. IMPRESSION: No acute abnormality in the chest, abdomen or pelvis. Negative for an aortic dissection. Atherosclerotic disease involving the aorta with irregularity and ectasia involving the infrarenal abdominal aorta as described. Distal abdominal aorta measures up to 1.7 cm. Post cholecystectomy. Stable dilatation of the extrahepatic biliary system. Subtle patchy densities in both lungs are nonspecific but probably related to atelectasis. Electronically Signed   By: Richarda OverlieAdam  Henn M.D.   On: 12/19/2015 16:38    Radiology No results found.  Procedures Procedures (including critical care time)  Medications Ordered in ED Medications  HYDROmorphone (DILAUDID) injection 1 mg (not administered)     Initial Impression / Assessment and Plan / ED Course  I have reviewed the triage vital signs and the nursing notes.  Pertinent labs & imaging results that were available during my care of the patient were reviewed by me and considered in my medical decision making (see chart for details).  Clinical Course    6:05 PM pain and nausea well controlled after treatment with intravenous opioids and intravenous antiemetics. She is able to drink without difficulty. I counseled patient for 5 minutes on smoking cessation .prescription Reglan  Symptoms highly atypical for cardiac etiology. Aortic dissection ruled out by CT angiogram. Heart score was 3. Final Clinical Impressions(s) / ED Diagnoses  Diagnoses #1 atypical chest pain #2 left lower quadrant abdominal pain #3 tobacco abuse Final diagnoses:  None    New Prescriptions New Prescriptions   No medications on file     Doug SouSam Amany Rando, MD 12/19/15 1810

## 2015-12-19 NOTE — ED Triage Notes (Signed)
Patient with central chest pain, onset yesterday. Patient c/o lightheadedness, dizziness, nausea, back pain. [facility] for work [facility]. Patient hypertensive in triage. Patient is no stick on left side, bracelet applied. Patient reports her mother had a heart attack at age 51.

## 2015-12-19 NOTE — ED Notes (Signed)
Patient transported to CT 

## 2015-12-19 NOTE — ED Notes (Signed)
Patient transported to X-ray 

## 2015-12-19 NOTE — Discharge Instructions (Signed)
Call Dr.McGowen's office tomorrow for referral to a cardiologist or you can ask him to arrange stress test. Ask him to help you to stop smoking. Take the medication prescribed as needed for nausea. Return if concern for any reason

## 2015-12-19 NOTE — ED Triage Notes (Signed)
Patient triage done by this nurse, documented in error under Gennaro AfricaGeorge Tyson, EMT.

## 2015-12-26 DIAGNOSIS — Z79891 Long term (current) use of opiate analgesic: Secondary | ICD-10-CM | POA: Diagnosis not present

## 2015-12-26 DIAGNOSIS — M47816 Spondylosis without myelopathy or radiculopathy, lumbar region: Secondary | ICD-10-CM | POA: Diagnosis not present

## 2015-12-26 DIAGNOSIS — G8929 Other chronic pain: Secondary | ICD-10-CM | POA: Diagnosis not present

## 2015-12-26 DIAGNOSIS — G90519 Complex regional pain syndrome I of unspecified upper limb: Secondary | ICD-10-CM | POA: Diagnosis not present

## 2015-12-27 DIAGNOSIS — M47816 Spondylosis without myelopathy or radiculopathy, lumbar region: Secondary | ICD-10-CM | POA: Diagnosis not present

## 2015-12-27 DIAGNOSIS — G8929 Other chronic pain: Secondary | ICD-10-CM | POA: Diagnosis not present

## 2016-01-15 DIAGNOSIS — F17210 Nicotine dependence, cigarettes, uncomplicated: Secondary | ICD-10-CM | POA: Diagnosis not present

## 2016-01-15 DIAGNOSIS — I10 Essential (primary) hypertension: Secondary | ICD-10-CM | POA: Diagnosis not present

## 2016-01-15 DIAGNOSIS — Z888 Allergy status to other drugs, medicaments and biological substances status: Secondary | ICD-10-CM | POA: Diagnosis not present

## 2016-01-15 DIAGNOSIS — R079 Chest pain, unspecified: Secondary | ICD-10-CM | POA: Diagnosis not present

## 2016-01-15 DIAGNOSIS — R112 Nausea with vomiting, unspecified: Secondary | ICD-10-CM | POA: Diagnosis not present

## 2016-01-15 DIAGNOSIS — R1032 Left lower quadrant pain: Secondary | ICD-10-CM | POA: Diagnosis not present

## 2016-01-15 DIAGNOSIS — Z79899 Other long term (current) drug therapy: Secondary | ICD-10-CM | POA: Diagnosis not present

## 2016-01-15 DIAGNOSIS — K449 Diaphragmatic hernia without obstruction or gangrene: Secondary | ICD-10-CM | POA: Diagnosis not present

## 2016-01-15 DIAGNOSIS — R1012 Left upper quadrant pain: Secondary | ICD-10-CM | POA: Diagnosis not present

## 2016-01-15 DIAGNOSIS — K5790 Diverticulosis of intestine, part unspecified, without perforation or abscess without bleeding: Secondary | ICD-10-CM | POA: Diagnosis not present

## 2016-01-15 DIAGNOSIS — Z87442 Personal history of urinary calculi: Secondary | ICD-10-CM | POA: Diagnosis not present

## 2016-01-15 DIAGNOSIS — Z883 Allergy status to other anti-infective agents status: Secondary | ICD-10-CM | POA: Diagnosis not present

## 2016-01-15 DIAGNOSIS — E785 Hyperlipidemia, unspecified: Secondary | ICD-10-CM | POA: Diagnosis not present

## 2016-01-15 DIAGNOSIS — Z8719 Personal history of other diseases of the digestive system: Secondary | ICD-10-CM | POA: Diagnosis not present

## 2016-01-15 DIAGNOSIS — F419 Anxiety disorder, unspecified: Secondary | ICD-10-CM | POA: Diagnosis not present

## 2016-01-16 ENCOUNTER — Other Ambulatory Visit: Payer: Self-pay | Admitting: Family Medicine

## 2016-01-16 NOTE — Telephone Encounter (Signed)
Pt is not due for refill til April.

## 2016-01-30 DIAGNOSIS — G90519 Complex regional pain syndrome I of unspecified upper limb: Secondary | ICD-10-CM | POA: Diagnosis not present

## 2016-01-30 DIAGNOSIS — G8929 Other chronic pain: Secondary | ICD-10-CM | POA: Diagnosis not present

## 2016-01-30 DIAGNOSIS — M47816 Spondylosis without myelopathy or radiculopathy, lumbar region: Secondary | ICD-10-CM | POA: Diagnosis not present

## 2016-01-30 DIAGNOSIS — Z79891 Long term (current) use of opiate analgesic: Secondary | ICD-10-CM | POA: Diagnosis not present

## 2016-02-12 DIAGNOSIS — M47816 Spondylosis without myelopathy or radiculopathy, lumbar region: Secondary | ICD-10-CM | POA: Diagnosis not present

## 2016-02-12 DIAGNOSIS — G8929 Other chronic pain: Secondary | ICD-10-CM | POA: Diagnosis not present

## 2016-02-23 ENCOUNTER — Other Ambulatory Visit: Payer: Self-pay

## 2016-02-23 ENCOUNTER — Emergency Department (HOSPITAL_BASED_OUTPATIENT_CLINIC_OR_DEPARTMENT_OTHER)
Admission: EM | Admit: 2016-02-23 | Discharge: 2016-02-23 | Disposition: A | Discharge status: Home / Self Care | Payer: Medicare Other | Attending: Emergency Medicine | Admitting: Emergency Medicine

## 2016-02-23 ENCOUNTER — Emergency Department (HOSPITAL_BASED_OUTPATIENT_CLINIC_OR_DEPARTMENT_OTHER): Payer: Medicare Other

## 2016-02-23 ENCOUNTER — Encounter (HOSPITAL_BASED_OUTPATIENT_CLINIC_OR_DEPARTMENT_OTHER): Payer: Self-pay

## 2016-02-23 DIAGNOSIS — R0789 Other chest pain: Secondary | ICD-10-CM | POA: Diagnosis not present

## 2016-02-23 DIAGNOSIS — F17210 Nicotine dependence, cigarettes, uncomplicated: Secondary | ICD-10-CM | POA: Insufficient documentation

## 2016-02-23 DIAGNOSIS — I10 Essential (primary) hypertension: Secondary | ICD-10-CM | POA: Diagnosis not present

## 2016-02-23 DIAGNOSIS — R079 Chest pain, unspecified: Secondary | ICD-10-CM

## 2016-02-23 DIAGNOSIS — R05 Cough: Secondary | ICD-10-CM | POA: Diagnosis not present

## 2016-02-23 LAB — CBC WITH DIFFERENTIAL/PLATELET
BASOS ABS: 0.1 10*3/uL (ref 0.0–0.1)
BASOS PCT: 1 %
Eosinophils Absolute: 0.2 10*3/uL (ref 0.0–0.7)
Eosinophils Relative: 2 %
HEMATOCRIT: 46.2 % — AB (ref 36.0–46.0)
HEMOGLOBIN: 16.1 g/dL — AB (ref 12.0–15.0)
LYMPHS PCT: 33 %
Lymphs Abs: 3.8 10*3/uL (ref 0.7–4.0)
MCH: 31.4 pg (ref 26.0–34.0)
MCHC: 34.8 g/dL (ref 30.0–36.0)
MCV: 90.1 fL (ref 78.0–100.0)
MONO ABS: 0.7 10*3/uL (ref 0.1–1.0)
Monocytes Relative: 6 %
NEUTROS ABS: 6.6 10*3/uL (ref 1.7–7.7)
NEUTROS PCT: 58 %
Platelets: 283 10*3/uL (ref 150–400)
RBC: 5.13 MIL/uL — ABNORMAL HIGH (ref 3.87–5.11)
RDW: 14.2 % (ref 11.5–15.5)
WBC: 11.4 10*3/uL — ABNORMAL HIGH (ref 4.0–10.5)

## 2016-02-23 LAB — URINALYSIS, ROUTINE W REFLEX MICROSCOPIC
BILIRUBIN URINE: NEGATIVE
Glucose, UA: NEGATIVE mg/dL
HGB URINE DIPSTICK: NEGATIVE
KETONES UR: NEGATIVE mg/dL
Leukocytes, UA: NEGATIVE
NITRITE: NEGATIVE
PH: 6 (ref 5.0–8.0)
Protein, ur: NEGATIVE mg/dL
SPECIFIC GRAVITY, URINE: 1.004 — AB (ref 1.005–1.030)

## 2016-02-23 LAB — COMPREHENSIVE METABOLIC PANEL
ALBUMIN: 4.3 g/dL (ref 3.5–5.0)
ALK PHOS: 72 U/L (ref 38–126)
ALT: 11 U/L — ABNORMAL LOW (ref 14–54)
ANION GAP: 9 (ref 5–15)
AST: 14 U/L — ABNORMAL LOW (ref 15–41)
BILIRUBIN TOTAL: 0.6 mg/dL (ref 0.3–1.2)
BUN: 10 mg/dL (ref 6–20)
CALCIUM: 9.2 mg/dL (ref 8.9–10.3)
CO2: 24 mmol/L (ref 22–32)
Chloride: 106 mmol/L (ref 101–111)
Creatinine, Ser: 0.77 mg/dL (ref 0.44–1.00)
GFR calc non Af Amer: >60 mL/min (ref >60)
Glucose, Bld: 96 mg/dL (ref 65–99)
POTASSIUM: 3.8 mmol/L (ref 3.5–5.1)
Sodium: 139 mmol/L (ref 135–145)
TOTAL PROTEIN: 8.1 g/dL (ref 6.5–8.1)

## 2016-02-23 LAB — I-STAT TROPONIN, ED: Troponin i, poc: 0.03 ng/mL (ref 0.00–0.08)

## 2016-02-23 MED ORDER — HYDROMORPHONE HCL 1 MG/ML IJ SOLN
2.0000 mg | Freq: Once | INTRAMUSCULAR | Status: AC
  Administered 2016-02-23: 2 mg via INTRAMUSCULAR
  Filled 2016-02-23: qty 2

## 2016-02-23 MED ORDER — KETOROLAC TROMETHAMINE 60 MG/2ML IM SOLN
60.0000 mg | Freq: Once | INTRAMUSCULAR | Status: DC
  Filled 2016-02-23: qty 2

## 2016-02-23 MED ORDER — ONDANSETRON 8 MG PO TBDP
8.0000 mg | ORAL_TABLET | Freq: Once | ORAL | Status: AC
  Administered 2016-02-23: 8 mg via ORAL
  Filled 2016-02-23: qty 1

## 2016-02-23 NOTE — ED Provider Notes (Signed)
[facility] Provider Note   CSN: [id number] Arrival date & time: 02/23/16  1215     History   Chief Complaint Chief Complaint  Patient presents with  . Chest Pain    HPI Dana Morrow is a 51 y.o. female.  Patient is a 51 year old female with past medical history of RSD, GERD, prior cholecystectomy. She presents for evaluation of chest and flank discomfort. She started yesterday with pain in her left flank. She denies to me any urinary complaints. She denies any fevers or chills. She also reports starting yesterday evening with a pain in the center of her chest. This is not associated with any shortness of breath, nausea, diaphoresis, or radiation to the arm or jaw. She denies any prior cardiac history and denies any recent exertional symptoms. She has a family history of heart disease and does use cigarettes, however has no other risk factors.   The history is provided by the patient.  Chest Pain   This is a new problem. The current episode started yesterday. The problem occurs constantly. The problem has not changed since onset.The pain is associated with coughing and movement (Position). The pain is moderate. The quality of the pain is described as pressure-like. The pain does not radiate. Associated symptoms include cough. Pertinent negatives include no diaphoresis, no fever, no palpitations and no shortness of breath.    Past Medical History:  Diagnosis Date  . Abdominal pain, chronic, left lower quadrant    a. s/p Laproscopy in 2004 w/o finding of source of discomfort.  . Anxiety and depression 2013   Lots of stress  . Chest pain    a. reportedly nl MV and echo 10/2011 - [place]  . Cholelithiasis    a. s/p cholecystectomy 10/2011  . Colon polyps    a. s/p colonoscopy and polypectomy in 2003  . Diverticulosis    with ? of 'itis  . Family history of colon cancer    mother  . GERD (gastroesophageal reflux disease)   . Hiatal hernia    a. small by EGD  2006.  [name]. History of amaurosis fugax 01/2011   L eye; saw neuro at [facility] (Dr. Johnell ComingsMieden).  Lab w/u and carotid doppler nl per old records.  . Hyperlipidemia    LDL 191 in 2013 per old records  . Hypertension   . Migraine   . RSD (reflex sympathetic dystrophy)    a. L shoulder--pain mgmt with Dr[facility] Marlynn [facility]kingMalloy [facility] [place]  . Tobacco abuse    a. 50 pack year hx.  . Visual disturbance    a. 12/2011 Left eye visual changes->w/u for TIA->Carotid U/S:  No ICA stenosis; Head CT: No acute abnl; MRI/MRA: mild RPCA stenosis; Echo: EF 55-60%, Triv AI, No PFO.    Patient Active Problem List   Diagnosis Date Noted  . Trigeminal neuralgia of left side of face 05/10/2015  . Chest pressure 05/10/2015  . Acute bronchitis 03/27/2015    Past Surgical History:  Procedure Laterality Date  . CESAREAN SECTION  1990  . COLONOSCOPY  10/2001   Dr. Ewing SchleinMagod; diverticulosis, int/ext hem, one small polyp removed.  . ENDOMETRIAL ABLATION  2011  . ESOPHAGOGASTRODUODENOSCOPY  02/2004   Dr. Ewing SchleinMagod; normal  . LAPAROSCOPIC CHOLECYSTECTOMY  10/19/11  . LAPAROSCOPY  05/2002   Dr. Harmon Dunimothy Davis--no cause for LLQ abd pain found  . SHOULDER SURGERY Left 1997 &98  . UMBILICAL HERNIA REPAIR  2014   with mesh  OB History    No data available       Home Medications    Prior to Admission medications   Medication Sig Start Date End Date Taking? Authorizing Provider  cloNIDine (CATAPRES) 0.2 MG tablet 1 tab po tid 04/18/15   Jeoffrey Massed, MD  HYDROmorphone (DILAUDID) 2 MG tablet Take by mouth 4 (four) times daily as needed.     Historical Provider, MD  ibuprofen (ADVIL,MOTRIN) 600 MG tablet Take 1 tablet (600 mg total) by mouth 3 (three) times daily. 11/04/15   Mercedes Street, PA-C    Family History Family History  Problem Relation Age of Onset  . Dementia Father   . Parkinson's disease Father     d. 2013  . Hypertension Father   . Hyperlipidemia  Father   . Heart disease Father   . Heart attack Mother     died @ 76  . Colon cancer Mother   . Hyperlipidemia Mother   . Heart disease Mother   . Hypertension Mother   . Esophageal cancer Brother   . Diabetes Neg Hx     Social History Social History  Substance Use Topics  . Smoking status: Current Every Day Smoker    Packs/day: 0.50    Years: 30.00    Types: Cigarettes  . Smokeless tobacco: Never Used  . Alcohol use No     Allergies   Flagyl [metronidazole]; Elavil [amitriptyline]; Klonopin [clonazepam]; and Levaquin [levofloxacin hemihydrate]   Review of Systems Review of Systems  Constitutional: Negative for diaphoresis and fever.  Respiratory: Positive for cough. Negative for shortness of breath.   Cardiovascular: Positive for chest pain. Negative for palpitations.  All other systems reviewed and are negative.    Physical Exam Updated Vital Signs BP 163/88 (BP Location: Right Arm)   Pulse 89   Temp 98 F (36.7 C) (Oral)   Resp 18   Ht 5\' 3"  (1.6 m)   Wt 182 lb (82.6 kg)   SpO2 100%   BMI 32.24 kg/m   Physical Exam  Constitutional: She is oriented to person, place, and time. She appears well-developed and well-nourished. No distress.  HENT:  Head: Normocephalic and atraumatic.  Neck: Normal range of motion. Neck supple.  Cardiovascular: Normal rate and regular rhythm.  Exam reveals no gallop and no friction rub.   No murmur heard. Pulmonary/Chest: Effort normal and breath sounds normal. No respiratory distress. She has no wheezes. She exhibits tenderness.  There is tenderness in the anterior chest wall. This reproduces her symptoms.  Abdominal: Soft. Bowel sounds are normal. She exhibits no distension. There is no tenderness.  Musculoskeletal: Normal range of motion.  Neurological: She is alert and oriented to person, place, and time.  Skin: Skin is warm and dry. She is not diaphoretic.  Nursing note and vitals reviewed.    ED Treatments /  Results  Labs (all labs ordered are listed, but only abnormal results are displayed) Labs Reviewed  URINALYSIS, ROUTINE W REFLEX MICROSCOPIC - Abnormal; Notable for the following:       Result Value   Specific Gravity, Urine 1.004 (*)    All other components within normal limits  COMPREHENSIVE METABOLIC PANEL  CBC WITH DIFFERENTIAL/PLATELET  TROPONIN I    EKG  EKG Interpretation  Date/Time:  Friday February 23 2016 12:22:04 EST Ventricular Rate:  104 PR Interval:  146 QRS Duration: 76 QT Interval:  360 QTC Calculation: 473 R Axis:   23 Text Interpretation:  Sinus tachycardia Possible Left  atrial enlargement Borderline ECG No significant change since last tracing Confirmed by Ethelda Chick  MD, SAM [phone]) on 02/23/2016 12:35:32 PM       Radiology Dg Chest 2 View  Result Date: 02/23/2016 CLINICAL DATA:  Cough since yesterday.  Chills. EXAM: CHEST  2 VIEW COMPARISON:  None. FINDINGS: There is no focal parenchymal opacity. There is no pleural effusion or pneumothorax. The heart and mediastinal contours are unremarkable. The osseous structures are unremarkable. IMPRESSION: No active cardiopulmonary disease. Electronically Signed   By: Elige Ko   On: 02/23/2016 12:52    Procedures Procedures (including critical care time)  Medications Ordered in ED Medications  ketorolac (TORADOL) injection 60 mg (not administered)     Initial Impression / Assessment and Plan / ED Course  I have reviewed the triage vital signs and the nursing notes.  Pertinent labs & imaging results that were available during my care of the patient were reviewed by me and considered in my medical decision making (see chart for details).  Patient presents with complaints of chest discomfort. This is been ongoing for nearly 16 hours. She has no EKG changes and troponin is negative. Remainder the workup is unremarkable as well. I highly suspect a musculoskeletal etiology. She has a history of RSD and has pain  medication she takes at home. I will advise her to continue this and follow up with her primary Dr. if not improving.  Final Clinical Impressions(s) / ED Diagnoses   Final diagnoses:  None    New Prescriptions New Prescriptions   No medications on file     Geoffery Lyons, MD 02/23/16 1657

## 2016-02-23 NOTE — ED Triage Notes (Signed)
Pt added after c/o blood in urine and prod cough

## 2016-02-23 NOTE — ED Notes (Signed)
Pt refused toradol states it does not help her. MD aware.

## 2016-02-23 NOTE — Discharge Instructions (Signed)
Continue your pain medications as before.  Return to the emergency department if you develop worsening pain, high fevers, difficulty breathing, or other new and concerning symptoms.

## 2016-02-23 NOTE — ED Triage Notes (Signed)
CP started last night-NAD-steady gait 

## 2016-02-28 DIAGNOSIS — M47816 Spondylosis without myelopathy or radiculopathy, lumbar region: Secondary | ICD-10-CM | POA: Diagnosis not present

## 2016-02-28 DIAGNOSIS — F329 Major depressive disorder, single episode, unspecified: Secondary | ICD-10-CM | POA: Diagnosis not present

## 2016-02-28 DIAGNOSIS — Z79891 Long term (current) use of opiate analgesic: Secondary | ICD-10-CM | POA: Diagnosis not present

## 2016-02-28 DIAGNOSIS — G8929 Other chronic pain: Secondary | ICD-10-CM | POA: Diagnosis not present

## 2016-02-28 DIAGNOSIS — M5416 Radiculopathy, lumbar region: Secondary | ICD-10-CM | POA: Diagnosis not present

## 2016-03-14 ENCOUNTER — Ambulatory Visit (INDEPENDENT_AMBULATORY_CARE_PROVIDER_SITE_OTHER): Payer: Medicare Other | Admitting: Family Medicine

## 2016-03-14 ENCOUNTER — Encounter: Payer: Self-pay | Admitting: Family Medicine

## 2016-03-14 VITALS — BP 160/80 | HR 103 | Temp 98.6°F | Ht 63.0 in | Wt 200.4 lb

## 2016-03-14 DIAGNOSIS — R030 Elevated blood-pressure reading, without diagnosis of hypertension: Secondary | ICD-10-CM | POA: Diagnosis not present

## 2016-03-14 DIAGNOSIS — J209 Acute bronchitis, unspecified: Secondary | ICD-10-CM | POA: Diagnosis not present

## 2016-03-14 MED ORDER — BENZONATATE 100 MG PO CAPS: 100.0000 mg | ORAL_CAPSULE | Freq: Three times a day (TID) | ORAL | 0 refills | Status: DC | PRN

## 2016-03-14 MED ORDER — ALBUTEROL SULFATE HFA 108 (90 BASE) MCG/ACT IN AERS: 1.0000 | INHALATION_SPRAY | Freq: Four times a day (QID) | RESPIRATORY_TRACT | 1 refills | Status: DC | PRN

## 2016-03-14 NOTE — Patient Instructions (Addendum)
Continue to push fluids, practice good hand hygiene, and cover your mouth if you cough.  If you start having fevers, shaking or shortness of breath, seek immediate care.  Kegel Exercises Kegel exercises help strengthen the muscles that support the rectum, vagina, small intestine, bladder, and uterus. Doing Kegel exercises can help:  Improve bladder and bowel control.  Improve sexual response.  Reduce problems and discomfort during pregnancy. Kegel exercises involve squeezing your pelvic floor muscles, which are the same muscles you squeeze when you try to stop the flow of urine. The exercises can be done while sitting, standing, or lying down, but it is best to vary your position. Phase 1 exercises 1. Squeeze your pelvic floor muscles tight. You should feel a tight lift in your rectal area. If you are a female, you should also feel a tightness in your vaginal area. Keep your stomach, buttocks, and legs relaxed. 2. Hold the muscles tight for up to 10 seconds. 3. Relax your muscles. Repeat this exercise 50 times a day or as many times as told by your health care provider. Continue to do this exercise for at least 4-6 weeks or for as long as told by your health care provider. This information is not intended to replace advice given to you by your health care provider. Make sure you discuss any questions you have with your health care provider. Document Released: 12/11/2011 Document Revised: 08/19/2015 Document Reviewed: 11/13/2014 Elsevier Interactive Patient Education  2017 [employer]

## 2016-03-14 NOTE — Progress Notes (Signed)
Chief Complaint  Patient presents with  . Cough    product-green,chest congestion-sxs started on Tues    Dana Morrow here for URI complaints.  Duration: 2 days  Associated symptoms: fevers (100.1 F), productive cough, nasal congestion, rhinorrhea, ST Denies: shortness of breath, myalgia and fevers/rigors Treatment to date: Alka seltzer, ibuprofen Sick contacts: No  She is a long time smoker.  She will lose control of bladder when she coughs.  ROS:  Const: Denies fevers HEENT: As noted in HPI Lungs: No SOB  Past Medical History:  Diagnosis Date  . Abdominal pain, chronic, left lower quadrant    a. s/p Laproscopy in 2004 w/o finding of source of discomfort.  . Anxiety and depression 2013   Lots of stress  . Chest pain    a. reportedly nl MV and echo 10/2011 - [place]  . Cholelithiasis    a. s/p cholecystectomy 10/2011  . Colon polyps    a. s/p colonoscopy and polypectomy in 2003  . Diverticulosis    with ? of 'itis  . Family history of colon cancer    mother  . GERD (gastroesophageal reflux disease)   . Hiatal hernia    a. small by EGD 2006.  [name] History of amaurosis fugax 01/2011   L eye; saw neuro at [facility] (Dr. Johnell Comings).  Lab w/u and carotid doppler nl per old records.  . Hyperlipidemia    LDL 191 in 2013 per old records  . Hypertension   . Migraine   . RSD (reflex sympathetic dystrophy)    a. L shoulder--pain mgmt with Dr. Marlynn Perking at [facility] [place]  . Tobacco abuse    a. 50 pack year hx.  . Visual disturbance    a. 12/2011 Left eye visual changes->w/u for TIA->Carotid U/S:  No ICA stenosis; Head CT: No acute abnl; MRI/MRA: mild RPCA stenosis; Echo: EF 55-60%, Triv AI, No PFO.   Family History  Problem Relation Age of Onset  . Dementia Father   . Parkinson's disease Father     d. 2013  . Hypertension Father   . Hyperlipidemia Father   . Heart disease Father   . Heart attack Mother     died @ 18   . Colon cancer Mother   . Hyperlipidemia Mother   . Heart disease Mother   . Hypertension Mother   . Esophageal cancer Brother   . Diabetes Neg Hx     BP (!) 160/80 (BP Location: Right Arm, Patient Position: Sitting, Cuff Size: Large)   Pulse (!) 103   Temp 98.6 F (37 C) (Oral)   Ht 5\' 3"  (1.6 m)   Wt 200 lb 6.4 oz (90.9 kg)   SpO2 99%   BMI 35.50 kg/m  General: Awake, alert, appears stated age HEENT: AT, [place], ears patent b/l and TM's neg, nares patent w/o discharge, pharynx pink and without exudates, MMM Neck: No masses or asymmetry Heart: RRR, no murmurs, no bruits Lungs: CTAB, no accessory muscle use Psych: Age appropriate judgment and insight, normal mood and affect  Acute bronchitis, unspecified organism - Plan: albuterol (VENTOLIN HFA) 108 (90 Base) MCG/ACT inhaler, benzonatate (TESSALON) 100 MG capsule, DISCONTINUED: benzonatate (TESSALON) 100 MG capsule  Orders as above. Perles sent to [facility] also as they are on $4 list.  Kegel exercises. Continue to push fluids, practice good hand hygiene, cover mouth when coughing.  Kegel exercises given. If starting to experience fevers, shaking, or shortness of breath, seek immediate care. Should  f/u with Dr. Milinda CaveMcGowen for BP visit. Pt voiced understanding and agreement to the plan.  Jilda Rocheicholas Paul PerryWendling, DO 03/14/16 3:37 PM

## 2016-03-14 NOTE — Progress Notes (Signed)
Pre visit review using our clinic review tool, if applicable. No additional management support is needed unless otherwise documented below in the visit note. 

## 2016-03-16 ENCOUNTER — Other Ambulatory Visit: Payer: Self-pay | Admitting: Family Medicine

## 2016-03-19 ENCOUNTER — Emergency Department (HOSPITAL_BASED_OUTPATIENT_CLINIC_OR_DEPARTMENT_OTHER): Payer: Medicare Other

## 2016-03-19 ENCOUNTER — Encounter (HOSPITAL_BASED_OUTPATIENT_CLINIC_OR_DEPARTMENT_OTHER): Payer: Self-pay | Admitting: Emergency Medicine

## 2016-03-19 ENCOUNTER — Emergency Department (HOSPITAL_BASED_OUTPATIENT_CLINIC_OR_DEPARTMENT_OTHER)
Admission: EM | Admit: 2016-03-19 | Discharge: 2016-03-19 | Disposition: A | Discharge status: Home / Self Care | Payer: Medicare Other | Attending: Emergency Medicine | Admitting: Emergency Medicine

## 2016-03-19 DIAGNOSIS — R079 Chest pain, unspecified: Secondary | ICD-10-CM | POA: Diagnosis not present

## 2016-03-19 DIAGNOSIS — F17210 Nicotine dependence, cigarettes, uncomplicated: Secondary | ICD-10-CM | POA: Insufficient documentation

## 2016-03-19 DIAGNOSIS — R072 Precordial pain: Secondary | ICD-10-CM | POA: Diagnosis present

## 2016-03-19 DIAGNOSIS — Z79899 Other long term (current) drug therapy: Secondary | ICD-10-CM | POA: Insufficient documentation

## 2016-03-19 DIAGNOSIS — I10 Essential (primary) hypertension: Secondary | ICD-10-CM | POA: Diagnosis not present

## 2016-03-19 DIAGNOSIS — J209 Acute bronchitis, unspecified: Secondary | ICD-10-CM | POA: Insufficient documentation

## 2016-03-19 DIAGNOSIS — R05 Cough: Secondary | ICD-10-CM | POA: Diagnosis not present

## 2016-03-19 DIAGNOSIS — Z720 Tobacco use: Secondary | ICD-10-CM | POA: Diagnosis not present

## 2016-03-19 LAB — TROPONIN I

## 2016-03-19 LAB — CBC
HCT: 46.2 % — ABNORMAL HIGH (ref 36.0–46.0)
Hemoglobin: 16 g/dL — ABNORMAL HIGH (ref 12.0–15.0)
MCH: 31.1 pg (ref 26.0–34.0)
MCHC: 34.6 g/dL (ref 30.0–36.0)
MCV: 89.7 fL (ref 78.0–100.0)
Platelets: 344 10*3/uL (ref 150–400)
RBC: 5.15 MIL/uL — ABNORMAL HIGH (ref 3.87–5.11)
RDW: 14.1 % (ref 11.5–15.5)
WBC: 15.1 10*3/uL — ABNORMAL HIGH (ref 4.0–10.5)

## 2016-03-19 LAB — BASIC METABOLIC PANEL
Anion gap: 9 (ref 5–15)
BUN: 11 mg/dL (ref 6–20)
CO2: 23 mmol/L (ref 22–32)
Calcium: 9.4 mg/dL (ref 8.9–10.3)
Chloride: 105 mmol/L (ref 101–111)
Creatinine, Ser: 0.79 mg/dL (ref 0.44–1.00)
GFR calc Af Amer: >60 mL/min (ref >60)
GFR calc non Af Amer: >60 mL/min (ref >60)
Glucose, Bld: 125 mg/dL — ABNORMAL HIGH (ref 65–99)
Potassium: 4 mmol/L (ref 3.5–5.1)
Sodium: 137 mmol/L (ref 135–145)

## 2016-03-19 LAB — D-DIMER, QUANTITATIVE: D-Dimer, Quant: 0.27 ug/mL-FEU (ref 0.00–0.50)

## 2016-03-19 MED ORDER — METOCLOPRAMIDE HCL 5 MG/ML IJ SOLN
10.0000 mg | Freq: Once | INTRAMUSCULAR | Status: AC
  Administered 2016-03-19: 10 mg via INTRAVENOUS
  Filled 2016-03-19: qty 2

## 2016-03-19 MED ORDER — METOCLOPRAMIDE HCL 10 MG PO TABS: 10.0000 mg | ORAL_TABLET | Freq: Four times a day (QID) | ORAL | 0 refills | Status: DC | PRN

## 2016-03-19 NOTE — ED Provider Notes (Addendum)
[facility]Y [facility] Provider Note   CSN: [id number] Arrival date & time: 03/19/16  1101     History   Chief Complaint Chief Complaint  Patient presents with  . Chest Pain    HPI Dana Morrow is a 51 y.o. female.Complains of anterior chest pain radiating to right parasternal area and between her shoulder blades onset 4 days ago pain is accompanied by productive cough of thick clear sputum. Pain is worse with coughing. She had one episode of post tussive vomiting. No fever. Associated symptoms include nasal congestion. No other associated symptoms. Pain improved with remaining still. She was diagnosed by her PMD with bronchitis. She reports no relief is onset of illness.[name].  HPI  Past Medical History:  Diagnosis Date  . Abdominal pain, chronic, left lower quadrant    a. s/p Laproscopy in 2004 w/o finding of source of discomfort.  . Anxiety and depression 2013   Lots of stress  . Chest pain    a. reportedly nl MV and echo 10/2011 - [place]  . Cholelithiasis    a. s/p cholecystectomy 10/2011  . Colon polyps    a. s/p colonoscopy and polypectomy in 2003  . Diverticulosis    with ? of 'itis  . Family history of colon cancer    mother  . GERD (gastroesophageal reflux disease)   . Hiatal hernia    a. small by EGD 2006.  [name]. History of amaurosis fugax 01/2011   L eye; saw neuro at [facility] (Dr. Johnell ComingsMieden).  Lab w/u and carotid doppler nl per old records.  . Hyperlipidemia    LDL 191 in 2013 per old records  . Hypertension   . Migraine   . RSD (reflex sympathetic dystrophy)    a. L shoulder--pain mgmt with Dr. M[facility]Malloy [facility] [place]  . Tobacco abuse    a. 50 pack year hx.  . Visual disturbance    a. 12/2011 Left eye visual changes->w/u for TIA->Carotid U/S:  No ICA stenosis; Head CT: No acute abnl; MRI/MRA: mild RPCA stenosis; Echo: EF 55-60%, Triv AI, No PFO.  Emilie history mother had MI age 52 Hypertension. Did  not take her blood pressure medicine today. Patient Active Problem List   Diagnosis Date Noted  . Trigeminal neuralgia of left side of face 05/10/2015  . Chest pressure 05/10/2015  . Acute bronchitis 03/27/2015    Past Surgical History:  Procedure Laterality Date  . CESAREAN SECTION  1990  . COLONOSCOPY  10/2001   Dr. Ewing SchleinMagod; diverticulosis, int/ext hem, one small polyp removed.  . ENDOMETRIAL ABLATION  2011  . ESOPHAGOGASTRODUODENOSCOPY  02/2004   Dr. Ewing SchleinMagod; normal  . LAPAROSCOPIC CHOLECYSTECTOMY  10/19/11  . LAPAROSCOPY  05/2002   Dr. Harmon Dunimothy Davis--no cause for LLQ abd pain found  . SHOULDER SURGERY Left 1997 &98  . UMBILICAL HERNIA REPAIR  2014   with mesh    OB History    No data available       Home Medications    Prior to Admission medications   Medication Sig Start Date End Date Taking? Authorizing Provider  albuterol (VENTOLIN HFA) 108 (90 Base) MCG/ACT inhaler Inhale 1-2 puffs into the lungs every 6 (six) hours as needed for wheezing or shortness of breath. 03/14/16  Yes Jilda RocheNicholas Paul Wendling, DO  benzonatate (TESSALON) 100 MG capsule Take 1 capsule (100 mg total) by mouth 3 (three) times daily as needed for cough. 03/14/16  Yes Sharlene DoryNicholas Paul Wendling, DO  cloNIDine (CATAPRES)  0.2 MG tablet TAKE 1 TABLET THREE TIMES DAILY 03/18/16  Yes Jeoffrey Massed, MD  HYDROmorphone (DILAUDID) 2 MG tablet Take by mouth 4 (four) times daily as needed.    Yes Historical Provider, MD  ibuprofen (ADVIL,MOTRIN) 600 MG tablet Take 1 tablet (600 mg total) by mouth 3 (three) times daily. 11/04/15  Yes Mercedes Street, PA-C    Family History Family History  Problem Relation Age of Onset  . Dementia Father   . Parkinson's disease Father     d. 2013  . Hypertension Father   . Hyperlipidemia Father   . Heart disease Father   . Heart attack Mother     died @ 79  . Colon cancer Mother   . Hyperlipidemia Mother   . Heart disease Mother   . Hypertension Mother   . Esophageal cancer  Brother   . Diabetes Neg Hx     Social History Social History  Substance Use Topics  . Smoking status: Current Every Day Smoker    Packs/day: 0.50    Years: 30.00    Types: Cigarettes  . Smokeless tobacco: Never Used  . Alcohol use No     Allergies   Flagyl [metronidazole]; Elavil [amitriptyline]; Klonopin [clonazepam]; and Levaquin [levofloxacin hemihydrate]   Review of Systems Review of Systems  Constitutional: Negative.   HENT: Positive for congestion.   Respiratory: Positive for cough.        Hiccups intermittently for 4 days  Cardiovascular: Negative.   Gastrointestinal: Negative.   Musculoskeletal: Negative.   Skin: Negative.   Neurological: Negative.   Psychiatric/Behavioral: Negative.   All other systems reviewed and are negative.    Physical Exam Updated Vital Signs BP 180/100 (BP Location: Right Arm)   Pulse (!) 122   Temp 98.4 F (36.9 C) (Oral)   Resp (!) 27   Ht 5\' 3"  (1.6 m)   Wt 200 lb (90.7 kg)   SpO2 99%   BMI 35.43 kg/m   Physical Exam  Constitutional: She appears well-developed and well-nourished.  HENT:  Head: Normocephalic and atraumatic.  Nasal congestion  Eyes: Conjunctivae are normal. Pupils are equal, round, and reactive to light.  Neck: Neck supple. No tracheal deviation present. No thyromegaly present.  Cardiovascular: Regular rhythm.   No murmur heard. Tachycardic  Pulmonary/Chest: Effort normal and breath sounds normal.  Abdominal: Soft. Bowel sounds are normal. She exhibits no distension. There is no tenderness.  Musculoskeletal: Normal range of motion. She exhibits no edema or tenderness.  Neurological: She is alert. Coordination normal.  Skin: Skin is warm and dry. No rash noted.  Psychiatric: She has a normal mood and affect.  Nursing note and vitals reviewed.    ED Treatments / Results  Labs (all labs ordered are listed, but only abnormal results are displayed) Labs Reviewed  CBC - Abnormal; Notable for the  following:       Result Value   WBC 15.1 (*)    RBC 5.15 (*)    Hemoglobin 16.0 (*)    HCT 46.2 (*)    All other components within normal limits  BASIC METABOLIC PANEL  TROPONIN I  D-DIMER, QUANTITATIVE (NOT AT [facility])   Results for orders placed or performed during the hospital encounter of 03/19/16  Basic metabolic panel  Result Value Ref Range   Sodium 137 135 - 145 mmol/L   Potassium 4.0 3.5 - 5.1 mmol/L   Chloride 105 101 - 111 mmol/L   CO2 23 22 - 32 mmol/L  Glucose, Bld 125 (H) 65 - 99 mg/dL   BUN 11 6 - 20 mg/dL   Creatinine, Ser 1.61 0.44 - 1.00 mg/dL   Calcium 9.4 8.9 - 09.6 mg/dL   GFR calc non Af Amer >60 >60 mL/min   GFR calc Af Amer >60 >60 mL/min   Anion gap 9 5 - 15  CBC  Result Value Ref Range   WBC 15.1 (H) 4.0 - 10.5 K/uL   RBC 5.15 (H) 3.87 - 5.11 MIL/uL   Hemoglobin 16.0 (H) 12.0 - 15.0 g/dL   HCT 04.5 (H) 40.9 - 81.1 %   MCV 89.7 78.0 - 100.0 fL   MCH 31.1 26.0 - 34.0 pg   MCHC 34.6 30.0 - 36.0 g/dL   RDW 91.4 78.2 - 95.6 %   Platelets 344 150 - 400 K/uL  Troponin I  Result Value Ref Range   Troponin I <0.03 <0.03 ng/mL  D-dimer, quantitative (not at [facility])  Result Value Ref Range   D-Dimer, Quant 0.27 0.00 - 0.50 ug/mL-FEU   Dg Chest 2 View  Result Date: 03/19/2016 CLINICAL DATA:  Cough, chest pain EXAM: CHEST  2 VIEW COMPARISON:  02/23/2016 FINDINGS: Heart and mediastinal contours are within normal limits. No focal opacities or effusions. No acute bony abnormality. IMPRESSION: No active cardiopulmonary disease. Electronically Signed   By: Charlett Nose M.D.   On: 03/19/2016 11:31   Dg Chest 2 View  Result Date: 02/23/2016 CLINICAL DATA:  Cough since yesterday.  Chills. EXAM: CHEST  2 VIEW COMPARISON:  None. FINDINGS: There is no focal parenchymal opacity. There is no pleural effusion or pneumothorax. The heart and mediastinal contours are unremarkable. The osseous structures are unremarkable. IMPRESSION: No active cardiopulmonary disease.  Electronically Signed   By: Elige Ko   On: 02/23/2016 12:52   EKG  EKG Interpretation  Date/Time:  Tuesday March 19 2016 11:11:25 EDT Ventricular Rate:  115 PR Interval:    QRS Duration: 70 QT Interval:  327 QTC Calculation: 453 R Axis:   58 Text Interpretation:  Sinus tachycardia LAE, consider biatrial enlargement No significant change since last tracing Confirmed by Ethelda Chick  MD, Alexi Geibel [phone]) on 03/19/2016 11:28:19 AM      Chest x-ray viewed by me Radiology Dg Chest 2 View  Result Date: 03/19/2016 CLINICAL DATA:  Cough, chest pain EXAM: CHEST  2 VIEW COMPARISON:  02/23/2016 FINDINGS: Heart and mediastinal contours are within normal limits. No focal opacities or effusions. No acute bony abnormality. IMPRESSION: No active cardiopulmonary disease. Electronically Signed   By: Charlett Nose M.D.   On: 03/19/2016 11:31   Chest x-ray viewed by me Procedures Procedures (including critical care time)  Medications Ordered in ED Medications - No data to display  Chest x-ray viewed by me Initial Impression / Assessment and Plan / ED Course  I have reviewed the triage vital signs and the nursing notes.  Pertinent labs & imaging results that were available during my care of the patient were reviewed by me and considered in my medical decision making (see chart for details).     At 1:20 PM she complained of nausea. Treated with intravenous Reglan with relief of nausea at 1:50 PM she feels ready to go home she is able to drink without difficulty. She reports she has an inhaler and blood pressure medication waiting for her at the pharmacy. I'll write a prescription for Reglan. Patient advised to stay away from tobacco. Use inhaler as prescribed. Follow-up with PMD if not feeling  better within 7-10 days. Low pretest clinical suspicion for pulmonary embolism. Negative d-dimer Final Clinical Impressions(s) / ED Diagnoses  Diagnosis #1acute bronchitis #2 tobacco abuse Final diagnoses:    None    New Prescriptions New Prescriptions   No medications on file     Doug Sou, MD 03/19/16 1355    Doug Sou, MD 03/19/16 1356

## 2016-03-19 NOTE — ED Triage Notes (Signed)
Pt states chest pain woke her up at 530 this morning, sharp in nature, radiating to back. Pt states she did vomit. Pt recently tx for bronchitis.

## 2016-03-19 NOTE — Discharge Instructions (Signed)
Use her inhaler as directed. Take the medication prescribed as needed for nausea. Contact her primary care physician if not better in 7-10 days. Stay away from cigarettes and all forms tobacco. If you have problems stopping smoking, ask your primary care physician for help.

## 2016-03-19 NOTE — ED Notes (Signed)
ED Provider at bedside. 

## 2016-03-27 DIAGNOSIS — G500 Trigeminal neuralgia: Secondary | ICD-10-CM | POA: Diagnosis not present

## 2016-03-27 DIAGNOSIS — G8929 Other chronic pain: Secondary | ICD-10-CM | POA: Diagnosis not present

## 2016-03-27 DIAGNOSIS — Z79891 Long term (current) use of opiate analgesic: Secondary | ICD-10-CM | POA: Diagnosis not present

## 2016-03-27 DIAGNOSIS — M47816 Spondylosis without myelopathy or radiculopathy, lumbar region: Secondary | ICD-10-CM | POA: Diagnosis not present

## 2016-03-27 DIAGNOSIS — Z79899 Other long term (current) drug therapy: Secondary | ICD-10-CM | POA: Diagnosis not present

## 2016-03-27 DIAGNOSIS — M545 Low back pain: Secondary | ICD-10-CM | POA: Diagnosis not present

## 2016-04-15 DIAGNOSIS — G90519 Complex regional pain syndrome I of unspecified upper limb: Secondary | ICD-10-CM | POA: Diagnosis not present

## 2016-04-15 DIAGNOSIS — G8929 Other chronic pain: Secondary | ICD-10-CM | POA: Diagnosis not present

## 2016-04-16 ENCOUNTER — Other Ambulatory Visit: Payer: Self-pay | Admitting: Family Medicine

## 2016-04-17 ENCOUNTER — Other Ambulatory Visit: Payer: Self-pay | Admitting: Family Medicine

## 2016-04-17 NOTE — Telephone Encounter (Signed)
Pt LMOM on 04/17/16 at 10:34pm stating that she is out of her BP medication and that we denied the refill request. I reviewed pts chart and seen that her last OV for f/u HTN was 04/18/15. She was give at 30 day supply in March 2018 but was not notified that she needed an ov for more refills. I have sent in a 30 day supply with no refills and left a detailed message on pts cell vm advising that we will refill medication for 30 days but she will need a f/u ov for more refills. Okay per DPR.

## 2016-05-08 ENCOUNTER — Ambulatory Visit (INDEPENDENT_AMBULATORY_CARE_PROVIDER_SITE_OTHER): Payer: Medicare Other | Admitting: Family Medicine

## 2016-05-08 ENCOUNTER — Encounter: Payer: Self-pay | Admitting: Family Medicine

## 2016-05-08 ENCOUNTER — Ambulatory Visit: Payer: Medicare Other | Admitting: Family Medicine

## 2016-05-08 VITALS — BP 117/89 | HR 102 | Temp 98.1°F | Resp 16 | Ht 63.0 in | Wt 197.8 lb

## 2016-05-08 DIAGNOSIS — I10 Essential (primary) hypertension: Secondary | ICD-10-CM

## 2016-05-08 MED ORDER — CLONIDINE HCL 0.3 MG PO TABS: 0.3000 mg | ORAL_TABLET | Freq: Three times a day (TID) | ORAL | 1 refills | Status: DC

## 2016-05-08 NOTE — Progress Notes (Signed)
Pre visit review using our clinic review tool, if applicable. No additional management support is needed unless otherwise documented below in the visit note. 

## 2016-05-08 NOTE — Progress Notes (Signed)
OFFICE VISIT  05/08/2016   CC:  Chief Complaint  Patient presents with  . Follow-up    HTN,    HPI:    Patient is a 51 y.o.  female who presents for f/u HTN.  I have not seen her in about 1 yr. Home bp monitoring 130/90 avg.  She is walking regularly, lost about 10 lbs. Has felt better. Only occ ibuprofen. No otc decongestants.  Limits Na intake.  She has also been to the ED about 3 times in last 6 mo for very elevated bp.  In ED these bp's went back down and no med changes/additions were made.  She got a back procedure recently and this has helped her pain x 3 mo and her bp is better as a result.  No HA, dizziness, CP, SOB, or LE swelling.  Past Medical History:  Diagnosis Date  . Abdominal pain, chronic, left lower quadrant    a. s/p Laproscopy in 2004 w/o finding of source of discomfort.  . Anxiety and depression 2013   Lots of stress  . Chest pain    a. reportedly nl MV and echo 10/2011 - [place]  . Cholelithiasis    a. s/p cholecystectomy 10/2011  . Colon polyps    a. s/p colonoscopy and polypectomy in 2003  . Diverticulosis    with ? of 'itis  . Family history of colon cancer    mother  . GERD (gastroesophageal reflux disease)   . Hiatal hernia    a. small by EGD 2006.  [name] History of amaurosis fugax 01/2011   L eye; saw neuro at [facility] (Dr. Johnell Comings).  Lab w/u and carotid doppler nl per old records.  . Hyperlipidemia    LDL 191 in 2013 per old records  . Hypertension   . Migraine   . RSD (reflex sympathetic dystrophy)    a. L shoulder--pain mgmt with Dr. Marlynn Perking at [facility] [place]  . Tobacco abuse    a. 50 pack year hx.  . Visual disturbance    a. 12/2011 Left eye visual changes->w/u for TIA->Carotid U/S:  No ICA stenosis; Head CT: No acute abnl; MRI/MRA: mild RPCA stenosis; Echo: EF 55-60%, Triv AI, No PFO.    Past Surgical History:  Procedure Laterality Date  . CESAREAN SECTION  1990  .  COLONOSCOPY  10/2001   Dr. Ewing Schlein; diverticulosis, int/ext hem, one small polyp removed.  . ENDOMETRIAL ABLATION  2011  . ESOPHAGOGASTRODUODENOSCOPY  02/2004   Dr. Ewing Schlein; normal  . LAPAROSCOPIC CHOLECYSTECTOMY  10/19/11  . LAPAROSCOPY  05/2002   Dr. Harmon Dun cause for LLQ abd pain found  . SHOULDER SURGERY Left 1997 &98  . UMBILICAL HERNIA REPAIR  2014   with mesh    Outpatient Medications Prior to Visit  Medication Sig Dispense Refill  . albuterol (VENTOLIN HFA) 108 (90 Base) MCG/ACT inhaler Inhale 1-2 puffs into the lungs every 6 (six) hours as needed for wheezing or shortness of breath. 1 Inhaler 1  . HYDROmorphone (DILAUDID) 2 MG tablet Take by mouth 4 (four) times daily as needed.     [name] ibuprofen (ADVIL,MOTRIN) 600 MG tablet Take 1 tablet (600 mg total) by mouth 3 (three) times daily. 30 tablet 0  . cloNIDine (CATAPRES) 0.2 MG tablet TAKE 1 TABLET THREE TIMES DAILY 90 tablet 0  . benzonatate (TESSALON) 100 MG capsule Take 1 capsule (100 mg total) by mouth 3 (three) times daily as needed for cough. (Patient not taking: Reported  on 05/08/2016) 28 capsule 0  . metoCLOPramide (REGLAN) 10 MG tablet Take 1 tablet (10 mg total) by mouth every 6 (six) hours as needed for nausea (nausea/headache). (Patient not taking: Reported on 05/08/2016) 8 tablet 0   No facility-administered medications prior to visit.     Allergies  Allergen Reactions  . Flagyl [Metronidazole] Nausea And Vomiting    Oral form only, IV pt can tolerate  . Elavil [Amitriptyline]     hallucinations  . Klonopin [Clonazepam]     Hallucinations   . Levaquin [Levofloxacin Hemihydrate] Hives    ROS As per HPI  PE: Blood pressure 117/89, pulse (!) 102, temperature 98.1 F (36.7 C), temperature source Oral, resp. rate 16, height  (1.6 m), weight 197 lb 12 oz (89.7 kg), SpO2 99 %. Gen: Alert, well appearing.  Patient is oriented to person, place, time, and situation. AFFECT: pleasant, lucid thought and  speech. CV: Regular rhythm, rate 100 no m/r/g.   LUNGS: CTA bilat, nonlabored resps, good aeration in all lung fields. EXT: no clubbing, cyanosis, or edema.    LABS:  No results found for: TSH Lab Results  Component Value Date   WBC 15.1 (H) 03/19/2016   HGB 16.0 (H) 03/19/2016   HCT 46.2 (H) 03/19/2016   MCV 89.7 03/19/2016   PLT 344 03/19/2016   Lab Results  Component Value Date   CREATININE 0.79 03/19/2016   BUN 11 03/19/2016   NA 137 03/19/2016   K 4.0 03/19/2016   CL 105 03/19/2016   CO2 23 03/19/2016   Lab Results  Component Value Date   ALT 11 (L) 02/23/2016   AST 14 (L) 02/23/2016   ALKPHOS 72 02/23/2016   BILITOT 0.6 02/23/2016   Lab Results  Component Value Date   CHOL 228 (H) 12/18/2011   Lab Results  Component Value Date   HDL 40 12/18/2011   Lab Results  Component Value Date   LDLCALC 150 (H) 12/18/2011   Lab Results  Component Value Date   TRIG 190 (H) 12/18/2011   Lab Results  Component Value Date   CHOLHDL 5.7 12/18/2011   Lab Results  Component Value Date   HGBA1C 5.7 (H) 12/18/2011   IMPRESSION AND PLAN:  Uncontrolled HTN: increase clonidine to 0.3mg  tid. This hopefully will also help lower her HR some. If we still need to work on bp control in near future then I'll add a beta blocker.  An After Visit Summary was printed and given to the patient.  FOLLOW UP: Return for 3-4 week f/u HTN.  Signed:  Santiago Bumpers, MD           05/08/2016

## 2016-05-08 NOTE — Addendum Note (Signed)
Addended by: Smitty Knudsen on: 05/08/2016 12:00 PM   Modules accepted: Orders

## 2016-05-09 ENCOUNTER — Telehealth: Payer: Self-pay

## 2016-05-09 NOTE — Telephone Encounter (Signed)
LM requesting call back to schedule AWV. 

## 2016-05-20 ENCOUNTER — Emergency Department (HOSPITAL_BASED_OUTPATIENT_CLINIC_OR_DEPARTMENT_OTHER)
Admission: EM | Admit: 2016-05-20 | Discharge: 2016-05-20 | Disposition: A | Discharge status: Home / Self Care | Payer: Medicare Other | Attending: Emergency Medicine | Admitting: Emergency Medicine

## 2016-05-20 ENCOUNTER — Emergency Department (HOSPITAL_BASED_OUTPATIENT_CLINIC_OR_DEPARTMENT_OTHER): Payer: Medicare Other

## 2016-05-20 ENCOUNTER — Encounter (HOSPITAL_BASED_OUTPATIENT_CLINIC_OR_DEPARTMENT_OTHER): Payer: Self-pay

## 2016-05-20 DIAGNOSIS — R1032 Left lower quadrant pain: Secondary | ICD-10-CM | POA: Diagnosis not present

## 2016-05-20 DIAGNOSIS — Z79899 Other long term (current) drug therapy: Secondary | ICD-10-CM | POA: Diagnosis not present

## 2016-05-20 DIAGNOSIS — F17210 Nicotine dependence, cigarettes, uncomplicated: Secondary | ICD-10-CM | POA: Insufficient documentation

## 2016-05-20 DIAGNOSIS — I10 Essential (primary) hypertension: Secondary | ICD-10-CM | POA: Insufficient documentation

## 2016-05-20 DIAGNOSIS — K5792 Diverticulitis of intestine, part unspecified, without perforation or abscess without bleeding: Secondary | ICD-10-CM | POA: Diagnosis not present

## 2016-05-20 DIAGNOSIS — K5732 Diverticulitis of large intestine without perforation or abscess without bleeding: Secondary | ICD-10-CM | POA: Insufficient documentation

## 2016-05-20 DIAGNOSIS — R109 Unspecified abdominal pain: Secondary | ICD-10-CM | POA: Diagnosis not present

## 2016-05-20 LAB — BASIC METABOLIC PANEL
ANION GAP: 8 (ref 5–15)
BUN: 16 mg/dL (ref 6–20)
CALCIUM: 9.1 mg/dL (ref 8.9–10.3)
CO2: 25 mmol/L (ref 22–32)
Chloride: 106 mmol/L (ref 101–111)
Creatinine, Ser: 1.03 mg/dL — ABNORMAL HIGH (ref 0.44–1.00)
Glucose, Bld: 114 mg/dL — ABNORMAL HIGH (ref 65–99)
Potassium: 4.1 mmol/L (ref 3.5–5.1)
Sodium: 139 mmol/L (ref 135–145)

## 2016-05-20 LAB — CBC WITH DIFFERENTIAL/PLATELET
BASOS ABS: 0.1 10*3/uL (ref 0.0–0.1)
BASOS PCT: 1 %
Eosinophils Absolute: 0.2 10*3/uL (ref 0.0–0.7)
Eosinophils Relative: 2 %
HEMATOCRIT: 41.2 % (ref 36.0–46.0)
HEMOGLOBIN: 14.5 g/dL (ref 12.0–15.0)
Lymphocytes Relative: 37 %
Lymphs Abs: 3.5 10*3/uL (ref 0.7–4.0)
MCH: 31.9 pg (ref 26.0–34.0)
MCHC: 35.2 g/dL (ref 30.0–36.0)
MCV: 90.5 fL (ref 78.0–100.0)
Monocytes Absolute: 0.8 10*3/uL (ref 0.1–1.0)
Monocytes Relative: 8 %
NEUTROS ABS: 5.1 10*3/uL (ref 1.7–7.7)
NEUTROS PCT: 52 %
Platelets: 267 10*3/uL (ref 150–400)
RBC: 4.55 MIL/uL (ref 3.87–5.11)
RDW: 13.9 % (ref 11.5–15.5)
WBC: 9.6 10*3/uL (ref 4.0–10.5)

## 2016-05-20 MED ORDER — HYDROMORPHONE HCL 1 MG/ML IJ SOLN
1.0000 mg | Freq: Once | INTRAMUSCULAR | Status: AC
  Administered 2016-05-20: 1 mg via INTRAVENOUS
  Filled 2016-05-20: qty 1

## 2016-05-20 MED ORDER — SODIUM CHLORIDE 0.9 % IV BOLUS (SEPSIS)
1000.0000 mL | Freq: Once | INTRAVENOUS | Status: AC
  Administered 2016-05-20: 1000 mL via INTRAVENOUS

## 2016-05-20 MED ORDER — METRONIDAZOLE 500 MG PO TABS: 500.0000 mg | ORAL_TABLET | Freq: Two times a day (BID) | ORAL | 0 refills | Status: DC

## 2016-05-20 MED ORDER — CIPROFLOXACIN HCL 500 MG PO TABS: 500.0000 mg | ORAL_TABLET | Freq: Two times a day (BID) | ORAL | 0 refills | Status: DC

## 2016-05-20 MED ORDER — ONDANSETRON HCL 4 MG/2ML IJ SOLN
4.0000 mg | Freq: Once | INTRAMUSCULAR | Status: AC
  Administered 2016-05-20: 4 mg via INTRAVENOUS
  Filled 2016-05-20: qty 2

## 2016-05-20 NOTE — ED Notes (Signed)
Patient transported to CT 

## 2016-05-20 NOTE — ED Notes (Addendum)
meds held pt states she will try and find a ride because she drove

## 2016-05-20 NOTE — Discharge Instructions (Signed)
Cipro and Flagyl as prescribed.  Return to the emergency department if your symptoms significantly worsen or change.

## 2016-05-20 NOTE — ED Triage Notes (Signed)
c/o abd pain, nausea x 2 days-states feels like diverticulitis-NAD-steady gait

## 2016-05-20 NOTE — ED Provider Notes (Signed)
[facility] Provider Note   CSN: [id number] Arrival date & time: 05/20/16  1347     History   Chief Complaint Chief Complaint  Patient presents with  . Abdominal Pain    HPI Dana Morrow is a 51 y.o. female.  Patient is a 51 year old female with past medical history of diverticulosis who presents with left lower quadrant pain. She describes this as sharp in nature and similar to her prior bouts of diverticulitis. She denies any fevers or chills. She denies bloody stool or urinary complaints.      Past Medical History:  Diagnosis Date  . Abdominal pain, chronic, left lower quadrant    a. s/p Laproscopy in 2004 w/o finding of source of discomfort.  . Anxiety and depression 2013   Lots of stress  . Chest pain    a. reportedly nl MV and echo 10/2011 - [place]  . Cholelithiasis    a. s/p cholecystectomy 10/2011  . Colon polyps    a. s/p colonoscopy and polypectomy in 2003  . Diverticulosis    with ? of 'itis  . Family history of colon cancer    mother  . GERD (gastroesophageal reflux disease)   . Hiatal hernia    a. small by EGD 2006.  [name] History of amaurosis fugax 01/2011   L eye; saw neuro at [facility] (Dr. Johnell Comings).  Lab w/u and carotid doppler nl per old records.  . Hyperlipidemia    LDL 191 in 2013 per old records  . Hypertension   . Migraine   . RSD (reflex sympathetic dystrophy)    a. L shoulder--pain mgmt with Dr. Marlynn Perking at [facility] in [place]  . Tobacco abuse    a. 50 pack year hx.  . Visual disturbance    a. 12/2011 Left eye visual changes->w/u for TIA->Carotid U/S:  No ICA stenosis; Head CT: No acute abnl; MRI/MRA: mild RPCA stenosis; Echo: EF 55-60%, Triv AI, No PFO.    Patient Active Problem List   Diagnosis Date Noted  . Trigeminal neuralgia of left side of face 05/10/2015  . Chest pressure 05/10/2015  . Acute bronchitis 03/27/2015    Past Surgical History:  Procedure Laterality  Date  . CESAREAN SECTION  1990  . COLONOSCOPY  10/2001   Dr. Ewing Schlein; diverticulosis, int/ext hem, one small polyp removed.  . ENDOMETRIAL ABLATION  2011  . ESOPHAGOGASTRODUODENOSCOPY  02/2004   Dr. Ewing Schlein; normal  . LAPAROSCOPIC CHOLECYSTECTOMY  10/19/11  . LAPAROSCOPY  05/2002   Dr. Harmon Dun cause for LLQ abd pain found  . SHOULDER SURGERY Left 1997 &98  . UMBILICAL HERNIA REPAIR  2014   with mesh    OB History    No data available       Home Medications    Prior to Admission medications   Medication Sig Start Date End Date Taking? Authorizing Provider  albuterol (VENTOLIN HFA) 108 (90 Base) MCG/ACT inhaler Inhale 1-2 puffs into the lungs every 6 (six) hours as needed for wheezing or shortness of breath. 03/14/16   Sharlene Dory, DO  cloNIDine (CATAPRES) 0.3 MG tablet Take 1 tablet (0.3 mg total) by mouth 3 (three) times daily. 05/08/16   McGowen, Maryjean Morn, MD  HYDROmorphone (DILAUDID) 2 MG tablet Take by mouth 4 (four) times daily as needed.     [provider]  ibuprofen (ADVIL,MOTRIN) 600 MG tablet Take 1 tablet (600 mg total) by mouth 3 (three) times daily. 11/04/15   Street, [place],  PA-C    Family History Family History  Problem Relation Age of Onset  . Dementia Father   . Parkinson's disease Father        d. 2013  . Hypertension Father   . Hyperlipidemia Father   . Heart disease Father   . Heart attack Mother        died @ 6367  . Colon cancer Mother   . Hyperlipidemia Mother   . Heart disease Mother   . Hypertension Mother   . Esophageal cancer Brother   . Diabetes Neg Hx     Social History Social History  Substance Use Topics  . Smoking status: Current Every Day Smoker    Packs/day: 0.50    Years: 30.00    Types: Cigarettes  . Smokeless tobacco: Never Used  . Alcohol use No     Allergies   Flagyl [metronidazole]; Elavil [amitriptyline]; Klonopin [clonazepam]; and Levaquin [levofloxacin hemihydrate]   Review of  Systems Review of Systems  All other systems reviewed and are negative.    Physical Exam Updated Vital Signs BP (!) 172/111   Pulse (!) 112   Temp 98.5 F (36.9 C) (Oral)   Resp 20   Ht 5\' 3"  (1.6 m)   Wt 198 lb (89.8 kg)   SpO2 99%   BMI 35.07 kg/m   Physical Exam  Constitutional: She is oriented to person, place, and time. She appears well-developed and well-nourished. No distress.  HENT:  Head: Normocephalic and atraumatic.  Neck: Normal range of motion. Neck supple.  Cardiovascular: Normal rate and regular rhythm.  Exam reveals no gallop and no friction rub.   No murmur heard. Pulmonary/Chest: Effort normal and breath sounds normal. No respiratory distress. She has no wheezes.  Abdominal: Soft. Bowel sounds are normal. She exhibits no distension. There is tenderness. There is no rebound and no guarding.  There is tenderness in the left lower quadrant.  Musculoskeletal: Normal range of motion.  Neurological: She is alert and oriented to person, place, and time.  Skin: Skin is warm and dry. She is not diaphoretic.  Nursing note and vitals reviewed.    ED Treatments / Results  Labs (all labs ordered are listed, but only abnormal results are displayed) Labs Reviewed  BASIC METABOLIC PANEL - Abnormal; Notable for the following:       Result Value   Glucose, Bld 114 (*)    Creatinine, Ser 1.03 (*)    All other components within normal limits  CBC WITH DIFFERENTIAL/PLATELET  URINALYSIS, ROUTINE W REFLEX MICROSCOPIC    EKG  EKG Interpretation None       Radiology Ct Renal Stone Study  Result Date: 05/20/2016 CLINICAL DATA:  Acute left flank pain. EXAM: CT ABDOMEN AND PELVIS WITHOUT CONTRAST TECHNIQUE: Multidetector CT imaging of the abdomen and pelvis was performed following the standard protocol without IV contrast. COMPARISON:  CT scan of November 24, 2015. FINDINGS: Lower chest: No acute abnormality. Hepatobiliary: No focal liver abnormality is seen. Status  post cholecystectomy. No biliary dilatation. Pancreas: Unremarkable. No pancreatic ductal dilatation or surrounding inflammatory changes. Spleen: Normal in size without focal abnormality. Adrenals/Urinary Tract: Adrenal glands appear normal. No hydronephrosis or renal obstruction is noted. No renal or ureteral calculi are noted. Urinary bladder is nondistended. Stomach/Bowel: Stomach is within normal limits. Appendix appears normal. No evidence of bowel wall thickening, distention, or inflammatory changes. Sigmoid diverticulosis is noted. Vascular/Lymphatic: Aortic atherosclerosis. No enlarged abdominal or pelvic lymph nodes. Reproductive: Uterus and bilateral adnexa are unremarkable.  Other: No abdominal wall hernia or abnormality. No abdominopelvic ascites. Musculoskeletal: No acute or significant osseous findings. IMPRESSION: Aortic atherosclerosis. Sigmoid diverticulosis without inflammation. No acute abnormality seen in the abdomen or pelvis. Electronically Signed   By: Lupita Raider, M.D.   On: 05/20/2016 14:32    Procedures Procedures (including critical care time)  Medications Ordered in ED Medications  HYDROmorphone (DILAUDID) injection 1 mg (1 mg Intravenous Given 05/20/16 1446)  ondansetron (ZOFRAN) injection 4 mg (4 mg Intravenous Given 05/20/16 1438)  sodium chloride 0.9 % bolus 1,000 mL (1,000 mLs Intravenous New Bag/Given 05/20/16 1417)     Initial Impression / Assessment and Plan / ED Course  I have reviewed the triage vital signs and the nursing notes.  Pertinent labs & imaging results that were available during my care of the patient were reviewed by me and considered in my medical decision making (see chart for details).  Workup does not show diverticulitis, however the patient is positive that is what she has. There is no evidence of an alternate diagnosis on her CT scan. She will be discharged with Cipro and Flagyl as this has helped her in the past. She is to follow-up with her  primary Dr. if not improving.  Final Clinical Impressions(s) / ED Diagnoses   Final diagnoses:  None    New Prescriptions New Prescriptions   No medications on file     Geoffery Lyons, MD 05/20/16 1502

## 2016-05-27 DIAGNOSIS — G8929 Other chronic pain: Secondary | ICD-10-CM | POA: Diagnosis not present

## 2016-05-27 DIAGNOSIS — G90512 Complex regional pain syndrome I of left upper limb: Secondary | ICD-10-CM | POA: Diagnosis not present

## 2016-05-29 ENCOUNTER — Ambulatory Visit: Payer: Medicare Other | Admitting: Family Medicine

## 2016-05-29 NOTE — Progress Notes (Deleted)
OFFICE VISIT  05/29/2016   CC: No chief complaint on file.  HPI:    Patient is a 51 y.o. Caucasian female who presents for 3 week f/u uncontrolled HTN. Last visit I increased her clonidine to 0.3mg  tid.  Since I last saw her she went to ED 05/20/16 for LLQ abd pain, which she insisted was a bout of her diverticulitis.  CT scan showed no evidence of this dx, though. Since there was no sign of alternative dx, she was treated with cipro and flagyl b/c this has historically helped her in this scenario.  Past Medical History:  Diagnosis Date  . Abdominal pain, chronic, left lower quadrant    a. s/p Laproscopy in 2004 w/o finding of source of discomfort.  . Anxiety and depression 2013   Lots of stress  . Chest pain    a. reportedly nl MV and echo 10/2011 - [place]  . Cholelithiasis    a. s/p cholecystectomy 10/2011  . Colon polyps    a. s/p colonoscopy and polypectomy in 2003  . Diverticulosis    with ? of 'itis  . Family history of colon cancer    mother  . GERD (gastroesophageal reflux disease)   . Hiatal hernia    a. small by EGD 2006.  [name]. History of amaurosis fugax 01/2011   L eye; saw neuro at [facility] (Dr. Johnell ComingsMieden).  Lab w/u and carotid doppler nl per old records.  . Hyperlipidemia    LDL 191 in 2013 per old records  . Hypertension   . Migraine   . RSD (reflex sympathetic dystrophy)    a. L shoulder--pain mgmt with Dr. Marlynn PerkingMalloy at [facility] [place]  . Tobacco abuse    a. 50 pack year hx.  . Visual disturbance    a. 12/2011 Left eye visual changes->w/u for TIA->Carotid U/S:  No ICA stenosis; Head CT: No acute abnl; MRI/MRA: mild RPCA stenosis; Echo: EF 55-60%, Triv AI, No PFO.    Past Surgical History:  Procedure Laterality Date  . CESAREAN SECTION  1990  . COLONOSCOPY  10/2001   Dr. Ewing SchleinMagod; diverticulosis, int/ext hem, one small polyp removed.  . ENDOMETRIAL ABLATION  2011  . ESOPHAGOGASTRODUODENOSCOPY  02/2004   Dr. Ewing SchleinMagod; normal  . LAPAROSCOPIC CHOLECYSTECTOMY  10/19/11  . LAPAROSCOPY  05/2002   Dr. Harmon Dunimothy Davis--no cause for LLQ abd pain found  . SHOULDER SURGERY Left 1997 &98  . UMBILICAL HERNIA REPAIR  2014   with mesh    Outpatient Medications Prior to Visit  Medication Sig Dispense Refill  . albuterol (VENTOLIN HFA) 108 (90 Base) MCG/ACT inhaler Inhale 1-2 puffs into the lungs every 6 (six) hours as needed for wheezing or shortness of breath. 1 Inhaler 1  . ciprofloxacin (CIPRO) 500 MG tablet Take 1 tablet (500 mg total) by mouth 2 (two) times daily. One po bid x 7 days 21 tablet 0  . cloNIDine (CATAPRES) 0.3 MG tablet Take 1 tablet (0.3 mg total) by mouth 3 (three) times daily. 90 tablet 1  . HYDROmorphone (DILAUDID) 2 MG tablet Take by mouth 4 (four) times daily as needed.     [name]. ibuprofen (ADVIL,MOTRIN) 600 MG tablet Take 1 tablet (600 mg total) by mouth 3 (three) times daily. 30 tablet 0  . metroNIDAZOLE (FLAGYL) 500 MG tablet Take 1 tablet (500 mg total) by mouth 2 (two) times daily. One po tid x 7 days 14 tablet 0   No facility-administered medications prior to visit.  Allergies  Allergen Reactions  . Flagyl [Metronidazole] Nausea And Vomiting    Oral form only, IV pt can tolerate  . Elavil [Amitriptyline]     hallucinations  . Klonopin [Clonazepam]     Hallucinations   . Levaquin [Levofloxacin Hemihydrate] Hives    ROS As per HPI  PE: There were no vitals taken for this visit. ***  LABS:    Chemistry      Component Value Date/Time   NA 139 05/20/2016 1404   K 4.1 05/20/2016 1404   CL 106 05/20/2016 1404   CO2 25 05/20/2016 1404   BUN 16 05/20/2016 1404   CREATININE 1.03 (H) 05/20/2016 1404      Component Value Date/Time   CALCIUM 9.1 05/20/2016 1404   ALKPHOS 72 02/23/2016 1545   AST 14 (L) 02/23/2016 1545   ALT 11 (L) 02/23/2016 1545   BILITOT 0.6 02/23/2016 1545       IMPRESSION AND PLAN:  No problem-specific Assessment & Plan notes found for  this encounter.  If bp still up and HR ok--add beta blocker.  An After Visit Summary was printed and given to the patient.  FOLLOW UP: No Follow-up on file.  Signed:  Santiago Bumpers, MD           05/29/2016

## 2016-06-13 DIAGNOSIS — M47894 Other spondylosis, thoracic region: Secondary | ICD-10-CM | POA: Diagnosis not present

## 2016-06-13 DIAGNOSIS — Z79899 Other long term (current) drug therapy: Secondary | ICD-10-CM | POA: Diagnosis not present

## 2016-06-13 DIAGNOSIS — R109 Unspecified abdominal pain: Secondary | ICD-10-CM | POA: Diagnosis not present

## 2016-06-13 DIAGNOSIS — K5900 Constipation, unspecified: Secondary | ICD-10-CM | POA: Diagnosis not present

## 2016-06-13 DIAGNOSIS — M7989 Other specified soft tissue disorders: Secondary | ICD-10-CM | POA: Diagnosis not present

## 2016-06-13 DIAGNOSIS — M47896 Other spondylosis, lumbar region: Secondary | ICD-10-CM | POA: Diagnosis not present

## 2016-06-13 DIAGNOSIS — Z888 Allergy status to other drugs, medicaments and biological substances status: Secondary | ICD-10-CM | POA: Diagnosis not present

## 2016-06-13 DIAGNOSIS — R0789 Other chest pain: Secondary | ICD-10-CM | POA: Diagnosis not present

## 2016-06-13 DIAGNOSIS — M5442 Lumbago with sciatica, left side: Secondary | ICD-10-CM | POA: Diagnosis not present

## 2016-06-13 DIAGNOSIS — F419 Anxiety disorder, unspecified: Secondary | ICD-10-CM | POA: Diagnosis not present

## 2016-06-13 DIAGNOSIS — Z720 Tobacco use: Secondary | ICD-10-CM | POA: Diagnosis not present

## 2016-06-13 DIAGNOSIS — M545 Low back pain: Secondary | ICD-10-CM | POA: Diagnosis not present

## 2016-06-13 DIAGNOSIS — M79605 Pain in left leg: Secondary | ICD-10-CM | POA: Diagnosis not present

## 2016-06-13 DIAGNOSIS — I10 Essential (primary) hypertension: Secondary | ICD-10-CM | POA: Diagnosis not present

## 2016-06-13 DIAGNOSIS — E785 Hyperlipidemia, unspecified: Secondary | ICD-10-CM | POA: Diagnosis not present

## 2016-06-13 DIAGNOSIS — M5440 Lumbago with sciatica, unspecified side: Secondary | ICD-10-CM | POA: Diagnosis not present

## 2016-06-13 DIAGNOSIS — F17210 Nicotine dependence, cigarettes, uncomplicated: Secondary | ICD-10-CM | POA: Diagnosis not present

## 2016-06-24 DIAGNOSIS — M47816 Spondylosis without myelopathy or radiculopathy, lumbar region: Secondary | ICD-10-CM | POA: Diagnosis not present

## 2016-06-24 DIAGNOSIS — F17200 Nicotine dependence, unspecified, uncomplicated: Secondary | ICD-10-CM | POA: Diagnosis not present

## 2016-06-24 DIAGNOSIS — G8929 Other chronic pain: Secondary | ICD-10-CM | POA: Diagnosis not present

## 2016-06-24 DIAGNOSIS — G90519 Complex regional pain syndrome I of unspecified upper limb: Secondary | ICD-10-CM | POA: Diagnosis not present

## 2016-07-11 DIAGNOSIS — R079 Chest pain, unspecified: Secondary | ICD-10-CM | POA: Diagnosis not present

## 2016-07-11 DIAGNOSIS — Z79899 Other long term (current) drug therapy: Secondary | ICD-10-CM | POA: Diagnosis not present

## 2016-07-11 DIAGNOSIS — K5730 Diverticulosis of large intestine without perforation or abscess without bleeding: Secondary | ICD-10-CM | POA: Diagnosis not present

## 2016-07-11 DIAGNOSIS — R918 Other nonspecific abnormal finding of lung field: Secondary | ICD-10-CM | POA: Diagnosis not present

## 2016-07-11 DIAGNOSIS — R0789 Other chest pain: Secondary | ICD-10-CM | POA: Diagnosis not present

## 2016-07-11 DIAGNOSIS — R109 Unspecified abdominal pain: Secondary | ICD-10-CM | POA: Diagnosis not present

## 2016-07-11 DIAGNOSIS — F17200 Nicotine dependence, unspecified, uncomplicated: Secondary | ICD-10-CM | POA: Diagnosis not present

## 2016-07-11 DIAGNOSIS — R112 Nausea with vomiting, unspecified: Secondary | ICD-10-CM | POA: Diagnosis not present

## 2016-07-11 DIAGNOSIS — R0602 Shortness of breath: Secondary | ICD-10-CM | POA: Diagnosis not present

## 2016-07-11 DIAGNOSIS — K449 Diaphragmatic hernia without obstruction or gangrene: Secondary | ICD-10-CM | POA: Diagnosis not present

## 2016-07-11 DIAGNOSIS — G9050 Complex regional pain syndrome I, unspecified: Secondary | ICD-10-CM | POA: Diagnosis not present

## 2016-07-11 DIAGNOSIS — K5900 Constipation, unspecified: Secondary | ICD-10-CM | POA: Diagnosis not present

## 2016-07-11 DIAGNOSIS — H1131 Conjunctival hemorrhage, right eye: Secondary | ICD-10-CM | POA: Diagnosis not present

## 2016-07-11 DIAGNOSIS — Z780 Asymptomatic menopausal state: Secondary | ICD-10-CM | POA: Diagnosis not present

## 2016-07-11 DIAGNOSIS — K5732 Diverticulitis of large intestine without perforation or abscess without bleeding: Secondary | ICD-10-CM | POA: Diagnosis not present

## 2016-07-11 DIAGNOSIS — R1032 Left lower quadrant pain: Secondary | ICD-10-CM | POA: Diagnosis not present

## 2016-07-11 DIAGNOSIS — I10 Essential (primary) hypertension: Secondary | ICD-10-CM | POA: Diagnosis not present

## 2016-07-12 DIAGNOSIS — R1032 Left lower quadrant pain: Secondary | ICD-10-CM | POA: Diagnosis not present

## 2016-07-12 DIAGNOSIS — I10 Essential (primary) hypertension: Secondary | ICD-10-CM | POA: Diagnosis not present

## 2016-07-12 DIAGNOSIS — I083 Combined rheumatic disorders of mitral, aortic and tricuspid valves: Secondary | ICD-10-CM | POA: Diagnosis not present

## 2016-07-12 DIAGNOSIS — R0602 Shortness of breath: Secondary | ICD-10-CM | POA: Diagnosis not present

## 2016-07-12 DIAGNOSIS — K5730 Diverticulosis of large intestine without perforation or abscess without bleeding: Secondary | ICD-10-CM | POA: Diagnosis not present

## 2016-07-12 DIAGNOSIS — R079 Chest pain, unspecified: Secondary | ICD-10-CM | POA: Diagnosis not present

## 2016-07-13 DIAGNOSIS — G9050 Complex regional pain syndrome I, unspecified: Secondary | ICD-10-CM | POA: Diagnosis present

## 2016-07-13 DIAGNOSIS — Z79899 Other long term (current) drug therapy: Secondary | ICD-10-CM | POA: Diagnosis not present

## 2016-07-13 DIAGNOSIS — R109 Unspecified abdominal pain: Secondary | ICD-10-CM | POA: Diagnosis not present

## 2016-07-13 DIAGNOSIS — K5732 Diverticulitis of large intestine without perforation or abscess without bleeding: Secondary | ICD-10-CM | POA: Diagnosis present

## 2016-07-13 DIAGNOSIS — R1032 Left lower quadrant pain: Secondary | ICD-10-CM | POA: Diagnosis not present

## 2016-07-13 DIAGNOSIS — K219 Gastro-esophageal reflux disease without esophagitis: Secondary | ICD-10-CM | POA: Diagnosis present

## 2016-07-13 DIAGNOSIS — H1131 Conjunctival hemorrhage, right eye: Secondary | ICD-10-CM | POA: Diagnosis present

## 2016-07-13 DIAGNOSIS — I10 Essential (primary) hypertension: Secondary | ICD-10-CM | POA: Diagnosis present

## 2016-07-13 DIAGNOSIS — F17200 Nicotine dependence, unspecified, uncomplicated: Secondary | ICD-10-CM | POA: Diagnosis present

## 2016-07-13 DIAGNOSIS — K5730 Diverticulosis of large intestine without perforation or abscess without bleeding: Secondary | ICD-10-CM | POA: Diagnosis not present

## 2016-07-13 DIAGNOSIS — R079 Chest pain, unspecified: Secondary | ICD-10-CM | POA: Diagnosis not present

## 2016-07-13 DIAGNOSIS — K5900 Constipation, unspecified: Secondary | ICD-10-CM | POA: Diagnosis present

## 2016-07-18 DIAGNOSIS — G500 Trigeminal neuralgia: Secondary | ICD-10-CM | POA: Diagnosis not present

## 2016-07-18 DIAGNOSIS — M47816 Spondylosis without myelopathy or radiculopathy, lumbar region: Secondary | ICD-10-CM | POA: Diagnosis not present

## 2016-07-18 DIAGNOSIS — G8929 Other chronic pain: Secondary | ICD-10-CM | POA: Diagnosis not present

## 2016-07-29 ENCOUNTER — Other Ambulatory Visit: Payer: Self-pay | Admitting: Family Medicine

## 2016-07-30 NOTE — Telephone Encounter (Signed)
[facility].  RF request for clonidine LOV: 05/08/16 Next ov: None Last written: 05/08/16 #90 w/ 1RF  Will send Rx for #90 w/ 0Rf. Pt was advised at last office visit for f/u for BP in 2-3 weeks. Needs office visit for f/u HTN for more refills.

## 2016-07-30 NOTE — Telephone Encounter (Signed)
Left message for pt to call back  °

## 2016-07-31 NOTE — Telephone Encounter (Signed)
Pt advised and voiced understanding. Apt made for 08/01/16 at 8:30am.

## 2016-08-01 ENCOUNTER — Encounter: Payer: Self-pay | Admitting: Family Medicine

## 2016-08-01 ENCOUNTER — Ambulatory Visit (INDEPENDENT_AMBULATORY_CARE_PROVIDER_SITE_OTHER): Payer: Medicare Other | Admitting: Family Medicine

## 2016-08-01 VITALS — BP 123/77 | HR 93 | Temp 98.5°F | Resp 16 | Ht 63.0 in | Wt 192.0 lb

## 2016-08-01 DIAGNOSIS — G90512 Complex regional pain syndrome I of left upper limb: Secondary | ICD-10-CM | POA: Diagnosis not present

## 2016-08-01 DIAGNOSIS — Z1231 Encounter for screening mammogram for malignant neoplasm of breast: Secondary | ICD-10-CM | POA: Diagnosis not present

## 2016-08-01 DIAGNOSIS — I10 Essential (primary) hypertension: Secondary | ICD-10-CM

## 2016-08-01 DIAGNOSIS — G894 Chronic pain syndrome: Secondary | ICD-10-CM | POA: Diagnosis not present

## 2016-08-01 DIAGNOSIS — Z124 Encounter for screening for malignant neoplasm of cervix: Secondary | ICD-10-CM | POA: Diagnosis not present

## 2016-08-01 DIAGNOSIS — Z800 Family history of malignant neoplasm of digestive organs: Secondary | ICD-10-CM | POA: Diagnosis not present

## 2016-08-01 DIAGNOSIS — Z1211 Encounter for screening for malignant neoplasm of colon: Secondary | ICD-10-CM | POA: Diagnosis not present

## 2016-08-01 DIAGNOSIS — Z1239 Encounter for other screening for malignant neoplasm of breast: Secondary | ICD-10-CM

## 2016-08-01 NOTE — Progress Notes (Signed)
OFFICE VISIT  08/01/2016   CC:  Chief Complaint  Patient presents with  . Follow-up    HTN   HPI:    Patient is a [age] y.o. Caucasian female who presents for 3 mo f/u uncontrolled HTN. Last f/u visit I increased her clonidine to 0.3mg  tid.  Home bp monitoring: consistently 120s/70s.  She is satisfied with this. She is still smoking cigs and is cutting back (1 pack per week). Walks every day 30 min for exercise.  In the interim she has had a ED visit for abdominal pain, diverticulitis was admission dx but her CT was neg for diverticulitis changes.  Then was [facility].  Her only symptom was severe fatigue.  She feels back to normal now.  She has chronic pain from reflex sympathetic dystrophy (L shoulder).  Formerly went to [facility] "where they only rx pills".  Pt states she also needs "procedures" (Stellate ganglion blocks), and pt was told that she should switch to Dr. Laurian Brim'toole. She request mgmt to new pain mgmt center: Dr. Laurian [facility]'Toole at [facility].  Also requests referral to GYN for cervical and breast ca screening.  Her current GYN is too far way. Last pap was 2 yrs ago.  Also wants referral to GI for colon ca screening.  Last colonoscopy was > 10 yrs ago: she has FH of colon cancer ([facility]).   Past Medical History:  Diagnosis Date  . Abdominal pain, chronic, left lower quadrant    a. s/p Laproscopy in 2004 w/o finding of source of discomfort.  . Anxiety and depression 2013   Lots of stress  . Chest pain    a. reportedly nl MV and echo 10/2011 - [place]  . Cholelithiasis    a. s/p cholecystectomy 10/2011  . Colon polyps    a. s/p colonoscopy and polypectomy in 2003  . Diverticulosis    with ? of 'itis  . Family history of colon cancer    mother  . GERD (gastroesophageal reflux disease)   . Hiatal hernia    a. small by EGD 2006.  [name]. History of amaurosis fugax 01/2011   L eye; saw neuro at  [facility] (Dr. Johnell ComingsMieden).  Lab w/u and carotid doppler nl per old records.  . Hyperlipidemia    LDL 191 in 2013 per old records  . Hypertension   . Migraine   . RSD (reflex sympathetic dystrophy)    a. L shoulder--pain mgmt with Dr. Marlynn PerkingMalloy at [facility] [place]  . Tobacco abuse    a. 50 pack year hx.  . Visual disturbance    a. 12/2011 Left eye visual changes->w/u for TIA->Carotid U/S:  No ICA stenosis; Head CT: No acute abnl; MRI/MRA: mild RPCA stenosis; Echo: EF 55-60%, Triv AI, No PFO.    Past Surgical History:  Procedure Laterality Date  . CESAREAN SECTION  1990  . COLONOSCOPY  10/2001   Dr. Ewing SchleinMagod; diverticulosis, int/ext hem, one small polyp removed.  . ENDOMETRIAL ABLATION  2011  . ESOPHAGOGASTRODUODENOSCOPY  02/2004   Dr. Ewing SchleinMagod; normal  . LAPAROSCOPIC CHOLECYSTECTOMY  10/19/11  . LAPAROSCOPY  05/2002   Dr. Harmon Dunimothy Davis--no cause for LLQ abd pain found  . SHOULDER SURGERY Left 1997 &98  . UMBILICAL HERNIA REPAIR  2014   with mesh    Outpatient Medications Prior to Visit  Medication Sig Dispense Refill  . albuterol (VENTOLIN HFA) 108 (90 Base) MCG/ACT inhaler Inhale 1-2 puffs into  the lungs every 6 (six) hours as needed for wheezing or shortness of breath. 1 Inhaler 1  . cloNIDine (CATAPRES) 0.3 MG tablet TAKE ONE TABLET BY MOUTH 3 TIMES DAILY 90 tablet 0  . HYDROmorphone (DILAUDID) 2 MG tablet Take by mouth 4 (four) times daily as needed.     . ciprofloxacin (CIPRO) 500 MG tablet Take 1 tablet (500 mg total) by mouth 2 (two) times daily. One po bid x 7 days (Patient not taking: Reported on 08/01/2016) 21 tablet 0  . ibuprofen (ADVIL,MOTRIN) 600 MG tablet Take 1 tablet (600 mg total) by mouth 3 (three) times daily. (Patient not taking: Reported on 08/01/2016) 30 tablet 0  . metroNIDAZOLE (FLAGYL) 500 MG tablet Take 1 tablet (500 mg total) by mouth 2 (two) times daily. One po tid x 7 days (Patient not taking: Reported on 08/01/2016)  14 tablet 0   No facility-administered medications prior to visit.     Allergies  Allergen Reactions  . Flagyl [Metronidazole] Nausea And Vomiting    Oral form only, IV pt can tolerate  . Elavil [Amitriptyline]     hallucinations  . Klonopin [Clonazepam]     Hallucinations   . Levaquin [Levofloxacin Hemihydrate] Hives    ROS As per HPI  PE: Blood pressure 123/77, pulse 93, temperature 98.5 F (36.9 C), temperature source Oral, resp. rate 16, height 5\' 3"  (1.6 m), weight 192 lb (87.1 kg), SpO2 99 %. Gen: Alert, well appearing.  Patient is oriented to person, place, time, and situation. AFFECT: pleasant, lucid thought and speech. CV: RRR, no m/r/g.   LUNGS: CTA bilat, nonlabored resps, good aeration in all lung fields. EXT: no clubbing, cyanosis, or edema.    LABS:    Chemistry      Component Value Date/Time   NA 139 05/20/2016 1404   K 4.1 05/20/2016 1404   CL 106 05/20/2016 1404   CO2 25 05/20/2016 1404   BUN 16 05/20/2016 1404   CREATININE 1.03 (H) 05/20/2016 1404      Component Value Date/Time   CALCIUM 9.1 05/20/2016 1404   ALKPHOS 72 02/23/2016 1545   AST 14 (L) 02/23/2016 1545   ALT 11 (L) 02/23/2016 1545   BILITOT 0.6 02/23/2016 1545      IMPRESSION AND PLAN:  1) HTN: The current medical regimen is effective;  continue present plan and medications.  2) Chronic pain syndrome:  Reflex sympathetic dystrophy of L arm/shoulder--needs referral to Dr. Laurian Brim'toole who is an interventional pain mgmt specialist b/c she gets good benefit from stellate ganglion blocks and this is a procedure her current pain clinic no longer does. Referral ordered today.  3) Colon ca screening, with FH of colon ca in mother: referred to  GI today.  4) Cervical ca and breast ca screening: pt requested referral to local GYN office today b/c her current GYN provider was too far away (beyond [place]  Ordered referral today.  An After Visit Summary was printed and given  to the patient.  FOLLOW UP: Return in about 6 months (around 02/01/2017) for annual CPE (fasting).  Signed:  Santiago BumpersPhil McGowen, MD           08/01/2016

## 2016-08-01 NOTE — Addendum Note (Signed)
Addended by: Smitty KnudsenSUTHERLAND, HEATHER K on: 08/01/2016 03:10 PM   Modules accepted: Orders

## 2016-08-02 ENCOUNTER — Encounter: Payer: Self-pay | Admitting: Gastroenterology

## 2016-08-08 ENCOUNTER — Observation Stay (HOSPITAL_COMMUNITY)
Admission: EM | Admit: 2016-08-08 | Discharge: 2016-08-09 | Disposition: A | Discharge status: Home / Self Care | Payer: Medicare Other | Attending: Internal Medicine | Admitting: Internal Medicine

## 2016-08-08 ENCOUNTER — Emergency Department (HOSPITAL_COMMUNITY): Payer: Medicare Other

## 2016-08-08 ENCOUNTER — Emergency Department (HOSPITAL_BASED_OUTPATIENT_CLINIC_OR_DEPARTMENT_OTHER): Admit: 2016-08-08 | Discharge: 2016-08-08 | Disposition: A | Discharge status: Home / Self Care | Payer: Medicare Other

## 2016-08-08 ENCOUNTER — Encounter (HOSPITAL_COMMUNITY): Payer: Self-pay | Admitting: Emergency Medicine

## 2016-08-08 DIAGNOSIS — G894 Chronic pain syndrome: Secondary | ICD-10-CM | POA: Diagnosis present

## 2016-08-08 DIAGNOSIS — F17210 Nicotine dependence, cigarettes, uncomplicated: Secondary | ICD-10-CM | POA: Diagnosis not present

## 2016-08-08 DIAGNOSIS — I82491 Acute embolism and thrombosis of other specified deep vein of right lower extremity: Secondary | ICD-10-CM | POA: Diagnosis not present

## 2016-08-08 DIAGNOSIS — I10 Essential (primary) hypertension: Secondary | ICD-10-CM | POA: Diagnosis not present

## 2016-08-08 DIAGNOSIS — I2699 Other pulmonary embolism without acute cor pulmonale: Secondary | ICD-10-CM | POA: Diagnosis not present

## 2016-08-08 DIAGNOSIS — R0602 Shortness of breath: Secondary | ICD-10-CM

## 2016-08-08 DIAGNOSIS — I82431 Acute embolism and thrombosis of right popliteal vein: Secondary | ICD-10-CM | POA: Diagnosis present

## 2016-08-08 DIAGNOSIS — R609 Edema, unspecified: Secondary | ICD-10-CM

## 2016-08-08 DIAGNOSIS — Z8701 Personal history of pneumonia (recurrent): Secondary | ICD-10-CM | POA: Diagnosis not present

## 2016-08-08 DIAGNOSIS — Z79891 Long term (current) use of opiate analgesic: Secondary | ICD-10-CM | POA: Diagnosis not present

## 2016-08-08 DIAGNOSIS — Z79899 Other long term (current) drug therapy: Secondary | ICD-10-CM | POA: Diagnosis not present

## 2016-08-08 DIAGNOSIS — G90512 Complex regional pain syndrome I of left upper limb: Secondary | ICD-10-CM | POA: Diagnosis not present

## 2016-08-08 DIAGNOSIS — R079 Chest pain, unspecified: Secondary | ICD-10-CM | POA: Diagnosis not present

## 2016-08-08 DIAGNOSIS — G90519 Complex regional pain syndrome I of unspecified upper limb: Secondary | ICD-10-CM | POA: Diagnosis present

## 2016-08-08 HISTORY — DX: Other pulmonary embolism without acute cor pulmonale: I26.99

## 2016-08-08 LAB — BASIC METABOLIC PANEL
Anion gap: 4 — ABNORMAL LOW (ref 5–15)
BUN: 12 mg/dL (ref 6–20)
CHLORIDE: 104 mmol/L (ref 101–111)
CO2: 28 mmol/L (ref 22–32)
Calcium: 8.8 mg/dL — ABNORMAL LOW (ref 8.9–10.3)
Creatinine, Ser: 0.85 mg/dL (ref 0.44–1.00)
GFR calc Af Amer: >60 mL/min (ref >60)
GFR calc non Af Amer: >60 mL/min (ref >60)
GLUCOSE: 110 mg/dL — AB (ref 65–99)
POTASSIUM: 4.8 mmol/L (ref 3.5–5.1)
Sodium: 136 mmol/L (ref 135–145)

## 2016-08-08 LAB — I-STAT TROPONIN, ED
Troponin i, poc: 0.02 ng/mL (ref 0.00–0.08)
Troponin i, poc: 0.03 ng/mL (ref 0.00–0.08)

## 2016-08-08 LAB — CBC
HCT: 38.9 % (ref 36.0–46.0)
Hemoglobin: 13.7 g/dL (ref 12.0–15.0)
MCH: 31.4 pg (ref 26.0–34.0)
MCHC: 35.2 g/dL (ref 30.0–36.0)
MCV: 89.2 fL (ref 78.0–100.0)
PLATELETS: 203 10*3/uL (ref 150–400)
RBC: 4.36 MIL/uL (ref 3.87–5.11)
RDW: 14.1 % (ref 11.5–15.5)
WBC: 10.2 10*3/uL (ref 4.0–10.5)

## 2016-08-08 LAB — CBG MONITORING, ED: Glucose-Capillary: 119 mg/dL — ABNORMAL HIGH (ref 65–99)

## 2016-08-08 MED ORDER — HYDROMORPHONE HCL-NACL 0.5-0.9 MG/ML-% IV SOSY
1.0000 mg | PREFILLED_SYRINGE | INTRAVENOUS | Status: DC | PRN
  Administered 2016-08-09: 1 mg via INTRAVENOUS
  Filled 2016-08-08: qty 2

## 2016-08-08 MED ORDER — ONDANSETRON HCL 4 MG PO TABS: 4.0000 mg | ORAL_TABLET | Freq: Four times a day (QID) | ORAL | Status: DC | PRN

## 2016-08-08 MED ORDER — CLONIDINE HCL 0.1 MG PO TABS
0.3000 mg | ORAL_TABLET | Freq: Once | ORAL | Status: AC
  Administered 2016-08-08: 0.3 mg via ORAL
  Filled 2016-08-08: qty 3

## 2016-08-08 MED ORDER — ONDANSETRON HCL 4 MG/2ML IJ SOLN: 4.0000 mg | Freq: Four times a day (QID) | INTRAMUSCULAR | Status: DC | PRN

## 2016-08-08 MED ORDER — ONDANSETRON HCL 4 MG/2ML IJ SOLN
4.0000 mg | Freq: Once | INTRAMUSCULAR | Status: AC
  Administered 2016-08-08: 4 mg via INTRAVENOUS
  Filled 2016-08-08: qty 2

## 2016-08-08 MED ORDER — ACETAMINOPHEN 325 MG PO TABS: 650.0000 mg | ORAL_TABLET | Freq: Four times a day (QID) | ORAL | Status: DC | PRN

## 2016-08-08 MED ORDER — SODIUM CHLORIDE 0.9 % IV BOLUS (SEPSIS)
1000.0000 mL | Freq: Once | INTRAVENOUS | Status: AC
  Administered 2016-08-08: 1000 mL via INTRAVENOUS

## 2016-08-08 MED ORDER — ACETAMINOPHEN 650 MG RE SUPP: 650.0000 mg | Freq: Four times a day (QID) | RECTAL | Status: DC | PRN

## 2016-08-08 MED ORDER — APIXABAN 5 MG PO TABS
10.0000 mg | ORAL_TABLET | Freq: Two times a day (BID) | ORAL | Status: DC
  Administered 2016-08-08 – 2016-08-09 (×2): 10 mg via ORAL
  Filled 2016-08-08 (×2): qty 2
  Filled 2016-08-08: qty 4
  Filled 2016-08-08: qty 2

## 2016-08-08 MED ORDER — HYDROMORPHONE HCL 2 MG PO TABS
2.0000 mg | ORAL_TABLET | Freq: Once | ORAL | Status: AC
  Administered 2016-08-08: 2 mg via ORAL
  Filled 2016-08-08: qty 1

## 2016-08-08 MED ORDER — APIXABAN 5 MG PO TABS: 10.0000 mg | ORAL_TABLET | Freq: Two times a day (BID) | ORAL | Status: DC

## 2016-08-08 MED ORDER — HYDROMORPHONE HCL 4 MG PO TABS
4.0000 mg | ORAL_TABLET | ORAL | Status: DC | PRN
  Administered 2016-08-09 (×2): 4 mg via ORAL
  Filled 2016-08-08 (×2): qty 1

## 2016-08-08 MED ORDER — IBUPROFEN 200 MG PO TABS: 400.0000 mg | ORAL_TABLET | Freq: Four times a day (QID) | ORAL | Status: DC | PRN

## 2016-08-08 MED ORDER — CLONIDINE HCL 0.3 MG PO TABS
0.3000 mg | ORAL_TABLET | Freq: Three times a day (TID) | ORAL | Status: DC
  Administered 2016-08-09: 0.3 mg via ORAL
  Filled 2016-08-08: qty 1

## 2016-08-08 MED ORDER — APIXABAN 5 MG PO TABS: 5.0000 mg | ORAL_TABLET | Freq: Two times a day (BID) | ORAL | Status: DC

## 2016-08-08 MED ORDER — IOPAMIDOL (ISOVUE-370) INJECTION 76%
INTRAVENOUS | Status: AC
  Administered 2016-08-08: 83 mL
  Filled 2016-08-08: qty 100

## 2016-08-08 NOTE — H&P (Signed)
History and Physical  Patient Name: Dana Morrow     GNF:[id number]    DOB: 01-23-1965    DOA: 08/08/2016 PCP: Jeoffrey MassedMcGowen, Philip H, MD  Patient coming from: Home  Chief Complaint: Chest pain, SOB      HPI: Dana Morrow is a 51 y.o. female with a past medical history significant for CRPS on chronic dilaudid, HTN who presents with chest pain.  The patient was in her usual state of health until today around noon, she was at home watching TV and had sudden onset sharp severe chest pain, worse with inspiration and associated with some SOB.  This persisted for an hour so she came to the ER.  She has had no hemoptysis, recent surgery, cancer diagnosis.  She was however admitted to the hospital for pneumonia for 8 days about 6 weeks ago, and was immobile, and also some right leg swelling was noted in the ER.  ED course: -Afebrile, heart rate 104, RR and SpO2 normal, BP 159/80 -Na 136, K 4.8, Cr 0.85, WBC 10.2K, Hgb 13.7 -Troponin negative x2 -CTA chest showed bilateral small peripheral PE, no heart strain -RLE doppler US showed popliteal DVT -She was given fluids and TRH were asked to evaluate for admission for new PE        ROS: Review of Systems  Respiratory: Positive for shortness of breath.   Cardiovascular: Positive for chest pain.  All other systems reviewed and are negative.         Past Medical History:  Diagnosis Date  . Abdominal pain, chronic, left lower quadrant    a. s/p Laproscopy in 2004 w/o finding of source of discomfort.  . Anxiety and depression 2013   Lots of stress  . Chest pain    a. reportedly nl MV and echo 10/2011 - [place]  . Cholelithiasis    a. s/p cholecystectomy 10/2011  . Colon polyps    a. s/p colonoscopy and polypectomy in 2003  . Diverticulosis    with ? of 'itis  . Family history of colon cancer    mother  . GERD (gastroesophageal reflux disease)   . Hiatal hernia    a. small by EGD 2006.  [name]. History of amaurosis fugax 01/2011   L  eye; saw neuro at [facility] (Dr. Johnell ComingsMieden).  Lab w/u and carotid doppler nl per old records.  . Hyperlipidemia    LDL 191 in 2013 per old records  . Hypertension   . Migraine   . RSD (reflex sympathetic dystrophy)    a. L shoulder--pain mgmt with Dr. Marlynn [facility]kingMalloy at [facility] [place]  . Tobacco abuse    a. 50 pack year hx.  . Visual disturbance    a. 12/2011 Left eye visual changes->w/u for TIA->Carotid U/S:  No ICA stenosis; Head CT: No acute abnl; MRI/MRA: mild RPCA stenosis; Echo: EF 55-60%, Triv AI, No PFO.    Past Surgical History:  Procedure Laterality Date  . CESAREAN SECTION  1990  . COLONOSCOPY  10/2001   Dr. Ewing SchleinMagod; diverticulosis, int/ext hem, one small polyp removed.  . ENDOMETRIAL ABLATION  2011  . ESOPHAGOGASTRODUODENOSCOPY  02/2004   Dr. Ewing SchleinMagod; normal  . LAPAROSCOPIC CHOLECYSTECTOMY  10/19/11  . LAPAROSCOPY  05/2002   Dr. Harmon Dunimothy Davis--no cause for LLQ abd pain found  . SHOULDER SURGERY Left 1997 &98  . UMBILICAL HERNIA REPAIR  2014   with mesh    Social History: Patient lives with her husband.  The patient walks  unassisted.  Nonsmoker.  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  Allergies  Allergen Reactions  . Flagyl [Metronidazole] Nausea And Vomiting    Oral form only, IV pt can tolerate  . Elavil [Amitriptyline]     hallucinations  . Klonopin [Clonazepam]     Hallucinations   . Levaquin [Levofloxacin Hemihydrate] Hives    Family history: family history includes Colon cancer in her mother; Dementia in her father; Esophageal cancer in her brother; Heart attack in her mother; Heart disease in her father and mother; Hyperlipidemia in her father and mother; Hypertension in her father and mother; Parkinson's disease in her father.  Prior to Admission medications   Medication Sig Start Date End Date Taking? Authorizing Provider  cloNIDine (CATAPRES) 0.3 MG tablet TAKE ONE TABLET  BY MOUTH 3 TIMES DAILY 07/30/16  Yes McGowen, Maryjean Morn, MD  HYDROmorphone (DILAUDID) 2 MG tablet Take by mouth 4 (four) times daily as needed.    Yes [provider]  albuterol (VENTOLIN HFA) 108 (90 Base) MCG/ACT inhaler Inhale 1-2 puffs into the lungs every 6 (six) hours as needed for wheezing or shortness of breath. 03/14/16   Sharlene Dory, DO       Physical Exam: BP (!) 152/73 (BP Location: Right Leg)   Pulse 78   Temp 98.6 F (37 C) (Oral)   Resp 20   SpO2 99%  General appearance: Well-developed, adult female, alert and in no acute distress.   Eyes: Anicteric, conjunctiva pink, lids and lashes normal. PERRL.    ENT: No nasal deformity, discharge, epistaxis.  Hearing normal. OP moist without lesions.   Neck: No neck masses.  Trachea midline.  No thyromegaly/tenderness. Lymph: No cervical or supraclavicular lymphadenopathy. Skin: Warm and dry.  No jaundice.  No suspicious rashes or lesions. Cardiac: RRR, nl S1-S2, no murmurs appreciated.  Capillary refill is brisk.  JVP normal.  Mild nonpitting right LE edema.  Radial and DP pulses 2+ and symmetric. Respiratory: Normal respiratory rate and rhythm.  CTAB without rales or wheezes. Abdomen: Abdomen soft.  No TTP. No ascites, distension, hepatosplenomegaly.   MSK: No deformities or effusions.  No cyanosis or clubbing.  She holds the right arm gingerly, seems to have pain in it, it almost appears somehwat atrophied. Neuro: Cranial nerves normal.  Sensation intact to light touch. Speech is fluent.  Muscle strength normal except RUE.    Psych: Sensorium intact and responding to questions, attention normal.  Behavior appropriate.  Affect normal.  Judgment and insight appear normal.     Labs on Admission:  I have personally reviewed following labs and imaging studies: CBC:  Recent Labs Lab 08/08/16 1502  WBC 10.2  HGB 13.7  HCT 38.9  MCV 89.2  PLT 203   Basic Metabolic Panel:  Recent Labs Lab 08/08/16 1502    NA 136  K 4.8  CL 104  CO2 28  GLUCOSE 110*  BUN 12  CREATININE 0.85  CALCIUM 8.8*   GFR: Estimated Creatinine Clearance: 81 mL/min (by C-G formula based on SCr of 0.85 mg/dL).  CBG:  Recent Labs Lab 08/08/16 1510  GLUCAP 119*     Radiological Exams on Admission: Personally reviewed CTA report reviewed: Ct Angio Chest Pe W/cm &/or Wo Cm  Result Date: 08/08/2016 CLINICAL DATA:  Chest pain.  Shortness of breath.  Known DVT. EXAM: CT ANGIOGRAPHY CHEST WITH CONTRAST TECHNIQUE: Multidetector CT imaging of the chest was performed using the standard protocol during bolus  administration of intravenous contrast. Multiplanar CT image reconstructions and MIPs were obtained to evaluate the vascular anatomy. CONTRAST:  83 cc of Isovue 370 COMPARISON:  None. FINDINGS: Cardiovascular: Right and left coronary artery calcifications including calcification in the left main coronary artery. The heart size is normal. The thoracic aorta is non aneurysmal with no dissection and mild atherosclerotic change. Scattered pulmonary emboli identified. There is involvement of both lower lobes and both upper lobes with relative sparing of the middle lobe. Most of the emboli are small relatively peripheral. The right ventricular/ left ventricular ratio is less than 0.9. No evidence of heart strain. Mediastinum/Nodes: No enlarged mediastinal, hilar, or axillary lymph nodes. Thyroid gland, trachea, and esophagus demonstrate no significant findings. Lungs/Pleura: Central airways are normal. No pneumothorax. No pulmonary nodules, masses, or infiltrates. Upper Abdomen: Previous cholecystectomy. No other abnormalities in the upper abdomen. Musculoskeletal: No chest wall abnormality. No acute or significant osseous findings. Review of the MIP images confirms the above findings. IMPRESSION: 1. Bilateral pulmonary emboli with no evidence of heart strain. 2. Right and left coronary artery calcifications including calcification in  the left main coronary artery. 3. Atherosclerotic change in the thoracic aorta. 4. No other acute abnormalities. The findings were called to the patient's PA, Lyndel SafeElizabeth Hammond. Aortic Atherosclerosis (ICD10-I70.0). Electronically Signed   By: Gerome Samavid  Williams III M.D   On: 08/08/2016 20:42    EKG: Independently reviewed. Rate 98, QTc 463.  No ST changes.  RLE DVT study:  Prelim report suggests R popliteal DVT.          Assessment/Plan  1. Acute PE:  Bilateral, peripheral PEs.  Completely hemodynamically stable.  Seems provoked in setting of recent immobility for pneumonia/hospitalization.  No previous PE.  No previous bleeding.  PESI 52, strictly for age, lowest risk category.  HASBLED 1.   -Obtain echocardiogram -Start apixaban -Discussed 3-6 mo treatment for provoked PE, risks of bleeding -Consult to CM for medication mgmt -Likely home tomorrow if pain controlled (with acetaminophen, additional Dilaudid, or low dose ibuprofen)   2. Chronic pain:  -Continue home Dilaudid PO  3. HTN:  -Continue home clonidine       DVT prophylaxis: N/A  Code Status: FULL  Family Communication: Husband at bedside  Disposition Plan: Anticipate start apixaban.  If CM can confirm drug coverage and echo normal and pain controlled on acetaminophen plus home Dilaudid, home tomorrow.  Patient was hoping to go to the beach tomorrow. Consults called: None Admission status: OBS At the point of initial evaluation, it is my clinical opinion that admission for OBSERVATION is reasonable and necessary because the patient's presenting complaints in the context of their chronic conditions represent sufficient risk of deterioration or significant morbidity to constitute reasonable grounds for close observation in the hospital setting, but that the patient may be medically stable for discharge from the hospital within 24 to 48 hours.    Medical decision making: Patient seen at 9:05 PM on 08/08/2016.  The  patient was discussed with Lyndel SafeElizabeth Hammond, PA-C.  What exists of the patient's chart was reviewed in depth and summarized above.  Clinical condition: stable.        Alberteen SamChristopher P Ramey Schiff [facility] Pager [phone]

## 2016-08-08 NOTE — ED Triage Notes (Signed)
Chest pain with SOB since 1245 with associated nausea.

## 2016-08-08 NOTE — Progress Notes (Signed)
*  PRELIMINARY RESULTS* Vascular Ultrasound Lower extremity venous duplex has been completed.  Preliminary findings: DVT noted in the right popliteal vein. No DVT LLE.    Gave results to MahinahinaJacqueline, Charity fundraiserN.  Farrel DemarkJill Eunice, RDMS, RVT  08/08/2016, 6:56 PM

## 2016-08-08 NOTE — ED Notes (Signed)
Call Melanie, RN with report at 10:15 [phone]

## 2016-08-08 NOTE — ED Notes (Signed)
Called floor 10pm spoke w melanie rn, to call back

## 2016-08-08 NOTE — ED Provider Notes (Signed)
[facility]-EMERGENCY DEPT Provider Note   CSN: [id number] Arrival date & time: 08/08/16  1418     History   Chief Complaint Chief Complaint  Patient presents with  . Chest Pain    HPI Dana Morrow is a 51 y.o. female who presents with chest pain and SOB present since 1245.  She reports her pain began all of a sudden while watching TV.  It is sub sternal, feels "deep" is sharp.  She reports mild nausea, with out vomiting, shortness of breath that has been gradually improving.  She also reports she has RSD and has been having stellate ganglion blocks for about 20 years but has not gotten one recently as her pain management center has changed.  She reports her RSD affects her left arm, neck and occasionally chest.  She reports that when her RSD flairs she will have gradual onset of chest pain but this is different as it is sudden onset, is associated with nausea, and SOB.  She denies any estrogen use, reports she smokes, denies recent long car trips or leg swelling.  No personal history of blood clots.   She reports she is due for her home dose of clonidine 0.3mg  and dilaudid 2mg .  She produced both bottles that have approximately correct amount of pills, are correctly dated and have the correct doctor listed.    HPI  Past Medical History:  Diagnosis Date  . Abdominal pain, chronic, left lower quadrant    a. s/p Laproscopy in 2004 w/o finding of source of discomfort.  . Anxiety and depression 2013   Lots of stress  . Chest pain    a. reportedly nl MV and echo 10/2011 - [place]  . Cholelithiasis    a. s/p cholecystectomy 10/2011  . Colon polyps    a. s/p colonoscopy and polypectomy in 2003  . Diverticulosis    with ? of 'itis  . Family history of colon cancer    mother  . GERD (gastroesophageal reflux disease)   . Hiatal hernia    a. small by EGD 2006.  [name] History of amaurosis fugax 01/2011   L eye; saw neuro at [facility] (Dr. Johnell Comings).  Lab w/u and  carotid doppler nl per old records.  . Hyperlipidemia    LDL 191 in 2013 per old records  . Hypertension   . Migraine   . RSD (reflex sympathetic dystrophy)    a. L shoulder--pain mgmt with Dr. Marlynn Perking at [facility] [place]  . Tobacco abuse    a. 50 pack year hx.  . Visual disturbance    a. 12/2011 Left eye visual changes->w/u for TIA->Carotid U/S:  No ICA stenosis; Head CT: No acute abnl; MRI/MRA: mild RPCA stenosis; Echo: EF 55-60%, Triv AI, No PFO.    Patient Active Problem List   Diagnosis Date Noted  . Acute pulmonary embolism (HCC) 08/08/2016  . Acute deep vein thrombosis (DVT) of right popliteal vein (HCC) 08/08/2016  . Chronic pain syndrome 11/24/2015  . Essential hypertension 11/24/2015  . Complex regional pain syndrome I of upper limb 01/16/2013    Past Surgical History:  Procedure Laterality Date  . CESAREAN SECTION  1990  . COLONOSCOPY  10/2001   Dr. Ewing Schlein; diverticulosis, int/ext hem, one small polyp removed.  . ENDOMETRIAL ABLATION  2011  . ESOPHAGOGASTRODUODENOSCOPY  02/2004   Dr. Ewing Schlein; normal  . LAPAROSCOPIC CHOLECYSTECTOMY  10/19/11  . LAPAROSCOPY  05/2002   Dr. Harmon Dun cause for LLQ abd pain found  .  SHOULDER SURGERY Left 1997 &98  . UMBILICAL HERNIA REPAIR  2014   with mesh    OB History    No data available       Home Medications    Prior to Admission medications   Medication Sig Start Date End Date Taking? Authorizing Provider  cloNIDine (CATAPRES) 0.3 MG tablet TAKE ONE TABLET BY MOUTH 3 TIMES DAILY 07/30/16  Yes McGowen, Maryjean MornPhilip H, MD  HYDROmorphone (DILAUDID) 2 MG tablet Take by mouth 4 (four) times daily as needed.    Yes [provider]  albuterol (VENTOLIN HFA) 108 (90 Base) MCG/ACT inhaler Inhale 1-2 puffs into the lungs every 6 (six) hours as needed for wheezing or shortness of breath. 03/14/16   Sharlene DoryWendling, Nicholas Paul, DO    Family History Family History  Problem Relation Age of Onset  . Dementia  Father   . Parkinson's disease Father        d. 2013  . Hypertension Father   . Hyperlipidemia Father   . Heart disease Father   . Heart attack Mother        died @ 5067  . Colon cancer Mother   . Hyperlipidemia Mother   . Heart disease Mother   . Hypertension Mother   . Esophageal cancer Brother   . Diabetes Neg Hx     Social History Social History  Substance Use Topics  . Smoking status: Current Every Day Smoker    Packs/day: 0.50    Years: 30.00    Types: Cigarettes  . Smokeless tobacco: Never Used  . Alcohol use No     Allergies   Flagyl [metronidazole]; Elavil [amitriptyline]; Klonopin [clonazepam]; and Levaquin [levofloxacin hemihydrate]   Review of Systems Review of Systems  Constitutional: Negative for chills and fever.  HENT: Negative for ear pain and sore throat.   Eyes: Negative for pain and visual disturbance.  Respiratory: Positive for chest tightness and shortness of breath. Negative for cough.   Cardiovascular: Positive for chest pain. Negative for palpitations and leg swelling.  Gastrointestinal: Positive for nausea. Negative for abdominal pain, diarrhea and vomiting.  Genitourinary: Negative for dysuria and hematuria.  Musculoskeletal: Negative for arthralgias and back pain.       Normal LUE pain from RSD pain flair  Skin: Negative for color change and rash.  Neurological: Negative for seizures and syncope.  All other systems reviewed and are negative.    Physical Exam Updated Vital Signs BP (!) 167/82 (BP Location: Left Leg)   Pulse 71   Temp 97.9 F (36.6 C) (Oral)   Resp 18   Ht 5\' 3"  (1.6 m)   Wt 90 kg (198 lb 6.6 oz)   SpO2 100%   BMI 35.15 kg/m   Physical Exam  Constitutional: She appears well-developed and well-nourished. No distress.  HENT:  Head: Normocephalic and atraumatic.  Eyes: Conjunctivae are normal.  Neck: Neck supple.  Cardiovascular: Regular rhythm, normal heart sounds and normal pulses.  Tachycardia present.   No  murmur heard. Pulmonary/Chest: Effort normal and breath sounds normal. No accessory muscle usage. No respiratory distress. She has no decreased breath sounds. She has no wheezes. She has no rhonchi.  Abdominal: Soft. Bowel sounds are normal. There is no hepatosplenomegaly. There is tenderness in the left upper quadrant. There is no rigidity, no guarding and no CVA tenderness.  TTP LUQ, however is non painful with out TTP.   Musculoskeletal: She exhibits no edema.  Right calf is obviously swollen when compared to left.  No pitting edema. No calf tenderness. Bilateral feet are warm and well perfused.   Neurological: She is alert.  Skin: Skin is warm and dry.  Psychiatric: She has a normal mood and affect. Her behavior is normal.  Nursing note and vitals reviewed.    ED Treatments / Results  Labs (all labs ordered are listed, but only abnormal results are displayed) Labs Reviewed  BASIC METABOLIC PANEL - Abnormal; Notable for the following:       Result Value   Glucose, Bld 110 (*)    Calcium 8.8 (*)    Anion gap 4 (*)    All other components within normal limits  CBG MONITORING, ED - Abnormal; Notable for the following:    Glucose-Capillary 119 (*)    All other components within normal limits  CBC  HIV ANTIBODY (ROUTINE TESTING)  I-STAT TROPONIN, ED  I-STAT TROPONIN, ED    EKG  EKG Interpretation  Date/Time:  Thursday August 08 2016 14:29:35 EDT Ventricular Rate:  98 PR Interval:    QRS Duration: 85 QT Interval:  362 QTC Calculation: 463 R Axis:   55 Text Interpretation:  Sinus rhythm Probable left atrial enlargement Since last tracing rate slower Confirmed by Mancel Bale [phone]) on 08/08/2016 4:38:45 PM       Radiology Ct Angio Chest Pe W/cm &/or Wo Cm  Result Date: 08/08/2016 CLINICAL DATA:  Chest pain.  Shortness of breath.  Known DVT. EXAM: CT ANGIOGRAPHY CHEST WITH CONTRAST TECHNIQUE: Multidetector CT imaging of the chest was performed using the standard protocol  during bolus administration of intravenous contrast. Multiplanar CT image reconstructions and MIPs were obtained to evaluate the vascular anatomy. CONTRAST:  83 cc of Isovue 370 COMPARISON:  None. FINDINGS: Cardiovascular: Right and left coronary artery calcifications including calcification in the left main coronary artery. The heart size is normal. The thoracic aorta is non aneurysmal with no dissection and mild atherosclerotic change. Scattered pulmonary emboli identified. There is involvement of both lower lobes and both upper lobes with relative sparing of the middle lobe. Most of the emboli are small relatively peripheral. The right ventricular/ left ventricular ratio is less than 0.9. No evidence of heart strain. Mediastinum/Nodes: No enlarged mediastinal, hilar, or axillary lymph nodes. Thyroid gland, trachea, and esophagus demonstrate no significant findings. Lungs/Pleura: Central airways are normal. No pneumothorax. No pulmonary nodules, masses, or infiltrates. Upper Abdomen: Previous cholecystectomy. No other abnormalities in the upper abdomen. Musculoskeletal: No chest wall abnormality. No acute or significant osseous findings. Review of the MIP images confirms the above findings. IMPRESSION: 1. Bilateral pulmonary emboli with no evidence of heart strain. 2. Right and left coronary artery calcifications including calcification in the left main coronary artery. 3. Atherosclerotic change in the thoracic aorta. 4. No other acute abnormalities. The findings were called to the patient's PA, Lyndel Safe. Aortic Atherosclerosis (ICD10-I70.0). Electronically Signed   By: Gerome Sam III M.D   On: 08/08/2016 20:42    Procedures Procedures (including critical care time)  Medications Ordered in ED Medications  apixaban (ELIQUIS) tablet 10 mg (10 mg Oral Given 08/08/16 2301)    Followed by  apixaban (ELIQUIS) tablet 5 mg (not administered)  cloNIDine (CATAPRES) tablet 0.3 mg (not administered)    HYDROmorphone (DILAUDID) tablet 4 mg (not administered)  acetaminophen (TYLENOL) tablet 650 mg (not administered)    Or  acetaminophen (TYLENOL) suppository 650 mg (not administered)  ibuprofen (ADVIL,MOTRIN) tablet 400 mg (not administered)  ondansetron (ZOFRAN) tablet 4 mg (not administered)  Or  ondansetron (ZOFRAN) injection 4 mg (not administered)  HYDROmorphone (DILAUDID) injection 1 mg (not administered)  cloNIDine (CATAPRES) tablet 0.3 mg (0.3 mg Oral Given 08/08/16 1522)  HYDROmorphone (DILAUDID) tablet 2 mg (2 mg Oral Given 08/08/16 1522)  ondansetron (ZOFRAN) injection 4 mg (4 mg Intravenous Given 08/08/16 1523)  HYDROmorphone (DILAUDID) tablet 2 mg (2 mg Oral Given 08/08/16 2012)  ondansetron (ZOFRAN) injection 4 mg (4 mg Intravenous Given 08/08/16 2011)  sodium chloride 0.9 % bolus 1,000 mL (1,000 mLs Intravenous New Bag/Given 08/08/16 2011)  iopamidol (ISOVUE-370) 76 % injection (83 mLs  Contrast Given 08/08/16 2012)     Initial Impression / Assessment and Plan / ED Course  I have reviewed the triage vital signs and the nursing notes.  Pertinent labs & imaging results that were available during my care of the patient were reviewed by me and considered in my medical decision making (see chart for details).  Clinical Course as of Aug 08 2329  Thu Aug 08, 2016  1800 Patient re-checked, needs larger IV so can have contrast.   [EH]  1915 DVT in R popliteal vein.   [EH]  1944 Patient rechecked, will order home dilaudid again.  Earlier it reduced her arm pain but did not change her chest pain.   [EH]  2041 Spoke with radiologist, patient has multiple smaller peripheral PEs.   [EH]  2101 Spoke with hospitalist, will come see patient due to continued pain in setting of PE and DVT.   [EH]    Clinical Course User Index [EH] Cristina GongHammond, Siarah Deleo W, PA-C   Dana Morrow presents to the ED after sudden onset of substernal chest pain that began while she was watching TV. Troponins 2 were  obtained which were unremarkable. Initial EKG and labs reassuring. Her home pain and BP meds were ordered.  Physical exam with right calf swelling. DVT was found in right popliteal vein.  CTA chest was obtained with multiple small PE with out evidence of right heat strain.  Hospitalist consulted for admission based on new PE with continued pain.  Patient will be admitted for further evaluation.   The patient appears reasonably stabilized for admission considering the current resources, flow, and capabilities available in the ED at this time, and I doubt any other [facility] requiring further screening and/or treatment in the ED prior to admission. The patient was seen by Dr. Effie ShyWentz who evaluated the patient and agreed with my plan.      Final Clinical Impressions(s) / ED Diagnoses   Final diagnoses:  Chest pain, unspecified type  Other acute pulmonary embolism without acute cor pulmonale (HCC)  Acute deep vein thrombosis (DVT) of popliteal vein of right lower extremity [place](HCC)    New Prescriptions Current Discharge Medication List       Norman ClayHammond, Tyrease Vandeberg W, PA-C 08/08/16 2338    Mancel BaleWentz, Elliott, MD 08/10/16 1623

## 2016-08-08 NOTE — ED Provider Notes (Signed)
  Face-to-face evaluation   History: She presents for evaluation of worsening left arm and chest pain, which started today.  The chest pain is constant.  She has previously had pain like this, but it usually does not start suddenly.  She has missed a stellate ganglion block which she takes every 2 months, for RSD.  Physical exam: Alert, cooperative.  No respiratory distress.  Right leg swollen, mild, but nontender to palpation.  Medical screening examination/treatment/procedure(s) were conducted as a shared visit with non-physician practitioner(s) and myself.  I personally evaluated the patient during the encounter   Mancel BaleWentz, Dana Pozo, MD 08/10/16 [phone] [id number]

## 2016-08-09 ENCOUNTER — Observation Stay (HOSPITAL_BASED_OUTPATIENT_CLINIC_OR_DEPARTMENT_OTHER): Payer: Medicare Other

## 2016-08-09 DIAGNOSIS — I10 Essential (primary) hypertension: Secondary | ICD-10-CM | POA: Diagnosis not present

## 2016-08-09 DIAGNOSIS — I82431 Acute embolism and thrombosis of right popliteal vein: Secondary | ICD-10-CM

## 2016-08-09 DIAGNOSIS — G894 Chronic pain syndrome: Secondary | ICD-10-CM | POA: Diagnosis not present

## 2016-08-09 DIAGNOSIS — I2699 Other pulmonary embolism without acute cor pulmonale: Secondary | ICD-10-CM | POA: Diagnosis not present

## 2016-08-09 DIAGNOSIS — R079 Chest pain, unspecified: Secondary | ICD-10-CM

## 2016-08-09 DIAGNOSIS — I351 Nonrheumatic aortic (valve) insufficiency: Secondary | ICD-10-CM

## 2016-08-09 HISTORY — PX: TRANSTHORACIC ECHOCARDIOGRAM: SHX275

## 2016-08-09 LAB — HIV ANTIBODY (ROUTINE TESTING W REFLEX): HIV Screen 4th Generation wRfx: NONREACTIVE

## 2016-08-09 LAB — ECHOCARDIOGRAM COMPLETE
Height: 63 in
Weight: 3174.62 oz

## 2016-08-09 MED ORDER — APIXABAN 5 MG PO TABS: 5.0000 mg | ORAL_TABLET | Freq: Two times a day (BID) | ORAL | 0 refills | Status: DC

## 2016-08-09 MED ORDER — IBUPROFEN 200 MG PO TABS: 400.0000 mg | ORAL_TABLET | Freq: Four times a day (QID) | ORAL | Status: DC | PRN

## 2016-08-09 MED ORDER — OXYCODONE-ACETAMINOPHEN 5-325 MG PO TABS: 1.0000 | ORAL_TABLET | Freq: Four times a day (QID) | ORAL | 0 refills | Status: AC | PRN

## 2016-08-09 MED ORDER — OXYCODONE-ACETAMINOPHEN 5-325 MG PO TABS: 1.0000 | ORAL_TABLET | Freq: Four times a day (QID) | ORAL | 0 refills | Status: DC | PRN

## 2016-08-09 NOTE — Progress Notes (Signed)
  2D Echocardiogram has been performed.  Dana Morrow 08/09/2016, 9:32 AM

## 2016-08-09 NOTE — Discharge Summary (Signed)
Physician Discharge Summary  Dana Morrow:[id number] DOB: Aug 27, 1965 DOA: 08/08/2016  PCP: Jeoffrey Massed, MD  Admit date: 08/08/2016 Discharge date: 08/09/2016  Admitted From: Home Disposition: Home  Recommendations for Outpatient Follow-up:  1. Follow up with PCP in 1-2 weeks 2. Take Eliquis 10 mg twice daily for total of 7 days and then take Eliquis 5 mg twice daily thereafter  Home Health: None Equipment/Devices: None Discharge Condition: None CODE STATUS: Full Diet recommendation: Return to home diet  Brief/Interim Summary: 51 year old female with past medical history of chronic pain syndrome, essential hypertension came to the ER with complaints of shortness of breath and sudden chest pain. Upon admission CTA of the chest was done which was positive for pulmonary embolism in her lower extremity Doppler showed acute DVT in right popliteal vein. She was started on heparin drip and the following day she was transitioned to Eliquis. Echocardiogram was done which did not show any hemodynamic instability or compromise, was noted to have a normal systolic function with mild diastolic dysfunction. Today she does not have any complaints days of chest pain is resolved and her breathing is better. She will be discharged on anticoagulation and we have discussed the side effects and the benefits extensively. Patient agrees to follow-up outpatient with recommendations as stated above.  Discharge Diagnoses:  Principal Problem:   Acute pulmonary embolism (HCC) Active Problems:   Chronic pain syndrome   Complex regional pain syndrome I of upper limb   Essential hypertension   Acute deep vein thrombosis (DVT) of right popliteal vein (HCC)  Acute pulmonary embolism -Bilateral pulmonary embolism without any evidence of heart strain -Heparin drip transition to Oral Eliquis -Lower extreme Dopplers showed acute DVT in right popliteal vein. Echocardiogram did not show any significant hemodynamic  compromise with normal LV function. She understands she needs to be on anticoagulation for at least 3-6 months and further determination will be made by her primary care physician  History of chronic pain -She is on home Dilaudid orally. I will also order Roxicet for breakthrough pain for short-term until her next pain management appointment.  History of hypertension -Continue home medication  Discharge Instructions   Allergies as of 08/09/2016      Reactions   Flagyl [metronidazole] Nausea And Vomiting   Oral form only, IV pt can tolerate   Elavil [amitriptyline]    hallucinations   Klonopin [clonazepam]    Hallucinations   Levaquin [levofloxacin Hemihydrate] Hives      Medication List    TAKE these medications   albuterol 108 (90 Base) MCG/ACT inhaler Commonly known as:  VENTOLIN HFA Inhale 1-2 puffs into the lungs every 6 (six) hours as needed for wheezing or shortness of breath.   apixaban 5 MG Tabs tablet Commonly known as:  ELIQUIS Take 1 tablet (5 mg total) by mouth 2 (two) times daily. Take 10 mg (2 tabs) twice daily for 7 days then  Take 5mg  orally twice daily there after. Start taking on:  08/15/2016   cloNIDine 0.3 MG tablet Commonly known as:  CATAPRES TAKE ONE TABLET BY MOUTH 3 TIMES DAILY   HYDROmorphone 2 MG tablet Commonly known as:  DILAUDID Take by mouth 4 (four) times daily as needed.   oxyCODONE-acetaminophen 5-325 MG tablet Commonly known as:  ROXICET Take 1 tablet by mouth every 6 (six) hours as needed.       Allergies  Allergen Reactions  . Flagyl [Metronidazole] Nausea And Vomiting    Oral form only, IV pt  can tolerate  . Elavil [Amitriptyline]     hallucinations  . Klonopin [Clonazepam]     Hallucinations   . Levaquin [Levofloxacin Hemihydrate] Hives    On your next visit with your primary care physician please Get Medicines reviewed and adjusted.   Please request your Prim.MD to go over all Hospital Tests and  Procedure/Radiological results at the follow up, please get all Hospital records sent to your Prim MD by signing hospital release before you go home.   If you experience worsening of your admission symptoms, develop shortness of breath, life threatening emergency, suicidal or homicidal thoughts you must seek medical attention immediately by calling 911 or calling your MD immediately  if symptoms less severe.  You Must read complete instructions/literature along with all the possible adverse reactions/side effects for all the Medicines you take and that have been prescribed to you. Take any new Medicines after you have completely understood and accpet all the possible adverse reactions/side effects.   Do not drive, operate heavy machinery, perform activities at heights, swimming or participation in water activities or provide baby sitting services if your were admitted for syncope or siezures until you have seen by Primary MD or a Neurologist and advised to do so again.  Do not drive when taking Pain medications.    Do not take more than prescribed Pain, Sleep and Anxiety Medications  Special Instructions: If you have smoked or chewed Tobacco  in the last 2 yrs please stop smoking, stop any regular Alcohol  and or any Recreational drug use.  Wear Seat belts while driving.   Please note  You were cared for by a hospitalist during your hospital stay. If you have any questions about your discharge medications or the care you received while you were in the hospital after you are discharged, you can call the unit and asked to speak with the hospitalist on call if the hospitalist that took care of you is not available. Once you are discharged, your primary care physician will handle any further medical issues. Please note that NO REFILLS for any discharge medications will be authorized once you are discharged, as it is imperative that you return to your primary care physician (or establish  a relationship with a primary care physician if you do not have one) for your aftercare needs so that they can reassess your need for medications and monitor your lab values.   Increase activity slowly        Consultations:  None   Procedures/Studies: Ct Angio Chest Pe W/cm &/or Wo Cm  Result Date: 08/08/2016 CLINICAL DATA:  Chest pain.  Shortness of breath.  Known DVT. EXAM: CT ANGIOGRAPHY CHEST WITH CONTRAST TECHNIQUE: Multidetector CT imaging of the chest was performed using the standard protocol during bolus administration of intravenous contrast. Multiplanar CT image reconstructions and MIPs were obtained to evaluate the vascular anatomy. CONTRAST:  83 cc of Isovue 370 COMPARISON:  None. FINDINGS: Cardiovascular: Right and left coronary artery calcifications including calcification in the left main coronary artery. The heart size is normal. The thoracic aorta is non aneurysmal with no dissection and mild atherosclerotic change. Scattered pulmonary emboli identified. There is involvement of both lower lobes and both upper lobes with relative sparing of the middle lobe. Most of the emboli are small relatively peripheral. The right ventricular/ left ventricular ratio is less than 0.9. No evidence of heart strain. Mediastinum/Nodes: No enlarged mediastinal, hilar, or axillary lymph nodes. Thyroid gland, trachea, and esophagus demonstrate no significant findings.  Lungs/Pleura: Central airways are normal. No pneumothorax. No pulmonary nodules, masses, or infiltrates. Upper Abdomen: Previous cholecystectomy. No other abnormalities in the upper abdomen. Musculoskeletal: No chest wall abnormality. No acute or significant osseous findings. Review of the MIP images confirms the above findings. IMPRESSION: 1. Bilateral pulmonary emboli with no evidence of heart strain. 2. Right and left coronary artery calcifications including calcification in the left main coronary artery. 3. Atherosclerotic change in the  thoracic aorta. 4. No other acute abnormalities. The findings were called to the patient's PA, Lyndel SafeElizabeth Hammond. Aortic Atherosclerosis (ICD10-I70.0). Electronically Signed   By: Gerome Samavid  Williams III M.D   On: 08/08/2016 20:42       Subjective:   Discharge Exam: Vitals:   08/08/16 2237 08/09/16 0454  BP: (!) 167/82 (!) 129/58  Pulse: 71 73  Resp: 18 16  Temp: 97.9 F (36.6 C) 98.2 F (36.8 C)   Vitals:   08/08/16 2104 08/08/16 2209 08/08/16 2237 08/09/16 0454  BP: (!) 152/73 (!) 165/79 (!) 167/82 (!) 129/58  Pulse: 78 77 71 73  Resp: 20 20 18 16   Temp: 98.6 F (37 C) 98.6 F (37 C) 97.9 F (36.6 C) 98.2 F (36.8 C)  TempSrc: Oral Oral Oral Oral  SpO2: 99% 99% 100% 94%  Weight:   90 kg (198 lb 6.6 oz)   Height:   5\' 3"  (1.6 m)     General: Pt is alert, awake, not in acute distress Cardiovascular: RRR, S1/S2 +, no rubs, no gallops Respiratory: CTA bilaterally, no wheezing, no rhonchi Abdominal: Soft, NT, ND, bowel sounds + Extremities: no edema, no cyanosis    The results of significant diagnostics from this hospitalization (including imaging, microbiology, ancillary and laboratory) are listed below for reference.     Microbiology: No results found for this or any previous visit (from the past 240 hour(s)).   Labs: BNP (last 3 results) No results for input(s): BNP in the last 8760 hours. Basic Metabolic Panel:  Recent Labs Lab 08/08/16 1502  NA 136  K 4.8  CL 104  CO2 28  GLUCOSE 110*  BUN 12  CREATININE 0.85  CALCIUM 8.8*   Liver Function Tests: No results for input(s): AST, ALT, ALKPHOS, BILITOT, PROT, ALBUMIN in the last 168 hours. No results for input(s): LIPASE, AMYLASE in the last 168 hours. No results for input(s): AMMONIA in the last 168 hours. CBC:  Recent Labs Lab 08/08/16 1502  WBC 10.2  HGB 13.7  HCT 38.9  MCV 89.2  PLT 203   Cardiac Enzymes: No results for input(s): CKTOTAL, CKMB, CKMBINDEX, TROPONINI in the last 168  hours. BNP: Invalid input(s): POCBNP CBG:  Recent Labs Lab 08/08/16 1510  GLUCAP 119*   D-Dimer No results for input(s): DDIMER in the last 72 hours. Hgb A1c No results for input(s): HGBA1C in the last 72 hours. Lipid Profile No results for input(s): CHOL, HDL, LDLCALC, TRIG, CHOLHDL, LDLDIRECT in the last 72 hours. Thyroid function studies No results for input(s): TSH, T4TOTAL, T3FREE, THYROIDAB in the last 72 hours.  Invalid input(s): FREET3 Anemia work up No results for input(s): VITAMINB12, FOLATE, FERRITIN, TIBC, IRON, RETICCTPCT in the last 72 hours. Urinalysis    Component Value Date/Time   COLORURINE YELLOW 02/23/2016 1220   APPEARANCEUR CLEAR 02/23/2016 1220   LABSPEC 1.004 (L) 02/23/2016 1220   PHURINE 6.0 02/23/2016 1220   GLUCOSEU NEGATIVE 02/23/2016 1220   HGBUR NEGATIVE 02/23/2016 1220   BILIRUBINUR NEGATIVE 02/23/2016 1220   KETONESUR NEGATIVE 02/23/2016 1220  PROTEINUR NEGATIVE 02/23/2016 1220   UROBILINOGEN 0.2 10/03/2014 1220   NITRITE NEGATIVE 02/23/2016 1220   LEUKOCYTESUR NEGATIVE 02/23/2016 1220   Sepsis Labs Invalid input(s): PROCALCITONIN,  WBC,  LACTICIDVEN Microbiology No results found for this or any previous visit (from the past 240 hour(s)).   Time coordinating discharge: Over 30 minutes  SIGNED:   Dimple Nanas, MD  [facility] 08/09/2016, 12:48 PM Pager   If 7PM-7AM, please contact night-coverage [email or web address] Password [email or web address]

## 2016-08-09 NOTE — Discharge Instructions (Signed)
Information on my medicine - ELIQUIS (apixaban)  This medication education was reviewed with me or my healthcare representative as part of my discharge preparation.  The pharmacist that spoke with me during my hospital stay was: Ulyses Amorustin Z, PharmD Why was Eliquis prescribed for you? Eliquis was prescribed to treat blood clots that may have been found in the veins of your legs (deep vein thrombosis) or in your lungs (pulmonary embolism) and to reduce the risk of them occurring again.  What do You need to know about Eliquis ? The starting dose is 10 mg (two 5 mg tablets) taken TWICE daily for the FIRST SEVEN (7) DAYS, then on Thursday 08/15/16  the dose is reduced to ONE 5 mg tablet taken TWICE daily.  Eliquis may be taken with or without food.   Try to take the dose about the same time in the morning and in the evening. If you have difficulty swallowing the tablet whole please discuss with your pharmacist how to take the medication safely.  Take Eliquis exactly as prescribed and DO NOT stop taking Eliquis without talking to the doctor who prescribed the medication.  Stopping may increase your risk of developing a new blood clot.  Refill your prescription before you run out.  After discharge, you should have regular check-up appointments with your healthcare provider that is prescribing your Eliquis.    What do you do if you miss a dose? If a dose of ELIQUIS is not taken at the scheduled time, take it as soon as possible on the same day and twice-daily administration should be resumed. The dose should not be doubled to make up for a missed dose.  Important Safety Information A possible side effect of Eliquis is bleeding. You should call your healthcare provider right away if you experience any of the following: ? Bleeding from an injury or your nose that does not stop. ? Unusual colored urine (red or dark brown) or unusual colored stools (red or black). ? Unusual bruising for unknown  reasons. ? A serious fall or if you hit your head (even if there is no bleeding).  Some medicines may interact with Eliquis and might increase your risk of bleeding or clotting while on Eliquis. To help avoid this, consult your healthcare provider or pharmacist prior to using any new prescription or non-prescription medications, including herbals, vitamins, non-steroidal anti-inflammatory drugs (NSAIDs) and supplements.  This website has more information on Eliquis (apixaban): http://[email or web address].com/eliquis/home

## 2016-08-09 NOTE — Progress Notes (Signed)
Nutrition Brief Note  Patient identified on the Malnutrition Screening Tool (MST) Report  Wt Readings from Last 15 Encounters:  08/08/16 198 lb 6.6 oz (90 kg)  08/01/16 192 lb (87.1 kg)  05/20/16 198 lb (89.8 kg)  05/08/16 197 lb 12 oz (89.7 kg)  03/19/16 200 lb (90.7 kg)  03/14/16 200 lb 6.4 oz (90.9 kg)  02/23/16 182 lb (82.6 kg)  12/19/15 178 lb (80.7 kg)  11/04/15 172 lb (78 kg)  10/29/15 170 lb (77.1 kg)  08/28/15 172 lb (78 kg)  05/17/15 190 lb (86.2 kg)  05/09/15 188 lb 6 oz (85.4 kg)  05/03/15 190 lb (86.2 kg)  04/18/15 190 lb 8 oz (86.4 kg)    Body mass index is 35.15 kg/m. Patient meets criteria for obese based on current BMI.   Current diet order is regular, patient is consuming approximately 100% of meals at this time per pt reports. Labs and medications reviewed.   No nutrition interventions warranted at this time. If nutrition issues arise, please consult RD.   Vanessa Kickarly Annick Dimaio RD, LDN Clinical Nutrition Pager # [phone]

## 2016-08-09 NOTE — Progress Notes (Signed)
Checked coverage for anticoagulant medication. Pt's part D had lapse. Spoke with pt who states that she will check into insurance.  Encouraged pt to call toll free number on Eliquis 30 day free card for medication assistance if not covered.

## 2016-08-09 NOTE — Progress Notes (Signed)
PT discharged to home.  Husband at bedside.  Pain controlled with oral meds.  Discharge instructions discussed with patient.  Verbalized understanding.  Pt has all belongings.  No further concerns at this time.  Barrie LymeVance, Nalina Yeatman E RN 1:53 PM 08/09/2016

## 2016-08-12 ENCOUNTER — Telehealth: Payer: Self-pay

## 2016-08-12 NOTE — Telephone Encounter (Signed)
LM requesting call back to complete TCM and schedule hosp f/u.  

## 2016-08-16 DIAGNOSIS — Z79899 Other long term (current) drug therapy: Secondary | ICD-10-CM | POA: Diagnosis not present

## 2016-08-16 DIAGNOSIS — M25512 Pain in left shoulder: Secondary | ICD-10-CM | POA: Diagnosis not present

## 2016-08-16 DIAGNOSIS — G8929 Other chronic pain: Secondary | ICD-10-CM | POA: Diagnosis not present

## 2016-08-16 DIAGNOSIS — M79602 Pain in left arm: Secondary | ICD-10-CM | POA: Diagnosis not present

## 2016-08-16 DIAGNOSIS — G9050 Complex regional pain syndrome I, unspecified: Secondary | ICD-10-CM | POA: Diagnosis not present

## 2016-08-21 ENCOUNTER — Ambulatory Visit: Payer: Medicare Other | Admitting: Family Medicine

## 2016-08-21 ENCOUNTER — Encounter: Payer: Self-pay | Admitting: Family Medicine

## 2016-08-21 NOTE — Progress Notes (Deleted)
08/21/2016  CC: No chief complaint on file.   Patient is a 51 y.o. Caucasian female who presents for  hospital follow up. Dates hospitalized: 8/2-8/3, 2018. Days since d/c from hospital: 12 Patient was discharged from hospital to home. Reason for admission to hospital: acute CP and SOB, dx'd with acute PE and right popliteal vein DVT.  I have reviewed patient's discharge summary plus pertinent specific notes, labs, and imaging from the hospitalization.    {current status of patient, symptoms, etc}  Medication reconciliation was done today and patient {is not} {is} taking meds as recommended by discharging hospitalist/specialist.    PMH:  Past Medical History:  Diagnosis Date  . Abdominal pain, chronic, left lower quadrant    a. s/p Laproscopy in 2004 w/o finding of source of discomfort.  . Anxiety and depression 2013   Lots of stress  . Chest pain    a. reportedly nl MV and echo 10/2011 - [place]  . Cholelithiasis    a. s/p cholecystectomy 10/2011  . Colon polyps    a. s/p colonoscopy and polypectomy in 2003  . Diverticulosis    with ? of 'itis  . Family history of colon cancer    mother  . GERD (gastroesophageal reflux disease)   . Hiatal hernia    a. small by EGD 2006.  [name] History of amaurosis fugax 01/2011   L eye; saw neuro at [facility] (Dr. Johnell Comings).  Lab w/u and carotid doppler nl per old records.  . Hyperlipidemia    LDL 191 in 2013 per old records  . Hypertension   . Migraine   . RSD (reflex sympathetic dystrophy)    a. L shoulder--pain mgmt with Dr. Marlynn Perking at [facility] [place]  . Tobacco abuse    a. 50 pack year hx.  . Visual disturbance    a. 12/2011 Left eye visual changes->w/u for TIA->Carotid U/S:  No ICA stenosis; Head CT: No acute abnl; MRI/MRA: mild RPCA stenosis; Echo: EF 55-60%, Triv AI, No PFO.    PSH:  Past Surgical History:  Procedure Laterality Date  . CESAREAN SECTION  1990  .  COLONOSCOPY  10/2001   Dr. Ewing Schlein; diverticulosis, int/ext hem, one small polyp removed.  . ENDOMETRIAL ABLATION  2011  . ESOPHAGOGASTRODUODENOSCOPY  02/2004   Dr. Ewing Schlein; normal  . LAPAROSCOPIC CHOLECYSTECTOMY  10/19/11  . LAPAROSCOPY  05/2002   Dr. Harmon Dun cause for LLQ abd pain found  . SHOULDER SURGERY Left 1997 &98  . UMBILICAL HERNIA REPAIR  2014   with mesh    MEDS:  Outpatient Medications Prior to Visit  Medication Sig Dispense Refill  . albuterol (VENTOLIN HFA) 108 (90 Base) MCG/ACT inhaler Inhale 1-2 puffs into the lungs every 6 (six) hours as needed for wheezing or shortness of breath. 1 Inhaler 1  . apixaban (ELIQUIS) 5 MG TABS tablet Take 1 tablet (5 mg total) by mouth 2 (two) times daily. Take 10 mg (2 tabs) twice daily for 7 days then  Take 5mg  orally twice daily there after. 90 tablet 0  . cloNIDine (CATAPRES) 0.3 MG tablet TAKE ONE TABLET BY MOUTH 3 TIMES DAILY 90 tablet 0  . HYDROmorphone (DILAUDID) 2 MG tablet Take by mouth 4 (four) times daily as needed.     [name] oxyCODONE-acetaminophen (ROXICET) 5-325 MG tablet Take 1 tablet by mouth every 6 (six) hours as needed. 30 tablet 0   No facility-administered medications prior to visit.    EXAM: ***  Pertinent labs/imaging  Lab Results  Component Value Date   WBC 10.2 08/08/2016   HGB 13.7 08/08/2016   HCT 38.9 08/08/2016   MCV 89.2 08/08/2016   PLT 203 08/08/2016     Chemistry      Component Value Date/Time   NA 136 08/08/2016 1502   K 4.8 08/08/2016 1502   CL 104 08/08/2016 1502   CO2 28 08/08/2016 1502   BUN 12 08/08/2016 1502   CREATININE 0.85 08/08/2016 1502      Component Value Date/Time   CALCIUM 8.8 (L) 08/08/2016 1502   ALKPHOS 72 02/23/2016 1545   AST 14 (L) 02/23/2016 1545   ALT 11 (L) 02/23/2016 1545   BILITOT 0.6 02/23/2016 1545     Lab Results  Component Value Date   ESRSEDRATE 23 (H) 05/17/2015   Lab Results  Component Value Date   DDIMER 0.27 03/19/2016   CT angio  08/08/16: IMPRESSION: 1. Bilateral pulmonary emboli with no evidence of heart strain. 2. Right and left coronary artery calcifications including calcification in the left main coronary artery. 3. Atherosclerotic change in the thoracic aorta. 4. No other acute abnormalities.  Transthoracic echocardiogram 08/09/16: Left ventricle: The cavity size was normal. Wall thickness was   normal. Systolic function was normal. The estimated ejection   fraction was in the range of 55% to 60%. Wall motion was normal;   there were no regional wall motion abnormalities. Doppler   parameters are consistent with abnormal left ventricular   relaxation (grade 1 diastolic dysfunction). Doppler parameters   are consistent with high ventricular filling pressure. - Aortic valve: There was mild regurgitation. - Mitral valve: Calcified annulus. Mildly thickened leaflets .   There was mild regurgitation.  ASSESSMENT/PLAN:  ***  {Medical decision making of moderate complexity was utilized today} 99495  {Medical decision making of high complexity was utilized today} 99496  FOLLOW UP:  ***

## 2016-08-27 ENCOUNTER — Encounter: Payer: Self-pay | Admitting: Family Medicine

## 2016-09-03 ENCOUNTER — Ambulatory Visit: Payer: Medicare Other | Admitting: Family Medicine

## 2016-09-05 ENCOUNTER — Encounter (HOSPITAL_BASED_OUTPATIENT_CLINIC_OR_DEPARTMENT_OTHER): Payer: Self-pay | Admitting: Radiology

## 2016-09-05 ENCOUNTER — Emergency Department (HOSPITAL_BASED_OUTPATIENT_CLINIC_OR_DEPARTMENT_OTHER): Payer: Medicare Other

## 2016-09-05 ENCOUNTER — Encounter: Payer: Self-pay | Admitting: Family Medicine

## 2016-09-05 ENCOUNTER — Emergency Department (HOSPITAL_BASED_OUTPATIENT_CLINIC_OR_DEPARTMENT_OTHER)
Admission: EM | Admit: 2016-09-05 | Discharge: 2016-09-05 | Disposition: A | Discharge status: Home / Self Care | Payer: Medicare Other | Attending: Emergency Medicine | Admitting: Emergency Medicine

## 2016-09-05 ENCOUNTER — Ambulatory Visit (INDEPENDENT_AMBULATORY_CARE_PROVIDER_SITE_OTHER): Payer: Medicare Other | Admitting: Family Medicine

## 2016-09-05 VITALS — BP 154/97 | HR 99 | Temp 98.1°F | Resp 18 | Wt 197.0 lb

## 2016-09-05 DIAGNOSIS — Z7901 Long term (current) use of anticoagulants: Secondary | ICD-10-CM

## 2016-09-05 DIAGNOSIS — Z79899 Other long term (current) drug therapy: Secondary | ICD-10-CM | POA: Insufficient documentation

## 2016-09-05 DIAGNOSIS — K5792 Diverticulitis of intestine, part unspecified, without perforation or abscess without bleeding: Secondary | ICD-10-CM | POA: Diagnosis not present

## 2016-09-05 DIAGNOSIS — F17210 Nicotine dependence, cigarettes, uncomplicated: Secondary | ICD-10-CM | POA: Insufficient documentation

## 2016-09-05 DIAGNOSIS — R1032 Left lower quadrant pain: Secondary | ICD-10-CM

## 2016-09-05 DIAGNOSIS — R197 Diarrhea, unspecified: Secondary | ICD-10-CM | POA: Diagnosis not present

## 2016-09-05 DIAGNOSIS — I82431 Acute embolism and thrombosis of right popliteal vein: Secondary | ICD-10-CM | POA: Diagnosis not present

## 2016-09-05 DIAGNOSIS — I10 Essential (primary) hypertension: Secondary | ICD-10-CM | POA: Insufficient documentation

## 2016-09-05 DIAGNOSIS — R109 Unspecified abdominal pain: Secondary | ICD-10-CM | POA: Diagnosis not present

## 2016-09-05 DIAGNOSIS — I2699 Other pulmonary embolism without acute cor pulmonale: Secondary | ICD-10-CM | POA: Diagnosis not present

## 2016-09-05 LAB — URINALYSIS, ROUTINE W REFLEX MICROSCOPIC
Bilirubin Urine: NEGATIVE
Glucose, UA: NEGATIVE mg/dL
Hgb urine dipstick: NEGATIVE
Ketones, ur: NEGATIVE mg/dL
LEUKOCYTES UA: NEGATIVE
Nitrite: NEGATIVE
PROTEIN: NEGATIVE mg/dL
Specific Gravity, Urine: <1.005 — ABNORMAL LOW (ref 1.005–1.030)
pH: 6 (ref 5.0–8.0)

## 2016-09-05 LAB — CBC
HCT: 41.6 % (ref 36.0–46.0)
Hemoglobin: 14.1 g/dL (ref 12.0–15.0)
MCH: 31.1 pg (ref 26.0–34.0)
MCHC: 33.9 g/dL (ref 30.0–36.0)
MCV: 91.8 fL (ref 78.0–100.0)
PLATELETS: 304 10*3/uL (ref 150–400)
RBC: 4.53 MIL/uL (ref 3.87–5.11)
RDW: 14.1 % (ref 11.5–15.5)
WBC: 11.9 10*3/uL — AB (ref 4.0–10.5)

## 2016-09-05 LAB — COMPREHENSIVE METABOLIC PANEL WITH GFR
ALT: 11 U/L — ABNORMAL LOW (ref 14–54)
AST: 18 U/L (ref 15–41)
Albumin: 3.9 g/dL (ref 3.5–5.0)
Alkaline Phosphatase: 67 U/L (ref 38–126)
Anion gap: 6 (ref 5–15)
BUN: 12 mg/dL (ref 6–20)
CO2: 27 mmol/L (ref 22–32)
Calcium: 9.1 mg/dL (ref 8.9–10.3)
Chloride: 102 mmol/L (ref 101–111)
Creatinine, Ser: 0.68 mg/dL (ref 0.44–1.00)
GFR calc Af Amer: >60 mL/min
GFR calc non Af Amer: >60 mL/min
Glucose, Bld: 100 mg/dL — ABNORMAL HIGH (ref 65–99)
Potassium: 4.7 mmol/L (ref 3.5–5.1)
Sodium: 135 mmol/L (ref 135–145)
Total Bilirubin: 0.1 mg/dL — ABNORMAL LOW (ref 0.3–1.2)
Total Protein: 7.6 g/dL (ref 6.5–8.1)

## 2016-09-05 LAB — CBC WITH DIFFERENTIAL/PLATELET
BASOS ABS: 0 10*3/uL (ref 0.0–0.1)
Basophils Relative: 0.4 % (ref 0.0–3.0)
EOS ABS: 0.1 10*3/uL (ref 0.0–0.7)
Eosinophils Relative: 0.8 % (ref 0.0–5.0)
HCT: 43.4 % (ref 36.0–46.0)
Hemoglobin: 14.7 g/dL (ref 12.0–15.0)
LYMPHS ABS: 2.4 10*3/uL (ref 0.7–4.0)
Lymphocytes Relative: 23 % (ref 12.0–46.0)
MCHC: 33.8 g/dL (ref 30.0–36.0)
MCV: 95.4 fl (ref 78.0–100.0)
MONO ABS: 0.6 10*3/uL (ref 0.1–1.0)
Monocytes Relative: 5.5 % (ref 3.0–12.0)
NEUTROS ABS: 7.3 10*3/uL (ref 1.4–7.7)
NEUTROS PCT: 70.3 % (ref 43.0–77.0)
PLATELETS: 330 10*3/uL (ref 150.0–400.0)
RBC: 4.55 Mil/uL (ref 3.87–5.11)
RDW: 14.7 % (ref 11.5–15.5)
WBC: 10.3 10*3/uL (ref 4.0–10.5)

## 2016-09-05 LAB — PROTIME-INR
INR: 1.3 ratio — AB (ref 0.8–1.0)
Prothrombin Time: 13.5 s — ABNORMAL HIGH (ref 9.6–13.1)

## 2016-09-05 LAB — PREGNANCY, URINE: Preg Test, Ur: NEGATIVE

## 2016-09-05 LAB — LIPASE, BLOOD: Lipase: 19 U/L (ref 11–51)

## 2016-09-05 MED ORDER — METRONIDAZOLE IN NACL 5-0.79 MG/ML-% IV SOLN
500.0000 mg | Freq: Once | INTRAVENOUS | Status: AC
  Administered 2016-09-05: 500 mg via INTRAVENOUS
  Filled 2016-09-05: qty 100

## 2016-09-05 MED ORDER — PROMETHAZINE HCL 12.5 MG PO TABS: ORAL_TABLET | ORAL | 1 refills | Status: DC

## 2016-09-05 MED ORDER — IOPAMIDOL (ISOVUE-300) INJECTION 61%
100.0000 mL | Freq: Once | INTRAVENOUS | Status: AC | PRN
Start: 2016-09-05 — End: 2016-09-05
  Administered 2016-09-05: 100 mL via INTRAVENOUS

## 2016-09-05 MED ORDER — AMOXICILLIN-POT CLAVULANATE 875-125 MG PO TABS
1.0000 | ORAL_TABLET | Freq: Once | ORAL | Status: AC
  Administered 2016-09-05: 1 via ORAL
  Filled 2016-09-05: qty 1

## 2016-09-05 MED ORDER — WARFARIN SODIUM 2.5 MG PO TABS: ORAL_TABLET | ORAL | 0 refills | Status: DC

## 2016-09-05 MED ORDER — AMOXICILLIN-POT CLAVULANATE 875-125 MG PO TABS: 1.0000 | ORAL_TABLET | Freq: Two times a day (BID) | ORAL | 0 refills | Status: DC

## 2016-09-05 MED ORDER — HYDROMORPHONE HCL 1 MG/ML IJ SOLN
1.0000 mg | Freq: Once | INTRAMUSCULAR | Status: AC
  Administered 2016-09-05: 1 mg via INTRAVENOUS
  Filled 2016-09-05: qty 1

## 2016-09-05 MED ORDER — SODIUM CHLORIDE 0.9 % IV BOLUS (SEPSIS)
1000.0000 mL | Freq: Once | INTRAVENOUS | Status: AC
  Administered 2016-09-05: 1000 mL via INTRAVENOUS

## 2016-09-05 MED ORDER — MORPHINE SULFATE (PF) 4 MG/ML IV SOLN
4.0000 mg | Freq: Once | INTRAVENOUS | Status: AC
  Administered 2016-09-05: 4 mg via INTRAVENOUS
  Filled 2016-09-05: qty 1

## 2016-09-05 MED ORDER — ONDANSETRON HCL 4 MG/2ML IJ SOLN
4.0000 mg | Freq: Once | INTRAMUSCULAR | Status: AC
  Administered 2016-09-05: 4 mg via INTRAVENOUS
  Filled 2016-09-05: qty 2

## 2016-09-05 MED ORDER — ENOXAPARIN SODIUM 80 MG/0.8ML ~~LOC~~ SOLN: SUBCUTANEOUS | 1 refills | Status: DC

## 2016-09-05 NOTE — ED Triage Notes (Signed)
C/o abd pain, diarrhea-dx diverticulitis-states she was sent from PCP for IV meds-NAD-steady gait

## 2016-09-05 NOTE — ED Notes (Signed)
Iv attempted x 2 without success. 

## 2016-09-05 NOTE — ED Notes (Signed)
ED Provider at bedside. 

## 2016-09-05 NOTE — Discharge Instructions (Signed)
Please take all of your antibiotics until finished!   You may develop abdominal discomfort or diarrhea from the antibiotic.  You may help offset this with probiotics which you can buy or get in yogurt. Do not eat  or take the probiotics until 2 hours after your antibiotic.   Apply heat pad to the area for comfort. Follow-up with your primary care physician for reevaluation. Return to the ED immediately if any concerning signs or symptoms.

## 2016-09-05 NOTE — Patient Instructions (Signed)
On Monday, 09/09/16---start lovenox injections and coumadin as instructed on the packaging. Make lab appointment for Tuesday, 09/10/16 to start monitoring your coumadin level.

## 2016-09-05 NOTE — ED Provider Notes (Signed)
[facility] Provider Note   CSN: [id number] Arrival date & time: 09/05/16  1059     History   Chief Complaint Chief Complaint  Patient presents with  . Abdominal Pain    HPI LODIE WAHEED is a 51 y.o. female with history of chronic left lower quadrant pain, bilateral PE and DVT of the RLE, HTN, HLD, RSD on chronic pain medication who presents a with chief complaint gradual onset, progressive worsening left lower quadrant pain since yesterday and diarrhea for 2 days. Pain is constant, sharp, worsened with palpation and ambulation. She has had nonbloody, watery loose stools for 2 days, which she states is abnormal for her as she is chronically constipated due to her pain medications. Endorses subjective fevers and chills yesterday and nausea but no vomiting. She was seen and evaluated by her primary care physician Dr. Milinda Cave today who prescribed her high dose Augmentin which she states she cannot fill until tomorrow. She states that she can only tolerate IV Flagyl as oral Flagyl will cause "me to vomit over and over and over again". She requested IV Flagyl in office but was told this was not available. I spoke with Dr. Marvel Plan who states that he did not recommend ED presentation for IV antibiotics. He also does not feel strongly that a CT scan is necessary for rule out of a ruptured diverticulum or abscess as patient has had multiple negative scans in the past and she states that her symptoms are consistent with her usual diverticulitis and she is clinically well-appearing.  The history is provided by the patient.    Past Medical History:  Diagnosis Date  . Abdominal pain, chronic, left lower quadrant    a. s/p Laproscopy in 2004 w/o finding of source of discomfort.  . Anxiety and depression 2013   Lots of stress  . Bilateral pulmonary embolism (HCC) 08/08/2016   with right popliteal vein DVT--hospitalized and d/c'd home on eliquis.  . Chest pain    a. reportedly nl MV and  echo 10/2011 - [place]  . Cholelithiasis    a. s/p cholecystectomy 10/2011  . Colon polyps    a. s/p colonoscopy and polypectomy in 2003  . Diverticulosis    with ? of 'itis  . Family history of colon cancer    mother  . GERD (gastroesophageal reflux disease)   . Hiatal hernia    a. small by EGD 2006.  [name] History of amaurosis fugax 01/2011   L eye; saw neuro at [facility] (Dr. Johnell Comings).  Lab w/u and carotid doppler nl per old records.  . Hyperlipidemia    LDL 191 in 2013 per old records  . Hypertension   . Migraine   . RSD (reflex sympathetic dystrophy)    a. L shoulder--pain mgmt with Dr. Marlynn Perking at [facility] [place]  . Tobacco abuse    a. 50 pack year hx.  . Visual disturbance    a. 12/2011 Left eye visual changes->w/u for TIA->Carotid U/S:  No ICA stenosis; Head CT: No acute abnl; MRI/MRA: mild RPCA stenosis; Echo: EF 55-60%, Triv AI, No PFO.    Patient Active Problem List   Diagnosis Date Noted  . Acute pulmonary embolism (HCC) 08/08/2016  . Acute deep vein thrombosis (DVT) of right popliteal vein (HCC) 08/08/2016  . Chronic pain syndrome 11/24/2015  . Essential hypertension 11/24/2015  . Complex regional pain syndrome I of upper limb 01/16/2013    Past Surgical History:  Procedure Laterality Date  .  CESAREAN SECTION  1990  . COLONOSCOPY  10/2001   Dr. Ewing SchleinMagod; diverticulosis, int/ext hem, one small polyp removed.  . ENDOMETRIAL ABLATION  2011  . ESOPHAGOGASTRODUODENOSCOPY  02/2004   Dr. Ewing SchleinMagod; normal  . LAPAROSCOPIC CHOLECYSTECTOMY  10/19/11  . LAPAROSCOPY  05/2002   Dr. Harmon Dunimothy Davis--no cause for LLQ abd pain found  . SHOULDER SURGERY Left 1997 &98  . TRANSTHORACIC ECHOCARDIOGRAM  08/09/2016   Normal except grd I DD.  [name]. UMBILICAL HERNIA REPAIR  2014   with mesh    OB History    No data available       Home Medications    Prior to Admission medications   Medication Sig Start Date End Date Taking?  Authorizing Provider  albuterol (VENTOLIN HFA) 108 (90 Base) MCG/ACT inhaler Inhale 1-2 puffs into the lungs every 6 (six) hours as needed for wheezing or shortness of breath. Patient not taking: Reported on 09/05/2016 03/14/16   Sharlene DoryWendling, Nicholas Paul, DO  amoxicillin-clavulanate (AUGMENTIN) 875-125 MG tablet Take 1 tablet by mouth 2 (two) times daily. 09/05/16   McGowen, Maryjean MornPhilip H, MD  cloNIDine (CATAPRES) 0.3 MG tablet TAKE ONE TABLET BY MOUTH 3 TIMES DAILY 07/30/16   McGowen, Maryjean MornPhilip H, MD  enoxaparin (LOVENOX) 80 MG/0.8ML injection 80 mg (0.8 ml) SQ q 12 h 09/05/16   McGowen, Maryjean MornPhilip H, MD  HYDROmorphone (DILAUDID) 2 MG tablet Take by mouth 4 (four) times daily as needed.     [provider]  oxyCODONE-acetaminophen (ROXICET) 5-325 MG tablet Take 1 tablet by mouth every 6 (six) hours as needed. 08/09/16   Dimple NanasAmin, Ankit Chirag, MD  promethazine (PHENERGAN) 12.5 MG tablet 1-2 tabs po q6h prn nausea 09/05/16   McGowen, Maryjean MornPhilip H, MD  warfarin (COUMADIN) 2.5 MG tablet 1 tab po qd and adjust as directed by MD 09/05/16   Jeoffrey MassedMcGowen, Philip H, MD    Family History Family History  Problem Relation Age of Onset  . Dementia Father   . Parkinson's disease Father        d. 2013  . Hypertension Father   . Hyperlipidemia Father   . Heart disease Father   . Heart attack Mother        died @ 2867  . Colon cancer Mother   . Hyperlipidemia Mother   . Heart disease Mother   . Hypertension Mother   . Esophageal cancer Brother   . Diabetes Neg Hx     Social History Social History  Substance Use Topics  . Smoking status: Current Every Day Smoker    Packs/day: 0.50    Years: 30.00    Types: Cigarettes  . Smokeless tobacco: Never Used  . Alcohol use No     Allergies   Flagyl [metronidazole]; Elavil [amitriptyline]; Klonopin [clonazepam]; and Levaquin [levofloxacin hemihydrate]   Review of Systems Review of Systems  Constitutional: Positive for chills and fever.  Cardiovascular: Positive for chest  pain (chronic with PE, unchanged).  Gastrointestinal: Positive for abdominal pain, diarrhea and nausea. Negative for blood in stool and vomiting.  Genitourinary: Negative for dysuria and hematuria.  All other systems reviewed and are negative.    Physical Exam Updated Vital Signs BP 119/70 (BP Location: Right Arm)   Pulse 85   Temp 98.2 F (36.8 C) (Oral)   Resp 18   Ht 5\' 3"  (1.6 m)   Wt 88 kg (194 lb)   SpO2 97%   BMI 34.37 kg/m   Physical Exam  Constitutional: She appears well-developed and well-nourished. No  distress.  Resting comfortable in bed  HENT:  Head: Normocephalic and atraumatic.  Eyes: Conjunctivae are normal. Right eye exhibits no discharge. Left eye exhibits no discharge.  Neck: No JVD present. No tracheal deviation present.  Cardiovascular: Normal rate.   Pulmonary/Chest: Effort normal.  Abdominal: Soft. Bowel sounds are normal. She exhibits no distension. There is tenderness.  Left lower quadrant TTP. Murphy sign absent, Rovsing sign absent, and no TTP at McBurney's point, no CVA tenderness  Musculoskeletal: She exhibits no edema.  Neurological: She is alert.  Skin: Skin is warm and dry. No erythema.  Psychiatric: She has a normal mood and affect. Her behavior is normal.  Nursing note and vitals reviewed.    ED Treatments / Results  Labs (all labs ordered are listed, but only abnormal results are displayed) Labs Reviewed  COMPREHENSIVE METABOLIC PANEL - Abnormal; Notable for the following:       Result Value   Glucose, Bld 100 (*)    ALT 11 (*)    Total Bilirubin 0.1 (*)    All other components within normal limits  CBC - Abnormal; Notable for the following:    WBC 11.9 (*)    All other components within normal limits  URINALYSIS, ROUTINE W REFLEX MICROSCOPIC - Abnormal; Notable for the following:    Specific Gravity, Urine <1.005 (*)    All other components within normal limits  LIPASE, BLOOD  PREGNANCY, URINE    EKG  EKG  Interpretation None       Radiology No results found.  Procedures Procedures (including critical care time)  Medications Ordered in ED Medications  amoxicillin-clavulanate (AUGMENTIN) 875-125 MG per tablet 1 tablet (not administered)  ondansetron (ZOFRAN) injection 4 mg (4 mg Intravenous Given 09/05/16 1242)  morphine 4 MG/ML injection 4 mg (4 mg Intravenous Given 09/05/16 1242)  sodium chloride 0.9 % bolus 1,000 mL (1,000 mLs Intravenous New Bag/Given 09/05/16 1242)  metroNIDAZOLE (FLAGYL) IVPB 500 mg (0 mg Intravenous Stopped 09/05/16 1451)  iopamidol (ISOVUE-300) 61 % injection 100 mL (100 mLs Intravenous Contrast Given 09/05/16 1521)  HYDROmorphone (DILAUDID) injection 1 mg (1 mg Intravenous Given 09/05/16 1415)     Initial Impression / Assessment and Plan / ED Course  I have reviewed the triage vital signs and the nursing notes.  Pertinent labs & imaging results that were available during my care of the patient were reviewed by me and considered in my medical decision making (see chart for details).     Patient presents with left lower quadrant abdominal pain similar to prior episodes of diverticulitis in the past. Afebrile, vital signs are stable, she is nontoxic in appearance. Her primary care physician has prescribed Augmentin for 10 days for treatment. She is requesting IV Flagyl. She has a nonspecific leukocytosis of 11.9. Remainder of lab work is reassuring. Doubt TOA, ectopic pregnancy, ovarian torsion, or other intra-abdominal processes. She has had multiple negative CT scans but her symptoms resolve with antibiotics. Pain managed while in the ED and she is tolerating PO. CT scan of the abdomen shows no acute intra-abdominal process, sigmoid diverticulosis without evidence of diverticulitis and aortic atherosclerosis. Repeat abdominal examination is unremarkable, and patient is resting comfortably in bed. She is unable to fill her Augmentin prescription until tomorrow morning.  Due to financial constraints First dose given in the ED. Discussed indications for return to the ED. Pt verbalized understanding of and agreement with plan and is safe for discharge home at this time.  Final Clinical Impressions(s) / ED  Diagnoses   Final diagnoses:  Left lower quadrant pain    New Prescriptions New Prescriptions   No medications on file     Bennye Alm 09/05/16 1650    Tegeler, Canary Brim, MD 09/05/16 [id number]

## 2016-09-05 NOTE — Progress Notes (Signed)
09/05/2016  CC:  Chief Complaint  Patient presents with  . Hospitalization Follow-up    pulmonary emboli   Patient is a 51 y.o. Caucasian female who presents for  hospital follow up. Dates hospitalized: 8/2-8/3, 2018. Days since d/c from hospital: 27 Patient was discharged from hospital to home. Reason for admission to hospital:  Sudden onset SOB and chest pain---dx'd with acute pulmonary embolism and acute DVT in right popliteal vein.  D/c'd home on eliquis.  I have reviewed patient's discharge summary plus pertinent specific notes, labs, and imaging from the hospitalization.   No FH of hypercoagulability, no recent surgery. About 2 wks prior to the hospitalization, she had pneumonia and was immobile/off her feet quite a bit.  Still with constant CP and upper back pain between scapulae, ? Improving gradually.  Has been taking dilauded and percocet from her pain mgmt MD.  About 2 d/a she started having some loose BMs, also her classic LLQ abd pain that she has with her flares of diverticulitis.  No fevers.  Some nausea but no vomiting.  Not eating well last couple days but drinking fluids well. She doesn't have any nausea medication.  Cipro consistently takes care of her diverticulitis flares. No blood in stools, no hematuria, no nosebleeds, no excessive bruising.  Medication reconciliation was done today and patient is taking meds as recommended by discharging hospitalist/specialist.   Her insurer will not cover and DOACs, so she wants to switch over to coumadin.  She still has 3 days of eliquis left to take.  ROS: no palpitations, no focal weakness, no paresthesias, no HA's, no cough or SOB, no leg pain, no LE swelling, no rash, no dysuria or urinary urgency/frequency.  PMH:  Past Medical History:  Diagnosis Date  . Abdominal pain, chronic, left lower quadrant    a. s/p Laproscopy in 2004 w/o finding of source of discomfort.  . Anxiety and depression 2013   Lots of stress  .  Bilateral pulmonary embolism (HCC) 08/08/2016   with right popliteal vein DVT--hospitalized and d/c'd home on eliquis.  . Chest pain    a. reportedly nl MV and echo 10/2011 - [place]  . Cholelithiasis    a. s/p cholecystectomy 10/2011  . Colon polyps    a. s/p colonoscopy and polypectomy in 2003  . Diverticulosis    with ? of 'itis  . Family history of colon cancer    mother  . GERD (gastroesophageal reflux disease)   . Hiatal hernia    a. small by EGD 2006.  [name] History of amaurosis fugax 01/2011   L eye; saw neuro at [facility] (Dr. Johnell Comings).  Lab w/u and carotid doppler nl per old records.  . Hyperlipidemia    LDL 191 in 2013 per old records  . Hypertension   . Migraine   . RSD (reflex sympathetic dystrophy)    a. L shoulder--pain mgmt with Dr. Marlynn Perking at [facility] in [place]  . Tobacco abuse    a. 50 pack year hx.  . Visual disturbance    a. 12/2011 Left eye visual changes->w/u for TIA->Carotid U/S:  No ICA stenosis; Head CT: No acute abnl; MRI/MRA: mild RPCA stenosis; Echo: EF 55-60%, Triv AI, No PFO.    PSH:  Past Surgical History:  Procedure Laterality Date  . CESAREAN SECTION  1990  . COLONOSCOPY  10/2001   Dr. Ewing Schlein; diverticulosis, int/ext hem, one small polyp removed.  . ENDOMETRIAL ABLATION  2011  . ESOPHAGOGASTRODUODENOSCOPY  02/2004  Dr. Ewing Schlein; normal  . LAPAROSCOPIC CHOLECYSTECTOMY  10/19/11  . LAPAROSCOPY  05/2002   Dr. Harmon Dun cause for LLQ abd pain found  . SHOULDER SURGERY Left 1997 &98  . TRANSTHORACIC ECHOCARDIOGRAM  08/09/2016   Normal except grd I DD.  [name] UMBILICAL HERNIA REPAIR  2014   with mesh    MEDS:  Outpatient Medications Prior to Visit  Medication Sig Dispense Refill  . apixaban (ELIQUIS) 5 MG TABS tablet Take 1 tablet (5 mg total) by mouth 2 (two) times daily. Take 10 mg (2 tabs) twice daily for 7 days then  Take 5mg  orally twice daily there after. 90 tablet 0  . cloNIDine  (CATAPRES) 0.3 MG tablet TAKE ONE TABLET BY MOUTH 3 TIMES DAILY 90 tablet 0  . HYDROmorphone (DILAUDID) 2 MG tablet Take by mouth 4 (four) times daily as needed.     [name] oxyCODONE-acetaminophen (ROXICET) 5-325 MG tablet Take 1 tablet by mouth every 6 (six) hours as needed. 30 tablet 0  . albuterol (VENTOLIN HFA) 108 (90 Base) MCG/ACT inhaler Inhale 1-2 puffs into the lungs every 6 (six) hours as needed for wheezing or shortness of breath. (Patient not taking: Reported on 09/05/2016) 1 Inhaler 1   No facility-administered medications prior to visit.   EXAM: BP (!) 154/97 (BP Location: Right Arm, Patient Position: Sitting, Cuff Size: Normal)   Pulse 99   Temp 98.1 F (36.7 C) (Oral)   Resp 18   Wt 197 lb (89.4 kg)   SpO2 98%   BMI 34.90 kg/m  Gen: Alert, well appearing.  Patient is oriented to person, place, time, and situation. AFFECT: pleasant, lucid thought and speech. WUJ:WJXB: no injection, icteris, swelling, or exudate.  EOMI, PERRLA. Mouth: lips without lesion/swelling.  Oral mucosa pink and tacky.  Oropharynx without erythema, exudate, or swelling.  CV: RRR, no m/r/g.   LUNGS: CTA bilat, nonlabored resps, good aeration in all lung fields. ABD: soft, ND, BS normal, no HSM, mass or bruit. She has significant TTP in entire LLQ, worse the further down towards the groin I palpate.  No rebound tenderness.  No involuntary guarding.  Pertinent labs/imaging   Chemistry      Component Value Date/Time   NA 136 08/08/2016 1502   K 4.8 08/08/2016 1502   CL 104 08/08/2016 1502   CO2 28 08/08/2016 1502   BUN 12 08/08/2016 1502   CREATININE 0.85 08/08/2016 1502      Component Value Date/Time   CALCIUM 8.8 (L) 08/08/2016 1502   ALKPHOS 72 02/23/2016 1545   AST 14 (L) 02/23/2016 1545   ALT 11 (L) 02/23/2016 1545   BILITOT 0.6 02/23/2016 1545     Lab Results  Component Value Date   WBC 10.2 08/08/2016   HGB 13.7 08/08/2016   HCT 38.9 08/08/2016   MCV 89.2 08/08/2016   PLT 203  08/08/2016   CT angio chest 08/08/16: IMPRESSION: 1. Bilateral pulmonary emboli with no evidence of heart strain. 2. Right and left coronary artery calcifications including calcification in the left main coronary artery. 3. Atherosclerotic change in the thoracic aorta. 4. No other acute abnormalities. The findings were called to the patient's PA, Lyndel Safe.  LE venous doppler 08/08/16: Summary: - Findings consistent with acute deep vein thrombosis involving the   right popliteal vein. - No evidence of deep vein thrombosis involving the left lower   extremity. - No evidence of Baker&'s cyst on the right or left.  ASSESSMENT/PLAN:  1) Acute DVT with PE--doing  well from respiratory standpoint. No sign of adverse effects from eliquis.  Pt still with CP and upper back pain, but she states that any body pain she has is always exacerbated when she gets her recurrent LLQ pain.  She can't afford any DOACs.  She'll finish her last 3d/worth of eliquis, then change over to coumadin lovenex bridge starting 09/09/16.  Instructions discussed with pt:  On Monday, 09/09/16---start lovenox injections and coumadin as instructed on the packaging. Make lab appointment for Tuesday, 09/10/16 to start monitoring your coumadin level. Lovenox and coumadin have been eRx'd. Start lovenox + coumadin Monday, 09/09/16. Start PT/INR monitoring 09/10/16. Check PT/INR baseline today as well as CBC.  2) Recurrence of pt's acute LLQ pain---this has consistently been dx'd and treated as diverticulitis, despite several cT scans being normal when pt in the midst of a flare.  Pt appears non-toxic. Will treat with augmentin 875mg  bid x 10d.  Pt states she can no longer tolerate flagyl from a GI standpoint, and she states that in the past the cipro alone has not been effective enough. Phenergan rx'd for pt's nausea today.  An After Visit Summary was printed and given to the patient  Spent 40 min with pt today, with >50% of  this time spent in counseling and care coordination regarding the above problems.  FOLLOW UP:  6 weeks f/u DVT/PE  Signed:  Santiago BumpersPhil Cheikh Bramble, MD           09/05/2016

## 2016-09-06 ENCOUNTER — Other Ambulatory Visit: Payer: Medicare Other

## 2016-09-10 ENCOUNTER — Other Ambulatory Visit: Payer: Self-pay | Admitting: Family Medicine

## 2016-09-10 ENCOUNTER — Other Ambulatory Visit: Payer: Medicare Other

## 2016-09-10 ENCOUNTER — Telehealth: Payer: Self-pay | Admitting: Family Medicine

## 2016-09-10 DIAGNOSIS — I2699 Other pulmonary embolism without acute cor pulmonale: Secondary | ICD-10-CM

## 2016-09-10 DIAGNOSIS — Z7901 Long term (current) use of anticoagulants: Secondary | ICD-10-CM

## 2016-09-10 NOTE — Telephone Encounter (Signed)
Not sure what lab work pt needs. Please advise. Thanks.

## 2016-09-10 NOTE — Telephone Encounter (Signed)
Please send blood work orders to [facility] ph [phone] [address], [place]. Patient is having car trouble & can't drive to [facility]

## 2016-09-10 NOTE — Telephone Encounter (Signed)
Lab order faxed to [facility] at [phone].  Patient aware.

## 2016-09-10 NOTE — Telephone Encounter (Signed)
I ordered PT/INR for [facility]---will you print this order and send it to [facility] as per pt request? -thx

## 2016-09-11 ENCOUNTER — Other Ambulatory Visit: Payer: Self-pay | Admitting: Family Medicine

## 2016-09-11 DIAGNOSIS — Z7901 Long term (current) use of anticoagulants: Secondary | ICD-10-CM

## 2016-09-11 DIAGNOSIS — I2699 Other pulmonary embolism without acute cor pulmonale: Secondary | ICD-10-CM

## 2016-09-11 LAB — PROTIME-INR
INR: 1.1 (ref 0.8–1.2)
PROTHROMBIN TIME: 11.3 s (ref 9.1–12.0)

## 2016-09-12 DIAGNOSIS — M25512 Pain in left shoulder: Secondary | ICD-10-CM | POA: Diagnosis not present

## 2016-09-12 DIAGNOSIS — G8929 Other chronic pain: Secondary | ICD-10-CM | POA: Diagnosis not present

## 2016-09-12 DIAGNOSIS — I10 Essential (primary) hypertension: Secondary | ICD-10-CM | POA: Diagnosis not present

## 2016-09-12 DIAGNOSIS — Z79899 Other long term (current) drug therapy: Secondary | ICD-10-CM | POA: Diagnosis not present

## 2016-09-12 DIAGNOSIS — M79602 Pain in left arm: Secondary | ICD-10-CM | POA: Diagnosis not present

## 2016-09-12 NOTE — Addendum Note (Signed)
Addended by: Smitty KnudsenSUTHERLAND, Eugenie Harewood K on: 09/12/2016 07:53 AM   Modules accepted: Orders

## 2016-09-13 DIAGNOSIS — Z7901 Long term (current) use of anticoagulants: Secondary | ICD-10-CM | POA: Diagnosis not present

## 2016-09-13 DIAGNOSIS — Z86711 Personal history of pulmonary embolism: Secondary | ICD-10-CM | POA: Diagnosis not present

## 2016-09-13 DIAGNOSIS — F419 Anxiety disorder, unspecified: Secondary | ICD-10-CM | POA: Diagnosis not present

## 2016-09-13 DIAGNOSIS — Z888 Allergy status to other drugs, medicaments and biological substances status: Secondary | ICD-10-CM | POA: Diagnosis not present

## 2016-09-13 DIAGNOSIS — E785 Hyperlipidemia, unspecified: Secondary | ICD-10-CM | POA: Diagnosis not present

## 2016-09-13 DIAGNOSIS — Z881 Allergy status to other antibiotic agents status: Secondary | ICD-10-CM | POA: Diagnosis not present

## 2016-09-13 DIAGNOSIS — Z79891 Long term (current) use of opiate analgesic: Secondary | ICD-10-CM | POA: Diagnosis not present

## 2016-09-13 DIAGNOSIS — R000 Tachycardia, unspecified: Secondary | ICD-10-CM | POA: Diagnosis not present

## 2016-09-13 DIAGNOSIS — M7989 Other specified soft tissue disorders: Secondary | ICD-10-CM | POA: Diagnosis not present

## 2016-09-13 DIAGNOSIS — F17210 Nicotine dependence, cigarettes, uncomplicated: Secondary | ICD-10-CM | POA: Diagnosis not present

## 2016-09-13 DIAGNOSIS — I10 Essential (primary) hypertension: Secondary | ICD-10-CM | POA: Diagnosis not present

## 2016-09-13 DIAGNOSIS — R0600 Dyspnea, unspecified: Secondary | ICD-10-CM | POA: Diagnosis not present

## 2016-09-13 DIAGNOSIS — Z86718 Personal history of other venous thrombosis and embolism: Secondary | ICD-10-CM | POA: Diagnosis not present

## 2016-09-13 DIAGNOSIS — R9431 Abnormal electrocardiogram [ECG] [EKG]: Secondary | ICD-10-CM | POA: Diagnosis not present

## 2016-09-13 DIAGNOSIS — R079 Chest pain, unspecified: Secondary | ICD-10-CM | POA: Diagnosis not present

## 2016-09-13 DIAGNOSIS — Z79899 Other long term (current) drug therapy: Secondary | ICD-10-CM | POA: Diagnosis not present

## 2016-09-16 DIAGNOSIS — G9050 Complex regional pain syndrome I, unspecified: Secondary | ICD-10-CM | POA: Diagnosis not present

## 2016-09-16 DIAGNOSIS — I10 Essential (primary) hypertension: Secondary | ICD-10-CM | POA: Diagnosis not present

## 2016-09-16 DIAGNOSIS — Z7901 Long term (current) use of anticoagulants: Secondary | ICD-10-CM | POA: Diagnosis not present

## 2016-09-16 DIAGNOSIS — I82409 Acute embolism and thrombosis of unspecified deep veins of unspecified lower extremity: Secondary | ICD-10-CM | POA: Diagnosis not present

## 2016-09-20 DIAGNOSIS — I82409 Acute embolism and thrombosis of unspecified deep veins of unspecified lower extremity: Secondary | ICD-10-CM | POA: Diagnosis not present

## 2016-09-20 DIAGNOSIS — Z7901 Long term (current) use of anticoagulants: Secondary | ICD-10-CM | POA: Diagnosis not present

## 2016-09-23 DIAGNOSIS — Z7901 Long term (current) use of anticoagulants: Secondary | ICD-10-CM | POA: Diagnosis not present

## 2016-09-23 DIAGNOSIS — I82409 Acute embolism and thrombosis of unspecified deep veins of unspecified lower extremity: Secondary | ICD-10-CM | POA: Diagnosis not present

## 2016-09-26 ENCOUNTER — Telehealth: Payer: Self-pay | Admitting: *Deleted

## 2016-09-26 DIAGNOSIS — R110 Nausea: Secondary | ICD-10-CM | POA: Diagnosis not present

## 2016-09-26 DIAGNOSIS — I10 Essential (primary) hypertension: Secondary | ICD-10-CM | POA: Diagnosis not present

## 2016-09-26 DIAGNOSIS — K760 Fatty (change of) liver, not elsewhere classified: Secondary | ICD-10-CM | POA: Diagnosis not present

## 2016-09-26 DIAGNOSIS — Z86718 Personal history of other venous thrombosis and embolism: Secondary | ICD-10-CM | POA: Diagnosis not present

## 2016-09-26 DIAGNOSIS — K5790 Diverticulosis of intestine, part unspecified, without perforation or abscess without bleeding: Secondary | ICD-10-CM | POA: Diagnosis not present

## 2016-09-26 DIAGNOSIS — F17210 Nicotine dependence, cigarettes, uncomplicated: Secondary | ICD-10-CM | POA: Diagnosis not present

## 2016-09-26 DIAGNOSIS — Z86711 Personal history of pulmonary embolism: Secondary | ICD-10-CM | POA: Diagnosis not present

## 2016-09-26 DIAGNOSIS — M79604 Pain in right leg: Secondary | ICD-10-CM | POA: Diagnosis not present

## 2016-09-26 DIAGNOSIS — M7989 Other specified soft tissue disorders: Secondary | ICD-10-CM | POA: Diagnosis not present

## 2016-09-26 DIAGNOSIS — Z888 Allergy status to other drugs, medicaments and biological substances status: Secondary | ICD-10-CM | POA: Diagnosis not present

## 2016-09-26 DIAGNOSIS — E785 Hyperlipidemia, unspecified: Secondary | ICD-10-CM | POA: Diagnosis not present

## 2016-09-26 DIAGNOSIS — Z7901 Long term (current) use of anticoagulants: Secondary | ICD-10-CM | POA: Diagnosis not present

## 2016-09-26 DIAGNOSIS — R791 Abnormal coagulation profile: Secondary | ICD-10-CM | POA: Diagnosis not present

## 2016-09-26 DIAGNOSIS — Z79899 Other long term (current) drug therapy: Secondary | ICD-10-CM | POA: Diagnosis not present

## 2016-09-26 DIAGNOSIS — M79605 Pain in left leg: Secondary | ICD-10-CM | POA: Diagnosis not present

## 2016-09-26 DIAGNOSIS — G9050 Complex regional pain syndrome I, unspecified: Secondary | ICD-10-CM | POA: Diagnosis not present

## 2016-09-26 DIAGNOSIS — K5792 Diverticulitis of intestine, part unspecified, without perforation or abscess without bleeding: Secondary | ICD-10-CM | POA: Diagnosis not present

## 2016-09-26 DIAGNOSIS — R1032 Left lower quadrant pain: Secondary | ICD-10-CM | POA: Diagnosis not present

## 2016-09-26 NOTE — Telephone Encounter (Signed)
Pt is on coumadin and lovenox from a recent PE 08-10-2016- she has complicated hx and needs an OV before having  A direct screening colon due to the PE. Ov needed as PE recent and may not be able to come off blood thinners yet, LM on her VM that she needs to call and make an OV instead of PV. I cancelled her colon and PV that was scheduled.  Dana Morrow PV

## 2016-09-27 ENCOUNTER — Telehealth: Payer: Self-pay | Admitting: *Deleted

## 2016-09-27 NOTE — Telephone Encounter (Signed)
Attempted to reach patient again for appointment regarding blood thinner.

## 2016-09-27 NOTE — Telephone Encounter (Signed)
Phoned patient and left message of need for her to call and schedule office visit with Dr. Russella Dar re: blood thinner. Advised PV and colonoscopy appts were cancelled.

## 2016-09-30 ENCOUNTER — Telehealth: Payer: Self-pay | Admitting: *Deleted

## 2016-09-30 DIAGNOSIS — Z7901 Long term (current) use of anticoagulants: Secondary | ICD-10-CM | POA: Diagnosis not present

## 2016-09-30 DIAGNOSIS — I82409 Acute embolism and thrombosis of unspecified deep veins of unspecified lower extremity: Secondary | ICD-10-CM | POA: Diagnosis not present

## 2016-09-30 NOTE — Telephone Encounter (Signed)
Called patient, no answer. Left message for her to call us back to make office visit with Dr.Stark before colonoscopy can be done.

## 2016-10-01 ENCOUNTER — Telehealth: Payer: Self-pay | Admitting: *Deleted

## 2016-10-01 NOTE — Telephone Encounter (Signed)
Called pt- LM that she needs to cal and schedule an OV before her colonoscopy- Left number to call to RS- Bufford Spikes RN

## 2016-10-02 ENCOUNTER — Encounter: Payer: Self-pay | Admitting: *Deleted

## 2016-10-02 ENCOUNTER — Telehealth: Payer: Self-pay | Admitting: *Deleted

## 2016-10-02 NOTE — Telephone Encounter (Signed)
Attempted Ms Georgi again today x 2 and unsuccessful. LM to call office to make an office visit with APP or Md. Pt is on Plavix and had a recent PE 08-10-16. She will need an OV before she can have a colonoscopy due to her recent complicated medical hx. Mailed a letter to pt for her to call and schedule this office visit, cancelled PV and colon that was scheduled   Hilda Lias PV

## 2016-10-04 DIAGNOSIS — Z7901 Long term (current) use of anticoagulants: Secondary | ICD-10-CM | POA: Diagnosis not present

## 2016-10-04 DIAGNOSIS — I82409 Acute embolism and thrombosis of unspecified deep veins of unspecified lower extremity: Secondary | ICD-10-CM | POA: Diagnosis not present

## 2016-10-07 DIAGNOSIS — G90512 Complex regional pain syndrome I of left upper limb: Secondary | ICD-10-CM | POA: Diagnosis not present

## 2016-10-07 DIAGNOSIS — M47816 Spondylosis without myelopathy or radiculopathy, lumbar region: Secondary | ICD-10-CM | POA: Diagnosis not present

## 2016-10-07 DIAGNOSIS — G894 Chronic pain syndrome: Secondary | ICD-10-CM | POA: Diagnosis not present

## 2016-10-10 ENCOUNTER — Ambulatory Visit: Payer: Medicare Other | Admitting: Family Medicine

## 2016-10-10 DIAGNOSIS — Z0289 Encounter for other administrative examinations: Secondary | ICD-10-CM

## 2016-10-10 NOTE — Progress Notes (Deleted)
OFFICE VISIT  10/10/2016   CC: No chief complaint on file.  HPI:    Patient is a 51 y.o. Caucasian female who presents for 5 week f/u acute PE with right popliteal vein DVT--eliquis treatment. At last f/u 09/05/16 we made plans to do lovenox bridge to coumadin due to cost of eliquis.  Past Medical History:  Diagnosis Date  . Abdominal pain, chronic, left lower quadrant    a. s/p Laproscopy in 2004 w/o finding of source of discomfort.  . Anxiety and depression 2013   Lots of stress  . Bilateral pulmonary embolism (HCC) 08/08/2016   with right popliteal vein DVT--hospitalized and d/c'd home on eliquis.  . Chest pain    a. reportedly nl MV and echo 10/2011 - [place]  . Cholelithiasis    a. s/p cholecystectomy 10/2011  . Colon polyps    a. s/p colonoscopy and polypectomy in 2003  . Diverticulosis    with ? of 'itis  . Family history of colon cancer    mother  . GERD (gastroesophageal reflux disease)   . Hiatal hernia    a. small by EGD 2006.  [name] History of amaurosis fugax 01/2011   L eye; saw neuro at [facility] (Dr. Johnell Comings).  Lab w/u and carotid doppler nl per old records.  . Hyperlipidemia    LDL 191 in 2013 per old records  . Hypertension   . Migraine   . RSD (reflex sympathetic dystrophy)    a. L shoulder--pain mgmt with Dr. Marlynn Perking at [facility] [place]  . Tobacco abuse    a. 50 pack year hx.  . Visual disturbance    a. 12/2011 Left eye visual changes->w/u for TIA->Carotid U/S:  No ICA stenosis; Head CT: No acute abnl; MRI/MRA: mild RPCA stenosis; Echo: EF 55-60%, Triv AI, No PFO.    Past Surgical History:  Procedure Laterality Date  . CESAREAN SECTION  1990  . COLONOSCOPY  10/2001   Dr. Ewing Schlein; diverticulosis, int/ext hem, one small polyp removed.  . ENDOMETRIAL ABLATION  2011  . ESOPHAGOGASTRODUODENOSCOPY  02/2004   Dr. Ewing Schlein; normal  . LAPAROSCOPIC CHOLECYSTECTOMY  10/19/11  . LAPAROSCOPY  05/2002   Dr. Harmon Dun cause for LLQ abd pain found  . SHOULDER SURGERY Left 1997 &98  . TRANSTHORACIC ECHOCARDIOGRAM  08/09/2016   Normal except grd I DD.  [name] UMBILICAL HERNIA REPAIR  2014   with mesh    Outpatient Medications Prior to Visit  Medication Sig Dispense Refill  . albuterol (VENTOLIN HFA) 108 (90 Base) MCG/ACT inhaler Inhale 1-2 puffs into the lungs every 6 (six) hours as needed for wheezing or shortness of breath. (Patient not taking: Reported on 09/05/2016) 1 Inhaler 1  . amoxicillin-clavulanate (AUGMENTIN) 875-125 MG tablet Take 1 tablet by mouth 2 (two) times daily. 20 tablet 0  . cloNIDine (CATAPRES) 0.3 MG tablet TAKE ONE TABLET BY MOUTH 3 TIMES DAILY 90 tablet 0  . enoxaparin (LOVENOX) 80 MG/0.8ML injection 80 mg (0.8 ml) SQ q 12 h 10 Syringe 1  . HYDROmorphone (DILAUDID) 2 MG tablet Take by mouth 4 (four) times daily as needed.     [name] oxyCODONE-acetaminophen (ROXICET) 5-325 MG tablet Take 1 tablet by mouth every 6 (six) hours as needed. 30 tablet 0  . promethazine (PHENERGAN) 12.5 MG tablet 1-2 tabs po q6h prn nausea 30 tablet 1  . warfarin (COUMADIN) 2.5 MG tablet 1 tab po qd and adjust as directed by MD 30 tablet 0  No facility-administered medications prior to visit.     Allergies  Allergen Reactions  . Flagyl [Metronidazole] Nausea And Vomiting    Oral form only, IV pt can tolerate  . Elavil [Amitriptyline]     hallucinations  . Klonopin [Clonazepam]     Hallucinations   . Levaquin [Levofloxacin Hemihydrate] Hives    ROS As per HPI  PE: There were no vitals taken for this visit. ***  LABS:  Lab Results  Component Value Date   INR 1.1 09/10/2016   INR 1.3 (H) 09/05/2016   INR 0.94 12/18/2011    No results found for: TSH Lab Results  Component Value Date   WBC 11.9 (H) 09/05/2016   HGB 14.1 09/05/2016   HCT 41.6 09/05/2016   MCV 91.8 09/05/2016   PLT 304 09/05/2016   Lab Results  Component Value Date   CREATININE 0.68 09/05/2016   BUN 12 09/05/2016    NA 135 09/05/2016   K 4.7 09/05/2016   CL 102 09/05/2016   CO2 27 09/05/2016   Lab Results  Component Value Date   ALT 11 (L) 09/05/2016   AST 18 09/05/2016   ALKPHOS 67 09/05/2016   BILITOT 0.1 (L) 09/05/2016   Lab Results  Component Value Date   CHOL 228 (H) 12/18/2011   Lab Results  Component Value Date   HDL 40 12/18/2011   Lab Results  Component Value Date   LDLCALC 150 (H) 12/18/2011   Lab Results  Component Value Date   TRIG 190 (H) 12/18/2011   Lab Results  Component Value Date   CHOLHDL 5.7 12/18/2011   Lab Results  Component Value Date   HGBA1C 5.7 (H) 12/18/2011   IMPRESSION AND PLAN:  No problem-specific Assessment & Plan notes found for this encounter.   FOLLOW UP: No Follow-up on file.

## 2016-10-14 DIAGNOSIS — I10 Essential (primary) hypertension: Secondary | ICD-10-CM | POA: Diagnosis not present

## 2016-10-14 DIAGNOSIS — M25512 Pain in left shoulder: Secondary | ICD-10-CM | POA: Diagnosis not present

## 2016-10-14 DIAGNOSIS — Z79899 Other long term (current) drug therapy: Secondary | ICD-10-CM | POA: Diagnosis not present

## 2016-10-14 DIAGNOSIS — G9050 Complex regional pain syndrome I, unspecified: Secondary | ICD-10-CM | POA: Diagnosis not present

## 2016-10-14 DIAGNOSIS — I82409 Acute embolism and thrombosis of unspecified deep veins of unspecified lower extremity: Secondary | ICD-10-CM | POA: Diagnosis not present

## 2016-10-14 DIAGNOSIS — Z7901 Long term (current) use of anticoagulants: Secondary | ICD-10-CM | POA: Diagnosis not present

## 2016-10-21 ENCOUNTER — Encounter: Payer: Medicare Other | Admitting: Gastroenterology

## 2016-10-21 DIAGNOSIS — I82409 Acute embolism and thrombosis of unspecified deep veins of unspecified lower extremity: Secondary | ICD-10-CM | POA: Diagnosis not present

## 2016-10-21 DIAGNOSIS — Z7901 Long term (current) use of anticoagulants: Secondary | ICD-10-CM | POA: Diagnosis not present

## 2016-10-23 ENCOUNTER — Emergency Department (HOSPITAL_BASED_OUTPATIENT_CLINIC_OR_DEPARTMENT_OTHER): Payer: Medicare Other

## 2016-10-23 ENCOUNTER — Encounter (HOSPITAL_BASED_OUTPATIENT_CLINIC_OR_DEPARTMENT_OTHER): Payer: Self-pay | Admitting: *Deleted

## 2016-10-23 ENCOUNTER — Observation Stay (HOSPITAL_BASED_OUTPATIENT_CLINIC_OR_DEPARTMENT_OTHER)
Admission: EM | Admit: 2016-10-23 | Discharge: 2016-10-24 | Disposition: A | Discharge status: Home / Self Care | Payer: Medicare Other | Attending: Family Medicine | Admitting: Family Medicine

## 2016-10-23 DIAGNOSIS — Z720 Tobacco use: Secondary | ICD-10-CM | POA: Diagnosis not present

## 2016-10-23 DIAGNOSIS — F17210 Nicotine dependence, cigarettes, uncomplicated: Secondary | ICD-10-CM | POA: Diagnosis not present

## 2016-10-23 DIAGNOSIS — D72829 Elevated white blood cell count, unspecified: Secondary | ICD-10-CM | POA: Diagnosis not present

## 2016-10-23 DIAGNOSIS — E860 Dehydration: Secondary | ICD-10-CM

## 2016-10-23 DIAGNOSIS — G894 Chronic pain syndrome: Secondary | ICD-10-CM | POA: Insufficient documentation

## 2016-10-23 DIAGNOSIS — Z8249 Family history of ischemic heart disease and other diseases of the circulatory system: Secondary | ICD-10-CM | POA: Diagnosis not present

## 2016-10-23 DIAGNOSIS — Z7901 Long term (current) use of anticoagulants: Secondary | ICD-10-CM | POA: Diagnosis not present

## 2016-10-23 DIAGNOSIS — Z86711 Personal history of pulmonary embolism: Secondary | ICD-10-CM | POA: Insufficient documentation

## 2016-10-23 DIAGNOSIS — R0781 Pleurodynia: Secondary | ICD-10-CM | POA: Diagnosis present

## 2016-10-23 DIAGNOSIS — Z86718 Personal history of other venous thrombosis and embolism: Secondary | ICD-10-CM | POA: Diagnosis not present

## 2016-10-23 DIAGNOSIS — R0789 Other chest pain: Principal | ICD-10-CM | POA: Insufficient documentation

## 2016-10-23 DIAGNOSIS — M79605 Pain in left leg: Secondary | ICD-10-CM | POA: Diagnosis not present

## 2016-10-23 DIAGNOSIS — I10 Essential (primary) hypertension: Secondary | ICD-10-CM | POA: Diagnosis not present

## 2016-10-23 DIAGNOSIS — R000 Tachycardia, unspecified: Secondary | ICD-10-CM | POA: Diagnosis not present

## 2016-10-23 DIAGNOSIS — R079 Chest pain, unspecified: Secondary | ICD-10-CM | POA: Diagnosis not present

## 2016-10-23 DIAGNOSIS — D72825 Bandemia: Secondary | ICD-10-CM | POA: Diagnosis not present

## 2016-10-23 DIAGNOSIS — R072 Precordial pain: Secondary | ICD-10-CM | POA: Diagnosis not present

## 2016-10-23 DIAGNOSIS — D751 Secondary polycythemia: Secondary | ICD-10-CM | POA: Diagnosis not present

## 2016-10-23 HISTORY — DX: Chronic pain syndrome: G89.4

## 2016-10-23 LAB — CBC WITH DIFFERENTIAL/PLATELET
Basophils Absolute: 0.2 10*3/uL — ABNORMAL HIGH (ref 0.0–0.1)
Basophils Relative: 1 %
EOS ABS: 0 10*3/uL (ref 0.0–0.7)
Eosinophils Relative: 0 %
HCT: 43.2 % (ref 36.0–46.0)
Hemoglobin: 15.2 g/dL — ABNORMAL HIGH (ref 12.0–15.0)
LYMPHS PCT: 17 %
Lymphs Abs: 3.2 10*3/uL (ref 0.7–4.0)
MCH: 32 pg (ref 26.0–34.0)
MCHC: 35.2 g/dL (ref 30.0–36.0)
MCV: 90.9 fL (ref 78.0–100.0)
MONO ABS: 0.8 10*3/uL (ref 0.1–1.0)
Monocytes Relative: 4 %
NEUTROS PCT: 78 %
Neutro Abs: 14.8 10*3/uL — ABNORMAL HIGH (ref 1.7–7.7)
PLATELETS: 284 10*3/uL (ref 150–400)
RBC: 4.75 MIL/uL (ref 3.87–5.11)
RDW: 13.8 % (ref 11.5–15.5)
WBC: 19 10*3/uL — ABNORMAL HIGH (ref 4.0–10.5)

## 2016-10-23 LAB — COMPREHENSIVE METABOLIC PANEL
ALBUMIN: 3.9 g/dL (ref 3.5–5.0)
ALT: 15 U/L (ref 14–54)
AST: 19 U/L (ref 15–41)
Alkaline Phosphatase: 74 U/L (ref 38–126)
Anion gap: 9 (ref 5–15)
BUN: 16 mg/dL (ref 6–20)
CHLORIDE: 102 mmol/L (ref 101–111)
CO2: 24 mmol/L (ref 22–32)
CREATININE: 0.81 mg/dL (ref 0.44–1.00)
Calcium: 9.1 mg/dL (ref 8.9–10.3)
GFR calc Af Amer: >60 mL/min (ref >60)
GFR calc non Af Amer: >60 mL/min (ref >60)
GLUCOSE: 186 mg/dL — AB (ref 65–99)
Potassium: 3.5 mmol/L (ref 3.5–5.1)
SODIUM: 135 mmol/L (ref 135–145)
Total Bilirubin: 0.5 mg/dL (ref 0.3–1.2)
Total Protein: 7.9 g/dL (ref 6.5–8.1)

## 2016-10-23 LAB — TROPONIN I: Troponin I: <0.03 ng/mL (ref <0.03)

## 2016-10-23 LAB — BRAIN NATRIURETIC PEPTIDE: B NATRIURETIC PEPTIDE 5: 121.9 pg/mL — AB (ref 0.0–100.0)

## 2016-10-23 LAB — PROTIME-INR
INR: 1.34
Prothrombin Time: 16.5 seconds — ABNORMAL HIGH (ref 11.4–15.2)

## 2016-10-23 MED ORDER — HYDROMORPHONE HCL 1 MG/ML IJ SOLN
1.0000 mg | Freq: Once | INTRAMUSCULAR | Status: AC
  Administered 2016-10-23: 1 mg via INTRAVENOUS
  Filled 2016-10-23: qty 1

## 2016-10-23 MED ORDER — WARFARIN - PHARMACIST DOSING INPATIENT
Freq: Every day | Status: DC
Start: 2016-10-24 — End: 2016-10-24

## 2016-10-23 MED ORDER — OXYCODONE-ACETAMINOPHEN 5-325 MG PO TABS
1.0000 | ORAL_TABLET | Freq: Four times a day (QID) | ORAL | Status: DC | PRN
  Administered 2016-10-24 (×2): 1 via ORAL
  Filled 2016-10-23 (×2): qty 1

## 2016-10-23 MED ORDER — ALBUTEROL SULFATE (2.5 MG/3ML) 0.083% IN NEBU
3.0000 mL | INHALATION_SOLUTION | Freq: Four times a day (QID) | RESPIRATORY_TRACT | Status: DC | PRN
Start: 2016-10-23 — End: 2016-10-24

## 2016-10-23 MED ORDER — CLONIDINE HCL 0.2 MG PO TABS
0.3000 mg | ORAL_TABLET | Freq: Three times a day (TID) | ORAL | Status: DC
  Administered 2016-10-24 (×2): 0.3 mg via ORAL
  Filled 2016-10-23 (×2): qty 1

## 2016-10-23 MED ORDER — WARFARIN SODIUM 2.5 MG PO TABS
2.5000 mg | ORAL_TABLET | Freq: Once | ORAL | Status: AC
  Administered 2016-10-24: 2.5 mg via ORAL
  Filled 2016-10-23: qty 1

## 2016-10-23 MED ORDER — IOPAMIDOL (ISOVUE-370) INJECTION 76%
100.0000 mL | Freq: Once | INTRAVENOUS | Status: AC | PRN
Start: 2016-10-23 — End: 2016-10-23
  Administered 2016-10-23: 100 mL via INTRAVENOUS

## 2016-10-23 MED ORDER — ACETAMINOPHEN 325 MG PO TABS: 650.0000 mg | ORAL_TABLET | ORAL | Status: DC | PRN

## 2016-10-23 MED ORDER — ONDANSETRON HCL 4 MG/2ML IJ SOLN
4.0000 mg | Freq: Once | INTRAMUSCULAR | Status: AC
  Administered 2016-10-23: 4 mg via INTRAVENOUS
  Filled 2016-10-23: qty 2

## 2016-10-23 MED ORDER — ASPIRIN EC 325 MG PO TBEC
325.0000 mg | DELAYED_RELEASE_TABLET | Freq: Once | ORAL | Status: AC
  Administered 2016-10-23: 325 mg via ORAL
  Filled 2016-10-23: qty 1

## 2016-10-23 MED ORDER — HYDROMORPHONE HCL 2 MG PO TABS
2.0000 mg | ORAL_TABLET | Freq: Four times a day (QID) | ORAL | Status: DC | PRN
  Administered 2016-10-24: 2 mg via ORAL
  Filled 2016-10-23: qty 1

## 2016-10-23 MED ORDER — GI COCKTAIL ~~LOC~~
30.0000 mL | Freq: Once | ORAL | Status: AC
  Administered 2016-10-23: 30 mL via ORAL
  Filled 2016-10-23: qty 30

## 2016-10-23 MED ORDER — NITROGLYCERIN 0.4 MG SL SUBL
0.4000 mg | SUBLINGUAL_TABLET | Freq: Once | SUBLINGUAL | Status: AC
  Administered 2016-10-23: 0.4 mg via SUBLINGUAL
  Filled 2016-10-23: qty 1

## 2016-10-23 MED ORDER — ENOXAPARIN SODIUM 100 MG/ML ~~LOC~~ SOLN
90.0000 mg | Freq: Two times a day (BID) | SUBCUTANEOUS | Status: DC
  Administered 2016-10-24 (×2): 90 mg via SUBCUTANEOUS
  Filled 2016-10-23 (×2): qty 1

## 2016-10-23 MED ORDER — ONDANSETRON HCL 4 MG/2ML IJ SOLN
4.0000 mg | Freq: Four times a day (QID) | INTRAMUSCULAR | Status: DC | PRN
  Administered 2016-10-24 (×2): 4 mg via INTRAVENOUS
  Filled 2016-10-23 (×2): qty 2

## 2016-10-23 MED ORDER — MORPHINE SULFATE (PF) 4 MG/ML IV SOLN
4.0000 mg | Freq: Once | INTRAVENOUS | Status: AC
  Administered 2016-10-23: 4 mg via INTRAVENOUS
  Filled 2016-10-23: qty 1

## 2016-10-23 NOTE — ED Notes (Signed)
ED Provider at bedside. 

## 2016-10-23 NOTE — Progress Notes (Signed)
[facility] [place]. Notified the [facility] MD assignment to find a MD for this patient.

## 2016-10-23 NOTE — ED Notes (Signed)
Patient transported to X-ray 

## 2016-10-23 NOTE — ED Notes (Signed)
Pt states she will have to find a ride d/t narc otic order

## 2016-10-23 NOTE — ED Notes (Signed)
Patient transported to Ultrasound 

## 2016-10-23 NOTE — ED Notes (Signed)
[facility] [phone] at 9:30 pm

## 2016-10-23 NOTE — ED Triage Notes (Signed)
Left sided CP radiating 'down my legs and up my arm' x 2-3 days. Reports nausea. Denies SOB.

## 2016-10-23 NOTE — ED Notes (Signed)
Patient transported to CT 

## 2016-10-23 NOTE — ED Provider Notes (Signed)
[facility] EMERGENCY DEPARTMENT Provider Note   CSN: [id number] Arrival date & time: 10/23/16  1453     History   Chief Complaint Chief Complaint  Patient presents with  . Chest Pain    HPI Dana Morrow is a 51 y.o. female.  The history is provided by the patient.  Chest Pain   This is a new problem. The current episode started yesterday. The problem occurs constantly. The problem has not changed since onset.The pain is present in the substernal region and lateral region. The pain is at a severity of 10/10. The pain is moderate. The quality of the pain is described as pressure-like. The pain radiates to the left arm. Associated symptoms include leg pain. Pertinent negatives include no diaphoresis, no lower extremity edema, no numbness, no shortness of breath and no weakness. Treatments tried: home pain medications. The treatment provided no relief.  Her past medical history is significant for DVT.  Her family medical history is significant for CAD.   51 year old female who presents with chest pain. She has a history of DVT and bilateral PEs that were diagnosed in August. She was recently changed from L Oquist to Lovenox and Coumadin and recently just Coumadin alone. States that she's had a subtherapeutic INR over the past 1-2 weeks. Also history of HTN, HLD, family history CAD, an tobacco use. Onset of intermittent chest pressure, left chest radiating to left arm intermittently over past 2 days. This morning pain has been constant. Associated with pain in the LLE as well, similar to when diagnosed with right lower extremity dvt in 08/2016. Denies syncope, near syncope, dyspnea, orthopnea, PND, leg swelling. No fever, cough, abdominal pain. Episode of vomiting earlier today. No diarrhea. Took home pain medications for chronic pain, with brief relief.   Past Medical History:  Diagnosis Date  . Abdominal pain, chronic, left lower quadrant    a. s/p Laproscopy in 2004 w/o  finding of source of discomfort.  . Anxiety and depression 2013   Lots of stress  . Bilateral pulmonary embolism (HCC) 08/08/2016   with right popliteal vein DVT--hospitalized and d/c'd home on eliquis.  . Chest pain    a. reportedly nl MV and echo 10/2011 - [place]  . Cholelithiasis    a. s/p cholecystectomy 10/2011  . Chronic pain disorder   . Colon polyps    a. s/p colonoscopy and polypectomy in 2003  . Diverticulosis    with ? of 'itis  . Family history of colon cancer    mother  . GERD (gastroesophageal reflux disease)   . Hiatal hernia    a. small by EGD 2006.  [name] History of amaurosis fugax 01/2011   L eye; saw neuro at [facility] (Dr. Johnell Comings).  Lab w/u and carotid doppler nl per old records.  . Hyperlipidemia    LDL 191 in 2013 per old records  . Hypertension   . Migraine   . RSD (reflex sympathetic dystrophy)    a. L shoulder--pain mgmt with Dr. Marlynn Perking at [facility] in [place]  . Tobacco abuse    a. 50 pack year hx.  . Visual disturbance    a. 12/2011 Left eye visual changes->w/u for TIA->Carotid U/S:  No ICA stenosis; Head CT: No acute abnl; MRI/MRA: mild RPCA stenosis; Echo: EF 55-60%, Triv AI, No PFO.    Patient Active Problem List   Diagnosis Date Noted  . Acute pulmonary embolism (HCC) 08/08/2016  . Acute deep vein thrombosis (  DVT) of right popliteal vein (HCC) 08/08/2016  . Chronic pain syndrome 11/24/2015  . Essential hypertension 11/24/2015  . Complex regional pain syndrome I of upper limb 01/16/2013    Past Surgical History:  Procedure Laterality Date  . CESAREAN SECTION  1990  . COLONOSCOPY  10/2001   Dr. Ewing Schlein; diverticulosis, int/ext hem, one small polyp removed.  . ENDOMETRIAL ABLATION  2011  . ESOPHAGOGASTRODUODENOSCOPY  02/2004   Dr. Ewing Schlein; normal  . LAPAROSCOPIC CHOLECYSTECTOMY  10/19/11  . LAPAROSCOPY  05/2002   Dr. Harmon Dun cause for LLQ abd pain found  . SHOULDER SURGERY Left 1997  &98  . TRANSTHORACIC ECHOCARDIOGRAM  08/09/2016   Normal except grd I DD.  [name] UMBILICAL HERNIA REPAIR  2014   with mesh    OB History    No data available       Home Medications    Prior to Admission medications   Medication Sig Start Date End Date Taking? Authorizing Provider  albuterol (VENTOLIN HFA) 108 (90 Base) MCG/ACT inhaler Inhale 1-2 puffs into the lungs every 6 (six) hours as needed for wheezing or shortness of breath. Patient not taking: Reported on 09/05/2016 03/14/16   Sharlene Dory, DO  amoxicillin-clavulanate (AUGMENTIN) 875-125 MG tablet Take 1 tablet by mouth 2 (two) times daily. 09/05/16   McGowen, Maryjean Morn, MD  cloNIDine (CATAPRES) 0.3 MG tablet TAKE ONE TABLET BY MOUTH 3 TIMES DAILY 07/30/16   McGowen, Maryjean Morn, MD  HYDROmorphone (DILAUDID) 2 MG tablet Take by mouth 4 (four) times daily as needed.     [provider]  oxyCODONE-acetaminophen (ROXICET) 5-325 MG tablet Take 1 tablet by mouth every 6 (six) hours as needed. 08/09/16   Amin, Loura Halt, MD  promethazine (PHENERGAN) 12.5 MG tablet 1-2 tabs po q6h prn nausea 09/05/16   McGowen, Maryjean Morn, MD  warfarin (COUMADIN) 2.5 MG tablet 1 tab po qd and adjust as directed by MD Patient taking differently: 7.5 mg. 1 tab po qd and adjust as directed by MD 09/05/16   Jeoffrey Massed, MD    Family History Family History  Problem Relation Age of Onset  . Dementia Father   . Parkinson's disease Father        d. 2013  . Hypertension Father   . Hyperlipidemia Father   . Heart disease Father   . Heart attack Mother        died @ 10  . Colon cancer Mother   . Hyperlipidemia Mother   . Heart disease Mother   . Hypertension Mother   . Esophageal cancer Brother   . Diabetes Neg Hx     Social History Social History  Substance Use Topics  . Smoking status: Current Every Day Smoker    Packs/day: 0.50    Years: 30.00    Types: Cigarettes  . Smokeless tobacco: Never Used  . Alcohol use No      Allergies   Flagyl [metronidazole]; Elavil [amitriptyline]; Klonopin [clonazepam]; and Levaquin [levofloxacin hemihydrate]   Review of Systems Review of Systems  Constitutional: Negative for diaphoresis.  Respiratory: Negative for shortness of breath.   Cardiovascular: Positive for chest pain.  Neurological: Negative for weakness, light-headedness and numbness.  All other systems reviewed and are negative.    Physical Exam Updated Vital Signs BP (!) 159/90   Pulse 85   Temp 98.9 F (37.2 C) (Oral)   Resp 18   Ht 5\' 3"  (1.6 m)   Wt 82.6 kg (182 lb)  SpO2 96%   BMI 32.24 kg/m   Physical Exam Physical Exam  Nursing note and vitals reviewed. Constitutional: anxious appearing, non-toxic, and in no acute distress Head: Normocephalic and atraumatic.  Mouth/Throat: Oropharynx is clear and moist.  Neck: Normal range of motion. Neck supple.  Cardiovascular: "Tachcyardic rate and regular rhythm.   Pulmonary/Chest: Effort normal and breath sounds normal.  Abdominal: Soft. There is no tenderness. There is no rebound and no guarding.  Musculoskeletal: Normal range of motion.  Neurological: Alert, no facial droop, fluent speech, moves all extremities symmetrically Skin: Skin is warm and dry.  Psychiatric: Cooperative   ED Treatments / Results  Labs (all labs ordered are listed, but only abnormal results are displayed) Labs Reviewed  CBC WITH DIFFERENTIAL/PLATELET - Abnormal; Notable for the following:       Result Value   WBC 19.0 (*)    Hemoglobin 15.2 (*)    Neutro Abs 14.8 (*)    Basophils Absolute 0.2 (*)    All other components within normal limits  COMPREHENSIVE METABOLIC PANEL - Abnormal; Notable for the following:    Glucose, Bld 186 (*)    All other components within normal limits  BRAIN NATRIURETIC PEPTIDE - Abnormal; Notable for the following:    B Natriuretic Peptide 121.9 (*)    All other components within normal limits  PROTIME-INR - Abnormal;  Notable for the following:    Prothrombin Time 16.5 (*)    All other components within normal limits  TROPONIN I  TROPONIN I    EKG  EKG Interpretation  Date/Time:  Wednesday October 23 2016 15:01:53 EDT Ventricular Rate:  112 PR Interval:    QRS Duration: 87 QT Interval:  334 QTC Calculation: 456 R Axis:   51 Text Interpretation:  Sinus tachycardia Probable left atrial enlargement other than tachycardia similar to previous EKg  Confirmed by Crista Curb [phone]) on 10/23/2016 3:26:47 PM       Radiology Dg Chest 2 View  Result Date: 10/23/2016 CLINICAL DATA:  Left-sided chest pain EXAM: CHEST  2 VIEW COMPARISON:  08/08/2016 FINDINGS: The heart size and mediastinal contours are within normal limits. Both lungs are clear. The visualized skeletal structures are unremarkable. IMPRESSION: No active cardiopulmonary disease. Electronically Signed   By: Alcide Clever M.D.   On: 10/23/2016 15:50   Ct Angio Chest Pe W And/or Wo Contrast  Result Date: 10/23/2016 CLINICAL DATA:  Left-sided chest pain EXAM: CT ANGIOGRAPHY CHEST WITH CONTRAST TECHNIQUE: Multidetector CT imaging of the chest was performed using the standard protocol during bolus administration of intravenous contrast. Multiplanar CT image reconstructions and MIPs were obtained to evaluate the vascular anatomy. CONTRAST:  175 mL Isovue 370. There was an initial extravasation at the beginning of the procedure with approximately 75 mL of contrast extravasated into the right upper arm. Patient was counseled with regards to cool compresses as well as monitoring the site for any potential skin changes although based on the initial scout image, bruising and muscle soreness is likely more common. COMPARISON:  08/08/2016 FINDINGS: Cardiovascular: Thoracic aorta demonstrates mild atherosclerotic change without aneurysmal dilatation or dissection. No significant cardiac enlargement is noted. Minimal coronary calcifications are seen. Pulmonary artery  shows a normal branching pattern. No filling defects are identified to suggest pulmonary embolism. Mediastinum/Nodes: Thoracic inlet is within normal limits. The esophagus is within normal limits. A few small right peritracheal and right hilar lymph nodes are noted stable from previous exams dating back to 2017 and likely benign in etiology. Lungs/Pleura:  Lungs are well aerated bilaterally. No focal infiltrate or sizable effusion is seen. Mild patchy atelectatic changes are noted in the left lung without focal confluent infiltrate. No sizable effusion is noted. Upper Abdomen: Changes of prior cholecystectomy are noted. Mild fullness of biliary tree is noted consistent with the post cholecystectomy state. No other focal abnormality is noted. Musculoskeletal: No acute bony abnormality is noted. Review of the MIP images confirms the above findings. IMPRESSION: No evidence of acute pulmonary emboli. The filling defects seen on the prior exam have resolved in the interval. No acute abnormality identified. Electronically Signed   By: Alcide Clever M.D.   On: 10/23/2016 18:26   US Venous Img Lower Unilateral Left  Result Date: 10/23/2016 CLINICAL DATA:  Left leg pain for several hours, history of deep venous thrombosis on the right with current blood thinners EXAM: LEFT LOWER EXTREMITY VENOUS DOPPLER ULTRASOUND TECHNIQUE: Gray-scale sonography with graded compression, as well as color Doppler and duplex ultrasound were performed to evaluate the lower extremity deep venous systems from the level of the common femoral vein and including the common femoral, femoral, profunda femoral, popliteal and calf veins including the posterior tibial, peroneal and gastrocnemius veins when visible. The superficial great saphenous vein was also interrogated. Spectral Doppler was utilized to evaluate flow at rest and with distal augmentation maneuvers in the common femoral, femoral and popliteal veins. COMPARISON:  08/08/2016 FINDINGS:  Contralateral Common Femoral Vein: Respiratory phasicity is normal and symmetric with the symptomatic side. No evidence of thrombus. Normal compressibility. Common Femoral Vein: No evidence of thrombus. Normal compressibility, respiratory phasicity and response to augmentation. Saphenofemoral Junction: No evidence of thrombus. Normal compressibility and flow on color Doppler imaging. Profunda Femoral Vein: No evidence of thrombus. Normal compressibility and flow on color Doppler imaging. Femoral Vein: No evidence of thrombus. Normal compressibility, respiratory phasicity and response to augmentation. Popliteal Vein: No evidence of thrombus. Normal compressibility, respiratory phasicity and response to augmentation. Calf Veins: No evidence of thrombus. Normal compressibility and flow on color Doppler imaging. Superficial Great Saphenous Vein: No evidence of thrombus. Normal compressibility. Venous Reflux:  None. Other Findings:  None. IMPRESSION: No evidence of deep venous thrombosis. Electronically Signed   By: Alcide Clever M.D.   On: 10/23/2016 17:08    Procedures Procedures (including critical care time) Angiocath insertion Performed by: Lavera Guise  Consent: Verbal consent obtained. Risks and benefits: risks, benefits and alternatives were discussed Time out: Immediately prior to procedure a "time out" was called to verify the correct patient, procedure, equipment, support staff and site/side marked as required.  Preparation: Patient was prepped and draped in the usual sterile fashion.  Vein Location: right AC  Ultrasound Guided  Gauge: 20G  Normal blood return and flush without difficulty Patient tolerance: Patient tolerated the procedure well with no immediate complications.    Medications Ordered in ED Medications  nitroGLYCERIN (NITROSTAT) SL tablet 0.4 mg (not administered)  morphine 4 MG/ML injection 4 mg (4 mg Intravenous Given 10/23/16 1748)  ondansetron (ZOFRAN) injection 4  mg (4 mg Intravenous Given 10/23/16 1748)  iopamidol (ISOVUE-370) 76 % injection 100 mL (100 mLs Intravenous Contrast Given 10/23/16 1707)  aspirin EC tablet 325 mg (325 mg Oral Given 10/23/16 1845)  ondansetron (ZOFRAN) injection 4 mg (4 mg Intravenous Given 10/23/16 1841)     Initial Impression / Assessment and Plan / ED Course  I have reviewed the triage vital signs and the nursing notes.  Pertinent labs & imaging results that were available  during my care of the patient were reviewed by me and considered in my medical decision making (see chart for details).     51 year old female with recent PE/DVT history who presents with chest pain, dyspnea, and left lower leg pain. I initially tachycardic, but without increased work of breathing or hypoxia.  Has been having a subtherapeutic INR for about 2-3 weeks now per patient. Continues to be subtherapeutic today. Concern for possibly increased PE burden or new DVT given this history.Subsequent left lower extremity ultrasound does not show evidence of acute DVT. A CT angios of the chest obtained, visualized and shows no PE or other acute cardiopulmonary processes.  Her EKG is nonischemic. Troponin 1 is normal. Overall heart score of 4. With ongoing symptoms. Discussed with patient, who is agreeable to come into the hospital as observation chest pain evaluation.   Discussed with Dr. Adela Glimpseoutova.   Final Clinical Impressions(s) / ED Diagnoses   Final diagnoses:  Nonspecific chest pain    New Prescriptions New Prescriptions   No medications on file     Lavera GuiseLiu, Jessy Cybulski Duo, MD 10/23/16 [phone]

## 2016-10-23 NOTE — H&P (Signed)
Dana Morrow ZOX:[id number] DOB: Nov 27, 1965 DOA: 10/23/2016     PCP: Jeoffrey Massed, MD   Outpatient Specialists: GI, current S orthopedics Patient coming from:   home Lives  With   Chief Complaint: chest pain  HPI: Dana Morrow is a 51 y.o. female with medical history significant of DVT, PE complex regional pain involving left arm and shoulder chronic opioids, HTN, HLD    Presented with left-sided chest pain is radiating down her legs and up her left arm is been going on for past 2-3 days but become progressively worse at first intermittent but now more of a constant she have had some nausea associated with no shortness of breath  similar to prior PE. Reports pain in left calf, decribes pain as pressure coughing seems to make it worse.  o associated leg swelling describes pain more like she has pressure initially 10 out of 10 improved with Dilaudid but then came back  no associated fevers or chills Now that in August patient was diagnosed with pulmonary embolism has been initially taking Eliquis but could not afford it and was switched to Coumadin and Lovenox she has finished Lovenox injections 2 weeks ago but it seems that her Coumadin has been still subtherapeutic  Of note during CT she has 75ml of contrast extravasate into right arms with now some swelling,  No weight loss.  Regarding pertinent Chronic problems:   51 [place] chronic left arm pain in the past requiring spinal no cord stimulator implantation but had to be taken out secondary to infection she had had stellate ganglion blocks which have helped  IN ER:  Temp (24hrs), Avg:98.8 F (37.1 C), Min:98.7 F (37.1 C), Max:98.9 F (37.2 C)      on arrival  ED Triage Vitals  Enc Vitals Group     BP 10/23/16 1500 (!) 167/94     Pulse Rate 10/23/16 1500 (!) 119     Resp 10/23/16 1502 20     Temp 10/23/16 1502 98.9 F (37.2 C)     Temp Source 10/23/16 1502 Oral   SpO2 10/23/16 1500 99 %     Weight 10/23/16 1502 182 lb (82.6 kg)     Height 10/23/16 1502 5\' 3"  (1.6 m)     Head Circumference --      Peak Flow --      Pain Score 10/23/16 1458 7     Pain Loc --      Pain Edu? --      Excl. in [facility]? --     Latest  RR 26 HR 99 BP 122/88 Trop ,0.03  INR 1.34 BNP 121 WBC 19 hemoglobin 15.2 na 135 K 3.5 Cr 0.81 CT angio no PE Doppler neg for PE Following Medications were ordered in ER: Medications  morphine 4 MG/ML injection 4 mg (4 mg Intravenous Given 10/23/16 1748)  ondansetron (ZOFRAN) injection 4 mg (4 mg Intravenous Given 10/23/16 1748)  iopamidol (ISOVUE-370) 76 % injection 100 mL (100 mLs Intravenous Contrast Given 10/23/16 1707)  aspirin EC tablet 325 mg (325 mg Oral Given 10/23/16 1845)  ondansetron (ZOFRAN) injection 4 mg (4 mg Intravenous Given 10/23/16 1841)  nitroGLYCERIN (NITROSTAT) SL tablet 0.4 mg (0.4 mg Sublingual Given 10/23/16 1920)  gi cocktail (Maalox,Lidocaine,Donnatal) (30 mLs Oral Given 10/23/16 1920)  HYDROmorphone (DILAUDID) injection 1 mg (1 mg Intravenous Given 10/23/16 1926)      Hospitalist was called for  admission for chest pain evaluation  Review of Systems:    Pertinent positives include:leg pain chest pain, left arm pain  Constitutional:  No weight loss, night sweats, Fevers, chills, fatigue, weight loss  HEENT:  No headaches, Difficulty swallowing,Tooth/dental problems,Sore throat,  No sneezing, itching, ear ache, nasal congestion, post nasal drip,  Cardio-vascular:  No  Orthopnea, PND, anasarca, dizziness, palpitations.no Bilateral lower extremity swelling  GI:  No heartburn, indigestion, abdominal pain, nausea, vomiting, diarrhea, change in bowel habits, loss of appetite, melena, blood in stool, hematemesis Resp:  no shortness of breath at rest. No dyspnea on exertion, No excess mucus, no productive cough, No non-productive cough, No coughing up of blood.No change in color of mucus.No wheezing. Skin:    no rash or lesions. No jaundice GU:  no dysuria, change in color of urine, no urgency or frequency. No straining to urinate.  No flank pain.  Musculoskeletal:  No joint pain or no joint swelling. No decreased range of motion. No back pain.  Psych:  No change in mood or affect. No depression or anxiety. No memory loss.  Neuro: no localizing neurological complaints, no tingling, no weakness, no double vision, no gait abnormality, no slurred speech, no confusion  As per HPI otherwise 10 point review of systems negative.   Past Medical History: Past Medical History:  Diagnosis Date  . Abdominal pain, chronic, left lower quadrant    a. s/p Laproscopy in 2004 w/o finding of source of discomfort.  . Anxiety and depression 2013   Lots of stress  . Bilateral pulmonary embolism (HCC) 08/08/2016   with right popliteal vein DVT--hospitalized and d/c'd home on eliquis.  . Chest pain    a. reportedly nl MV and echo 10/2011 - [place]  . Cholelithiasis    a. s/p cholecystectomy 10/2011  . Chronic pain disorder   . Colon polyps    a. s/p colonoscopy and polypectomy in 2003  . Diverticulosis    with ? of 'itis  . Family history of colon cancer    mother  . GERD (gastroesophageal reflux disease)   . Hiatal hernia    a. small by EGD 2006.  [name] History of amaurosis fugax 01/2011   L eye; saw neuro at [facility] (Dr. Johnell Comings).  Lab w/u and carotid doppler nl per old records.  . Hyperlipidemia    LDL 191 in 2013 per old records  . Hypertension   . Migraine   . RSD (reflex sympathetic dystrophy)    a. L shoulder--pain mgmt with Dr. Marlynn Perking at [facility] [place]  . Tobacco abuse    a. 50 pack year hx.  . Visual disturbance    a. 12/2011 Left eye visual changes->w/u for TIA->Carotid U/S:  No ICA stenosis; Head CT: No acute abnl; MRI/MRA: mild RPCA stenosis; Echo: EF 55-60%, Triv AI, No PFO.   Past Surgical History:  Procedure Laterality Date   . CESAREAN SECTION  1990  . COLONOSCOPY  10/2001   Dr. Ewing Schlein; diverticulosis, int/ext hem, one small polyp removed.  . ENDOMETRIAL ABLATION  2011  . ESOPHAGOGASTRODUODENOSCOPY  02/2004   Dr. Ewing Schlein; normal  . LAPAROSCOPIC CHOLECYSTECTOMY  10/19/11  . LAPAROSCOPY  05/2002   Dr. Harmon Dun cause for LLQ abd pain found  . SHOULDER SURGERY Left 1997 &98  . TRANSTHORACIC ECHOCARDIOGRAM  08/09/2016   Normal except grd I DD.  [name] UMBILICAL HERNIA REPAIR  2014   with mesh     Social History:  Ambulatory  Independently  reports that she has been smoking Cigarettes.  She has a 15.00 pack-year smoking history. She has never used smokeless tobacco. She reports that she does not drink alcohol or use drugs.  Allergies:   Allergies  Allergen Reactions  . Flagyl [Metronidazole] Nausea And Vomiting    Oral form only, IV pt can tolerate  . Elavil [Amitriptyline]     hallucinations  . Klonopin [Clonazepam]     Hallucinations   . Levaquin [Levofloxacin Hemihydrate] Hives       Family History:   Family History  Problem Relation Age of Onset  . Dementia Father   . Parkinson's disease Father        d. 2013  . Hypertension Father   . Hyperlipidemia Father   . Heart disease Father   . Heart attack Mother        died @ 35  . Colon cancer Mother   . Hyperlipidemia Mother   . Heart disease Mother   . Hypertension Mother   . Esophageal cancer Brother   . Diabetes Neg Hx     Medications: Prior to Admission medications   Medication Sig Start Date End Date Taking? Authorizing Provider  cloNIDine (CATAPRES) 0.3 MG tablet TAKE ONE TABLET BY MOUTH 3 TIMES DAILY Patient taking differently: TAKE O.3MG  BY MOUTH 3 TIMES DAILY 07/30/16  Yes McGowen, Maryjean Morn, MD  HYDROmorphone (DILAUDID) 2 MG tablet Take 2 mg by mouth 4 (four) times daily as needed for moderate pain.    Yes [provider]  oxyCODONE-acetaminophen (ROXICET) 5-325 MG tablet Take 1 tablet by mouth every 6 (six)  hours as needed. Patient taking differently: Take 1 tablet by mouth every 6 (six) hours as needed for moderate pain.  08/09/16  Yes Amin, Loura Halt, MD  promethazine (PHENERGAN) 12.5 MG tablet 1-2 tabs po q6h prn nausea 09/05/16  Yes McGowen, Maryjean Morn, MD  warfarin (COUMADIN) 2.5 MG tablet 1 tab po qd and adjust as directed by MD Patient taking differently: 7.5 mg. 1 tab po qd and adjust as directed by MD 09/05/16  Yes McGowen, Maryjean Morn, MD  albuterol (VENTOLIN HFA) 108 (90 Base) MCG/ACT inhaler Inhale 1-2 puffs into the lungs every 6 (six) hours as needed for wheezing or shortness of breath. Patient not taking: Reported on 09/05/2016 03/14/16   Sharlene Dory, DO  amoxicillin-clavulanate (AUGMENTIN) 875-125 MG tablet Take 1 tablet by mouth 2 (two) times daily. Patient not taking: Reported on 10/23/2016 09/05/16   Jeoffrey Massed, MD    Physical Exam: Patient Vitals for the past 24 hrs:  BP Temp Temp src Pulse Resp SpO2 Height Weight  10/23/16 2239 (!) 143/67 98.7 F (37.1 C) Oral 87 (!) 22 - 5\' 3"  (1.6 m) 90.4 kg (199 lb 6.4 oz)  10/23/16 2100 122/88 - - - (!) 26 - - -  10/23/16 2030 118/71 - - - 15 - - -  10/23/16 2003 122/71 - - - (!) 26 - - -  10/23/16 1935 132/90 - - 93 (!) 21 98 % - -  10/23/16 1831 (!) 159/90 - - 85 18 96 % - -  10/23/16 1830 (!) 159/90 - - 87 14 97 % - -  10/23/16 1601 (!) 150/88 - - (!) 104 18 96 % - -  10/23/16 1502 (!) 167/94 98.9 F (37.2 C) Oral (!) 115 20 99 % 5\' 3"  (1.6 m) 82.6 kg (182 lb)  10/23/16 1500 (!) 167/94 - - (!) 119 - 99 % - -  1. General:  in No Acute distress  well  -appearing 2. Psychological: Alert and   Oriented 3. Head/ENT:     Dry Mucous Membranes                          Head Non traumatic, neck supple                          Normal  Dentition 4. SKIN:   decreased Skin turgor,  Skin clean Dry and intact no rash 5. Heart: Regular rate and rhythm no Murmur, no Rub or gallop 6. Lungs:  no wheezes or crackles   7. Abdomen:  Soft,  non-tender, Non distended   obese  bowel sounds present 8. Lower extremities: no clubbing, cyanosis, or edema 9. Neurologically Grossly intact, moving all 4 extremities equally  10. MSK: Normal range of motion Right arm swelling  body mass index is 35.32 kg/m.  Labs on Admission:   Labs on Admission: I have personally reviewed following labs and imaging studies  CBC:  Recent Labs Lab 10/23/16 1521  WBC 19.0*  NEUTROABS 14.8*  HGB 15.2*  HCT 43.2  MCV 90.9  PLT 284   Basic Metabolic Panel:  Recent Labs Lab 10/23/16 1521  NA 135  K 3.5  CL 102  CO2 24  GLUCOSE 186*  BUN 16  CREATININE 0.81  CALCIUM 9.1   GFR: Estimated Creatinine Clearance: 86.7 mL/min (by C-G formula based on SCr of 0.81 mg/dL). Liver Function Tests:  Recent Labs Lab 10/23/16 1521  AST 19  ALT 15  ALKPHOS 74  BILITOT 0.5  PROT 7.9  ALBUMIN 3.9   No results for input(s): LIPASE, AMYLASE in the last 168 hours. No results for input(s): AMMONIA in the last 168 hours. Coagulation Profile:  Recent Labs Lab 10/23/16 1547  INR 1.34   Cardiac Enzymes:  Recent Labs Lab 10/23/16 1521 10/23/16 1915  TROPONINI <0.03 <0.03   BNP (last 3 results) No results for input(s): PROBNP in the last 8760 hours. HbA1C: No results for input(s): HGBA1C in the last 72 hours. CBG: No results for input(s): GLUCAP in the last 168 hours. Lipid Profile: No results for input(s): CHOL, HDL, LDLCALC, TRIG, CHOLHDL, LDLDIRECT in the last 72 hours. Thyroid Function Tests: No results for input(s): TSH, T4TOTAL, FREET4, T3FREE, THYROIDAB in the last 72 hours. Anemia Panel: No results for input(s): VITAMINB12, FOLATE, FERRITIN, TIBC, IRON, RETICCTPCT in the last 72 hours. Urine analysis: Sepsis Labs: @LABRCNTIP (procalcitonin:4,lacticidven:4) )No results found for this or any previous visit (from the past 240 hour(s)).    UA not ordered  Lab Results  Component Value Date   HGBA1C 5.7 (H)  12/18/2011    Estimated Creatinine Clearance: 86.7 mL/min (by C-G formula based on SCr of 0.81 mg/dL).  BNP (last 3 results) No results for input(s): PROBNP in the last 8760 hours.   ECG REPORT  Independently reviewed Rate: 112  Rhythm: sinus tachycardia ST&T Change: No acute ischemic changes   QTC 426  Filed Weights   10/23/16 1502 10/23/16 2239  Weight: 82.6 kg (182 lb) 90.4 kg (199 lb 6.4 oz)     Cultures:    Component Value Date/Time   SDES URINE, CLEAN CATCH 09/21/2013 1949   SPECREQUEST NONE 09/21/2013 1949   CULT NO GROWTH Performed at [facility] 09/21/2013 1949   REPTSTATUS 09/23/2013 FINAL 09/21/2013 1949     Radiological Exams on Admission: Dg  Chest 2 View  Result Date: 10/23/2016 CLINICAL DATA:  Left-sided chest pain EXAM: CHEST  2 VIEW COMPARISON:  08/08/2016 FINDINGS: The heart size and mediastinal contours are within normal limits. Both lungs are clear. The visualized skeletal structures are unremarkable. IMPRESSION: No active cardiopulmonary disease. Electronically Signed   By: Alcide Clever M.D.   On: 10/23/2016 15:50   Ct Angio Chest Pe W And/or Wo Contrast  Result Date: 10/23/2016 CLINICAL DATA:  Left-sided chest pain EXAM: CT ANGIOGRAPHY CHEST WITH CONTRAST TECHNIQUE: Multidetector CT imaging of the chest was performed using the standard protocol during bolus administration of intravenous contrast. Multiplanar CT image reconstructions and MIPs were obtained to evaluate the vascular anatomy. CONTRAST:  175 mL Isovue 370. There was an initial extravasation at the beginning of the procedure with approximately 75 mL of contrast extravasated into the right upper arm. Patient was counseled with regards to cool compresses as well as monitoring the site for any potential skin changes although based on the initial scout image, bruising and muscle soreness is likely more common. COMPARISON:  08/08/2016 FINDINGS: Cardiovascular: Thoracic aorta demonstrates  mild atherosclerotic change without aneurysmal dilatation or dissection. No significant cardiac enlargement is noted. Minimal coronary calcifications are seen. Pulmonary artery shows a normal branching pattern. No filling defects are identified to suggest pulmonary embolism. Mediastinum/Nodes: Thoracic inlet is within normal limits. The esophagus is within normal limits. A few small right peritracheal and right hilar lymph nodes are noted stable from previous exams dating back to 2017 and likely benign in etiology. Lungs/Pleura: Lungs are well aerated bilaterally. No focal infiltrate or sizable effusion is seen. Mild patchy atelectatic changes are noted in the left lung without focal confluent infiltrate. No sizable effusion is noted. Upper Abdomen: Changes of prior cholecystectomy are noted. Mild fullness of biliary tree is noted consistent with the post cholecystectomy state. No other focal abnormality is noted. Musculoskeletal: No acute bony abnormality is noted. Review of the MIP images confirms the above findings. IMPRESSION: No evidence of acute pulmonary emboli. The filling defects seen on the prior exam have resolved in the interval. No acute abnormality identified. Electronically Signed   By: Alcide Clever M.D.   On: 10/23/2016 18:26   US Venous Img Lower Unilateral Left  Result Date: 10/23/2016 CLINICAL DATA:  Left leg pain for several hours, history of deep venous thrombosis on the right with current blood thinners EXAM: LEFT LOWER EXTREMITY VENOUS DOPPLER ULTRASOUND TECHNIQUE: Gray-scale sonography with graded compression, as well as color Doppler and duplex ultrasound were performed to evaluate the lower extremity deep venous systems from the level of the common femoral vein and including the common femoral, femoral, profunda femoral, popliteal and calf veins including the posterior tibial, peroneal and gastrocnemius veins when visible. The superficial great saphenous vein was also interrogated.  Spectral Doppler was utilized to evaluate flow at rest and with distal augmentation maneuvers in the common femoral, femoral and popliteal veins. COMPARISON:  08/08/2016 FINDINGS: Contralateral Common Femoral Vein: Respiratory phasicity is normal and symmetric with the symptomatic side. No evidence of thrombus. Normal compressibility. Common Femoral Vein: No evidence of thrombus. Normal compressibility, respiratory phasicity and response to augmentation. Saphenofemoral Junction: No evidence of thrombus. Normal compressibility and flow on color Doppler imaging. Profunda Femoral Vein: No evidence of thrombus. Normal compressibility and flow on color Doppler imaging. Femoral Vein: No evidence of thrombus. Normal compressibility, respiratory phasicity and response to augmentation. Popliteal Vein: No evidence of thrombus. Normal compressibility, respiratory phasicity and response to augmentation. Calf  Veins: No evidence of thrombus. Normal compressibility and flow on color Doppler imaging. Superficial Great Saphenous Vein: No evidence of thrombus. Normal compressibility. Venous Reflux:  None. Other Findings:  None. IMPRESSION: No evidence of deep venous thrombosis. Electronically Signed   By: Alcide Clever M.D.   On: 10/23/2016 17:08    Chart has been reviewed    Assessment/Plan  51 y.o. female with medical history significant of DVT, PE complex regional pain involving left arm and shoulder chronic opioids, HTN, HLD  Admitted for atypical chest  Present on Admission: . Chest pain - - H=   1     ,E=0   , A=  1  ,  R 2   , T 0 ,  for the  Total of 4 therefore will admit for observation and further evaluation ( Risk of MACE: Scores 0-3  of 0.9-1.7%.,  4-6: 12-16.6% , Scores ?7: 50-65% )   - Other explanation for chest pain could be GI related versus musculoskeletal   - monitor on telemetry, cycle cardiac enzymes, obtain serial ECG and  ECHO in AM.   - Daily aspirin -  Further risk stratify with lipid  panel, hgA1C, obtain TSH.  Make sure patient is on Aspirin.  We will notify cardiology regarding patient's admission. Further management depends on pending  workup  . Chronic pain syndrome restart home medications . Essential hypertension -stable continue home medication . Dehydration will rehydrate . Leukocytosis patient appears to be hemoconcentrated this point no evidence of infection of obtain UA to confirmrehydrate and repeat, patient have had elevated blood cell count in the past currently smear showing atypical lymphocytes if persists will need to father evaluation by  hematology . Tobacco abuse -  - Spoke about importance of quitting,   - order nicotine patch   - nursing tobacco cessation protocol  History of DVT/PE patient currently subtherapeutic on Coumadin will restart Lovenox/Coumadin per pharmacy  Other plan as per orders.  DVT prophylaxis:  Coumadin Lovenox     Code Status:  FULL CODE as per patient   Family Communication:   Family not  at  Bedside    Disposition Plan:     To home once workup is complete and patient is stable                              Consults called: email cardiology     Admission status  obs   Level of care   tele        I have spent a total of 56 min on this admission   Darvin Dials 10/24/2016, 12:53 AM    [facility]  Pager [phone]   after 2 AM please page floor coverage PA If 7AM-7PM, please contact the day team taking care of the patient  [email or web address]  Password [email or web address]

## 2016-10-23 NOTE — ED Notes (Signed)
MD in CT to see pt and RT tech, IV infiltrated unable to image pt, pt returned to room for MD to start another IV

## 2016-10-23 NOTE — Plan of Care (Signed)
51 yo HTN HLD, fam CAD, DVT, tobacco o coumadin for DVT PE in August, switched from Eliquis switching to Coumadin  now off Loveox but still non theraputic Left sided chest pain radiating down left arm, nausea vomiting yesterday itermittent INR 1.3  CT chest neg Doppler neg for DVT trop neg HEART score 4 ACS rull out  not chest pain free. Asked Dr. Verdie MosherLiu to repeat trop and to page me if status changes if not will need stepdown bed  Chundra Sauerwein 7:15 PM

## 2016-10-23 NOTE — Progress Notes (Signed)
ANTICOAGULATION CONSULT NOTE - Initial Consult  Pharmacy Consult for Lovenox and Warfarin Indication: pulmonary embolus  Allergies  Allergen Reactions  . Flagyl [Metronidazole] Nausea And Vomiting    Oral form only, IV pt can tolerate  . Elavil [Amitriptyline]     hallucinations  . Klonopin [Clonazepam]     Hallucinations   . Levaquin [Levofloxacin Hemihydrate] Hives    Patient Measurements: Height: 5\' 3"  (160 cm) Weight: 199 lb 6.4 oz (90.4 kg) IBW/kg (Calculated) : 52.4   Vital Signs: Temp: 98.7 F (37.1 C) (10/17 2239) Temp Source: Oral (10/17 2239) BP: 143/67 (10/17 2239) Pulse Rate: 87 (10/17 2239)  Labs:  Recent Labs  10/23/16 1521 10/23/16 1547 10/23/16 1915  HGB 15.2*  --   --   HCT 43.2  --   --   PLT 284  --   --   LABPROT  --  16.5*  --   INR  --  1.34  --   CREATININE 0.81  --   --   TROPONINI <0.03  --  <0.03    Estimated Creatinine Clearance: 86.7 mL/min (by C-G formula based on SCr of 0.81 mg/dL).   Medical History: Past Medical History:  Diagnosis Date  . Abdominal pain, chronic, left lower quadrant    a. s/p Laproscopy in 2004 w/o finding of source of discomfort.  . Anxiety and depression 2013   Lots of stress  . Bilateral pulmonary embolism (HCC) 08/08/2016   with right popliteal vein DVT--hospitalized and d/c'd home on eliquis.  . Chest pain    a. reportedly nl MV and echo 10/2011 - [place]  . Cholelithiasis    a. s/p cholecystectomy 10/2011  . Chronic pain disorder   . Colon polyps    a. s/p colonoscopy and polypectomy in 2003  . Diverticulosis    with ? of 'itis  . Family history of colon cancer    mother  . GERD (gastroesophageal reflux disease)   . Hiatal hernia    a. small by EGD 2006.  [name] History of amaurosis fugax 01/2011   L eye; saw neuro at [facility] (Dr. Johnell Comings).  Lab w/u and carotid doppler nl per old records.  . Hyperlipidemia    LDL 191 in 2013 per old records  .  Hypertension   . Migraine   . RSD (reflex sympathetic dystrophy)    a. L shoulder--pain mgmt with Dr. Marlynn Perking at [facility] [place]  . Tobacco abuse    a. 50 pack year hx.  . Visual disturbance    a. 12/2011 Left eye visual changes->w/u for TIA->Carotid U/S:  No ICA stenosis; Head CT: No acute abnl; MRI/MRA: mild RPCA stenosis; Echo: EF 55-60%, Triv AI, No PFO.    Medications:  Scheduled:  . cloNIDine  0.3 mg Oral TID  . [START ON 10/24/2016] enoxaparin (LOVENOX) injection  90 mg Subcutaneous Q12H  . [START ON 10/24/2016] warfarin  2.5 mg Oral Once  . [START ON 10/24/2016] Warfarin - Pharmacist Dosing Inpatient   Does not apply q1800   Infusions:    Assessment: 50 yoF with hx of DVT/PE in august, switched from eliquis to warfarin a couple of weeks ago with INR=1.34 LD 10/17 0600.  HD=7.5 mg daily.  Lovenox and Warfarin dosing per Rx.  Goal of Therapy:  INR 2-3 Anti-Xa level 0.6-1 units/ml 4hrs after LMWH dose given    Plan:  Lovenox 90 mg sq q12h Give additional 2.5 mg warfarin now = 10 mg on 10/17  Daily PT/INR   Lorenza EvangelistGreen, Dessie Delcarlo R 10/23/2016,11:54 PM

## 2016-10-23 NOTE — ED Triage Notes (Signed)
Hx of DVT, pt recently was changed from Lovenox to 7.5mg  coumadin with subtherapeutic INR.

## 2016-10-24 ENCOUNTER — Other Ambulatory Visit: Payer: Self-pay

## 2016-10-24 ENCOUNTER — Observation Stay (HOSPITAL_COMMUNITY): Payer: Medicare Other

## 2016-10-24 ENCOUNTER — Other Ambulatory Visit: Payer: Self-pay | Admitting: Student

## 2016-10-24 ENCOUNTER — Observation Stay (HOSPITAL_BASED_OUTPATIENT_CLINIC_OR_DEPARTMENT_OTHER): Payer: Medicare Other

## 2016-10-24 DIAGNOSIS — R079 Chest pain, unspecified: Secondary | ICD-10-CM | POA: Diagnosis not present

## 2016-10-24 DIAGNOSIS — Z8249 Family history of ischemic heart disease and other diseases of the circulatory system: Secondary | ICD-10-CM | POA: Diagnosis not present

## 2016-10-24 DIAGNOSIS — I2699 Other pulmonary embolism without acute cor pulmonale: Secondary | ICD-10-CM | POA: Diagnosis not present

## 2016-10-24 DIAGNOSIS — I2584 Coronary atherosclerosis due to calcified coronary lesion: Secondary | ICD-10-CM

## 2016-10-24 DIAGNOSIS — R000 Tachycardia, unspecified: Secondary | ICD-10-CM | POA: Diagnosis not present

## 2016-10-24 DIAGNOSIS — Z7901 Long term (current) use of anticoagulants: Secondary | ICD-10-CM | POA: Diagnosis not present

## 2016-10-24 DIAGNOSIS — Z86711 Personal history of pulmonary embolism: Secondary | ICD-10-CM | POA: Diagnosis not present

## 2016-10-24 DIAGNOSIS — D72825 Bandemia: Secondary | ICD-10-CM | POA: Diagnosis not present

## 2016-10-24 DIAGNOSIS — G894 Chronic pain syndrome: Secondary | ICD-10-CM | POA: Diagnosis not present

## 2016-10-24 DIAGNOSIS — E860 Dehydration: Secondary | ICD-10-CM | POA: Diagnosis not present

## 2016-10-24 DIAGNOSIS — R0781 Pleurodynia: Secondary | ICD-10-CM | POA: Diagnosis not present

## 2016-10-24 DIAGNOSIS — D751 Secondary polycythemia: Secondary | ICD-10-CM | POA: Diagnosis not present

## 2016-10-24 DIAGNOSIS — F17210 Nicotine dependence, cigarettes, uncomplicated: Secondary | ICD-10-CM | POA: Diagnosis not present

## 2016-10-24 DIAGNOSIS — I2510 Atherosclerotic heart disease of native coronary artery without angina pectoris: Secondary | ICD-10-CM

## 2016-10-24 DIAGNOSIS — I313 Pericardial effusion (noninflammatory): Secondary | ICD-10-CM

## 2016-10-24 DIAGNOSIS — R0789 Other chest pain: Secondary | ICD-10-CM | POA: Diagnosis not present

## 2016-10-24 DIAGNOSIS — D72829 Elevated white blood cell count, unspecified: Secondary | ICD-10-CM | POA: Diagnosis not present

## 2016-10-24 DIAGNOSIS — I10 Essential (primary) hypertension: Secondary | ICD-10-CM | POA: Diagnosis not present

## 2016-10-24 DIAGNOSIS — Z86718 Personal history of other venous thrombosis and embolism: Secondary | ICD-10-CM | POA: Diagnosis not present

## 2016-10-24 LAB — ECHOCARDIOGRAM LIMITED
Height: 63 in
Weight: 3190.4 oz

## 2016-10-24 LAB — URINALYSIS, ROUTINE W REFLEX MICROSCOPIC
BILIRUBIN URINE: NEGATIVE
Glucose, UA: NEGATIVE mg/dL
Hgb urine dipstick: NEGATIVE
Ketones, ur: NEGATIVE mg/dL
LEUKOCYTES UA: NEGATIVE
NITRITE: NEGATIVE
Protein, ur: NEGATIVE mg/dL
pH: 5 (ref 5.0–8.0)

## 2016-10-24 LAB — TROPONIN I

## 2016-10-24 MED ORDER — RIVAROXABAN 20 MG PO TABS
20.0000 mg | ORAL_TABLET | ORAL | Status: AC
  Administered 2016-10-24: 20 mg via ORAL
  Filled 2016-10-24: qty 1

## 2016-10-24 MED ORDER — NICOTINE 7 MG/24HR TD PT24
7.0000 mg | MEDICATED_PATCH | Freq: Every day | TRANSDERMAL | Status: DC
  Administered 2016-10-24: 7 mg via TRANSDERMAL
  Filled 2016-10-24: qty 1

## 2016-10-24 MED ORDER — RIVAROXABAN 20 MG PO TABS: 20.0000 mg | ORAL_TABLET | Freq: Every day | ORAL | 0 refills | Status: AC

## 2016-10-24 MED ORDER — RIVAROXABAN 20 MG PO TABS: 20.0000 mg | ORAL_TABLET | Freq: Every day | ORAL | Status: DC

## 2016-10-24 MED ORDER — RIVAROXABAN (XARELTO) EDUCATION KIT FOR DVT/PE PATIENTS
PACK | Freq: Once | Status: AC
  Administered 2016-10-24: 14:00:00
  Filled 2016-10-24: qty 1

## 2016-10-24 NOTE — Progress Notes (Signed)
Spoke with pt concerning Part D coverage. Pt states Part D will be in effect next month. Pharmacist to give coupon for free trail for 30 days and $10 co pay.

## 2016-10-24 NOTE — Progress Notes (Signed)
  Echocardiogram 2D Echocardiogram limited has been performed.  Nolon RodBrown, Tony 10/24/2016, 10:50 AM

## 2016-10-24 NOTE — Consult Note (Signed)
Cardiology Consult    Patient ID: Dana Morrow; [id number]; Nov 15, 1965   Admit date: 10/23/2016 Date of Consult: 10/24/2016  Primary Care Provider: Jeoffrey Massed, MD Primary Cardiologist: Seen by Dr. [place] in 2013; Also followed by [facility] in 2013. No follow-up since.    Patient Profile    Dana Morrow is a 51 y.o. female with past medical history of chronic pain syndrome, HTN, PE/DVT (diagnosed in 08/2016), and tobacco use who is being seen today for the evaluation of chest pain at the request of Dr. Adela Glimpse.   History of Present Illness    Ms. Suchocki was recently admitted to [facility] from 8/2 - 08/09/2016 for evaluation of acute dyspnea and chest pain, found to have bilateral PE with no evidence of heart strain. Her CTA did mention calcifications along the LM, right, and left coronary arteries. Doppler studies showed an acute DVT involving the right popliteal vein. An echocardiogram was also obtained and showed a preserved EF of 55-60%, Grade 1 DD, no regional WMA, mild AI, and mild MR. She was discharged on Eliquis but switched to Coumadin with Lovenox bridging in the interim due to financial concerns. The plan was to continue with anticoagulation for 6 months as she had been hospitalized in [place] in 07/2016 for 6 days and did not receive Pharmacologic or Mechanical DVT prophylaxis per her report.   She had been doing well since recent hospital discharge but presented back to [facility] on 10/23/2016 for evaluation of chest pain. Reports she started having episodes of chest pain two days prior which would occur at rest and last for 30-60 minutes at a time. Pain was worse with coughing and no association with exertion. Yesterday morning, she noticed the pain upon waking and says it was constant throughout the day, lasting for over 24 hours. Notes the pain was still worse with coughing or deep breathing.   She denies any prior cardiac history. Underwent stress  testing in 2013 which was normal. She does have HTN and is a current tobacco user but recently reduced her use from 1 ppd to 2 packs per week.   Initial labs show WBC of 19.0, Hgb 15.2, platelets 284, Na+ 135, K+ 3.5, and BNP 121. INR 1.34. Initial and cyclic troponin values have been negative. EKG shows sinus tachycardia, HR 112, with no acute ST or T-wave changes.  CXR with no active cardiopulmonary disease. Lower extremity dopplers without evidence of a DVT. CTA showed no evidence of PE with minimal coronary calcifications.    Past Medical History   Past Medical History:  Diagnosis Date  . Abdominal pain, chronic, left lower quadrant    a. s/p Laproscopy in 2004 w/o finding of source of discomfort.  . Anxiety and depression 2013   Lots of stress  . Bilateral pulmonary embolism (HCC) 08/08/2016   with right popliteal vein DVT--hospitalized and d/c'd home on eliquis.  . Chest pain    a. reportedly nl MV and echo 10/2011 - [place]  . Cholelithiasis    a. s/p cholecystectomy 10/2011  . Chronic pain disorder   . Colon polyps    a. s/p colonoscopy and polypectomy in 2003  . Diverticulosis    with ? of 'itis  . Family history of colon cancer    mother  . GERD (gastroesophageal reflux disease)   . Hiatal hernia    a. small by EGD 2006.  [name] History of amaurosis fugax 01/2011   L eye;  [facility] (Dr. Johnell ComingsMieden).  Lab w/u and carotid doppler nl per old records.  . Hyperlipidemia    LDL 191 in 2013 per old records  . Hypertension   . Migraine   . RSD (reflex sympathetic dystrophy)    a. L shoulder--pain mgmt with Dr. Marlynn [facility]kingMalloy [facility] [place]  . Tobacco abuse    a. 50 pack year hx.  . Visual disturbance    a. 12/2011 Left eye visual changes->w/u for TIA->Carotid U/S:  No ICA stenosis; Head CT: No acute abnl; MRI/MRA: mild RPCA stenosis; Echo: EF 55-60%, Triv AI, No PFO.     Allergies:   Allergies  Allergen Reactions   . Flagyl [Metronidazole] Nausea And Vomiting    Oral form only, IV pt can tolerate  . Elavil [Amitriptyline]     hallucinations  . Klonopin [Clonazepam]     Hallucinations   . Levaquin [Levofloxacin Hemihydrate] Hives    Home Medications:   Home Medications:  Prior to Admission medications   Medication Sig Start Date End Date Taking? Authorizing Provider  cloNIDine (CATAPRES) 0.3 MG tablet TAKE ONE TABLET BY MOUTH 3 TIMES DAILY Patient taking differently: TAKE O.3MG  BY MOUTH 3 TIMES DAILY 07/30/16  Yes McGowen, Maryjean MornPhilip H, MD  HYDROmorphone (DILAUDID) 2 MG tablet Take 2 mg by mouth 4 (four) times daily as needed for moderate pain.    Yes [provider]  oxyCODONE-acetaminophen (ROXICET) 5-325 MG tablet Take 1 tablet by mouth every 6 (six) hours as needed. Patient taking differently: Take 1 tablet by mouth every 6 (six) hours as needed for moderate pain.  08/09/16  Yes Amin, Loura HaltAnkit Chirag, MD  promethazine (PHENERGAN) 12.5 MG tablet 1-2 tabs po q6h prn nausea 09/05/16  Yes McGowen, Maryjean MornPhilip H, MD  warfarin (COUMADIN) 2.5 MG tablet 1 tab po qd and adjust as directed by MD Patient taking differently: 7.5 mg. 1 tab po qd and adjust as directed by MD 09/05/16  Yes McGowen, Maryjean MornPhilip H, MD  albuterol (VENTOLIN HFA) 108 (90 Base) MCG/ACT inhaler Inhale 1-2 puffs into the lungs every 6 (six) hours as needed for wheezing or shortness of breath. Patient not taking: Reported on 09/05/2016 03/14/16   Sharlene DoryWendling, Nicholas Paul, DO  amoxicillin-clavulanate (AUGMENTIN) 875-125 MG tablet Take 1 tablet by mouth 2 (two) times daily. Patient not taking: Reported on 10/23/2016 09/05/16   Jeoffrey MassedMcGowen, Philip H, MD    Inpatient Medications    Scheduled Meds: . cloNIDine  0.3 mg Oral TID  . enoxaparin (LOVENOX) injection  90 mg Subcutaneous Q12H  . nicotine  7 mg Transdermal Daily  . Warfarin - Pharmacist Dosing Inpatient   Does not apply q1800   Continuous Infusions:  PRN Meds: acetaminophen, albuterol,  HYDROmorphone, ondansetron (ZOFRAN) IV, oxyCODONE-acetaminophen  Family History    Family History  Problem Relation Age of Onset  . Dementia Father   . Parkinson's disease Father        d. 2013  . Hypertension Father   . Hyperlipidemia Father   . Heart disease Father   . Heart attack Mother        died @ 2067  . Colon cancer Mother   . Hyperlipidemia Mother   . Heart disease Mother   . Hypertension Mother   . Esophageal cancer Brother   . Diabetes Neg Hx     Social History    Social History   Social History  . Marital status: Divorced    Spouse name: N/A  .  Number of children: N/A  . Years of education: N/A   Occupational History  . Not on file.   Social History Main Topics  . Smoking status: Current Every Day Smoker    Packs/day: 0.50    Years: 30.00    Types: Cigarettes  . Smokeless tobacco: Never Used  . Alcohol use No  . Drug use: No  . Sexual activity: Not on file   Other Topics Concern  . Not on file   Social History Narrative   Divorced, 1 son, 1 granddaughter.   Relocated to [place] from [place] in 04/2014 to live with her boyfriend.     Had been walking 3 miles on a treadmill most days of the week until late October when she had cholecystectomy.   Tob 30 pack-yr hx.   Alc: none   No hx of substance abuse.     Review of Systems    General:  No chills, fever, night sweats or weight changes.  Cardiovascular:  No dyspnea on exertion, edema, orthopnea, palpitations, paroxysmal nocturnal dyspnea. Positive for chest pain.  Dermatological: No rash, lesions/masses Respiratory: No cough, dyspnea Urologic: No hematuria, dysuria Abdominal:   No nausea, vomiting, diarrhea, bright red blood per rectum, melena, or hematemesis Neurologic:  No visual changes, wkns, changes in mental status. All other systems reviewed and are otherwise negative except as noted above.  Physical Exam/Data    Blood pressure 116/61, pulse 71, temperature 98.2 F (36.8 C),  temperature source Oral, resp. rate 18, height 5\' 3"  (1.6 m), weight 199 lb 6.4 oz (90.4 kg), SpO2 98 %.  General: Pleasant, Caucasian female appearing in NAD Psych: Normal affect. Neuro: Alert and oriented X 3. Moves all extremities spontaneously. HEENT: Normal  Neck: Supple without bruits or JVD. Lungs:  Resp regular and unlabored, CTA without wheezing or rales. Heart: RRR no s3, s4, or murmurs. Abdomen: Soft, non-tender, non-distended, BS + x 4.  Extremities: No clubbing, cyanosis or edema. DP/PT/Radials 2+ and equal bilaterally.   EKG:  The EKG was personally reviewed and demonstrates:  Sinus tachycardia, HR 112, with no acute ST or T-wave changes.  Telemetry:  Telemetry was personally reviewed and demonstrates:  NSR, HR in 60's - 80's. No ectopic events.    Labs/Studies     Relevant CV Studies:  Echocardiogram: 08/09/2016 Study Conclusions  - Left ventricle: The cavity size was normal. Wall thickness was   normal. Systolic function was normal. The estimated ejection   fraction was in the range of 55% to 60%. Wall motion was normal;   there were no regional wall motion abnormalities. Doppler   parameters are consistent with abnormal left ventricular   relaxation (grade 1 diastolic dysfunction). Doppler parameters   are consistent with high ventricular filling pressure. - Aortic valve: There was mild regurgitation. - Mitral valve: Calcified annulus. Mildly thickened leaflets .   There was mild regurgitation.  Impressions:  - Normal LV systolic function; mild diastolic dysfunction;   sclerotic aortic valve with mild AI; mild MR.  Laboratory Data:  Chemistry Recent Labs Lab 10/23/16 1521  NA 135  K 3.5  CL 102  CO2 24  GLUCOSE 186*  BUN 16  CREATININE 0.81  CALCIUM 9.1  GFRNONAA >60  GFRAA >60  ANIONGAP 9     Recent Labs Lab 10/23/16 1521  PROT 7.9  ALBUMIN 3.9  AST 19  ALT 15  ALKPHOS 74  BILITOT 0.5   Hematology Recent Labs Lab  10/23/16 1521  WBC 19.0*  RBC  4.75  HGB 15.2*  HCT 43.2  MCV 90.9  MCH 32.0  MCHC 35.2  RDW 13.8  PLT 284   Cardiac Enzymes Recent Labs Lab 10/23/16 1521 10/23/16 1915 10/24/16 0008 10/24/16 0256  TROPONINI <0.03 <0.03 <0.03 <0.03   No results for input(s): TROPIPOC in the last 168 hours.  BNP Recent Labs Lab 10/23/16 1521  BNP 121.9*    DDimer No results for input(s): DDIMER in the last 168 hours.  Radiology/Studies:  Dg Chest 2 View  Result Date: 10/23/2016 CLINICAL DATA:  Left-sided chest pain EXAM: CHEST  2 VIEW COMPARISON:  08/08/2016 FINDINGS: The heart size and mediastinal contours are within normal limits. Both lungs are clear. The visualized skeletal structures are unremarkable. IMPRESSION: No active cardiopulmonary disease. Electronically Signed   By: Alcide Clever M.D.   On: 10/23/2016 15:50   Ct Angio Chest Pe W And/or Wo Contrast  Result Date: 10/23/2016 CLINICAL DATA:  Left-sided chest pain EXAM: CT ANGIOGRAPHY CHEST WITH CONTRAST TECHNIQUE: Multidetector CT imaging of the chest was performed using the standard protocol during bolus administration of intravenous contrast. Multiplanar CT image reconstructions and MIPs were obtained to evaluate the vascular anatomy. CONTRAST:  175 mL Isovue 370. There was an initial extravasation at the beginning of the procedure with approximately 75 mL of contrast extravasated into the right upper arm. Patient was counseled with regards to cool compresses as well as monitoring the site for any potential skin changes although based on the initial scout image, bruising and muscle soreness is likely more common. COMPARISON:  08/08/2016 FINDINGS: Cardiovascular: Thoracic aorta demonstrates mild atherosclerotic change without aneurysmal dilatation or dissection. No significant cardiac enlargement is noted. Minimal coronary calcifications are seen. Pulmonary artery shows a normal branching pattern. No filling defects are identified to  suggest pulmonary embolism. Mediastinum/Nodes: Thoracic inlet is within normal limits. The esophagus is within normal limits. A few small right peritracheal and right hilar lymph nodes are noted stable from previous exams dating back to 2017 and likely benign in etiology. Lungs/Pleura: Lungs are well aerated bilaterally. No focal infiltrate or sizable effusion is seen. Mild patchy atelectatic changes are noted in the left lung without focal confluent infiltrate. No sizable effusion is noted. Upper Abdomen: Changes of prior cholecystectomy are noted. Mild fullness of biliary tree is noted consistent with the post cholecystectomy state. No other focal abnormality is noted. Musculoskeletal: No acute bony abnormality is noted. Review of the MIP images confirms the above findings. IMPRESSION: No evidence of acute pulmonary emboli. The filling defects seen on the prior exam have resolved in the interval. No acute abnormality identified. Electronically Signed   By: Alcide Clever M.D.   On: 10/23/2016 18:26   US Venous Img Lower Unilateral Left  Result Date: 10/23/2016 CLINICAL DATA:  Left leg pain for several hours, history of deep venous thrombosis on the right with current blood thinners EXAM: LEFT LOWER EXTREMITY VENOUS DOPPLER ULTRASOUND TECHNIQUE: Gray-scale sonography with graded compression, as well as color Doppler and duplex ultrasound were performed to evaluate the lower extremity deep venous systems from the level of the common femoral vein and including the common femoral, femoral, profunda femoral, popliteal and calf veins including the posterior tibial, peroneal and gastrocnemius veins when visible. The superficial great saphenous vein was also interrogated. Spectral Doppler was utilized to evaluate flow at rest and with distal augmentation maneuvers in the common femoral, femoral and popliteal veins. COMPARISON:  08/08/2016 FINDINGS: Contralateral Common Femoral Vein: Respiratory phasicity is normal and  symmetric  with the symptomatic side. No evidence of thrombus. Normal compressibility. Common Femoral Vein: No evidence of thrombus. Normal compressibility, respiratory phasicity and response to augmentation. Saphenofemoral Junction: No evidence of thrombus. Normal compressibility and flow on color Doppler imaging. Profunda Femoral Vein: No evidence of thrombus. Normal compressibility and flow on color Doppler imaging. Femoral Vein: No evidence of thrombus. Normal compressibility, respiratory phasicity and response to augmentation. Popliteal Vein: No evidence of thrombus. Normal compressibility, respiratory phasicity and response to augmentation. Calf Veins: No evidence of thrombus. Normal compressibility and flow on color Doppler imaging. Superficial Great Saphenous Vein: No evidence of thrombus. Normal compressibility. Venous Reflux:  None. Other Findings:  None. IMPRESSION: No evidence of deep venous thrombosis. Electronically Signed   By: Alcide Clever M.D.   On: 10/23/2016 17:08     Assessment & Plan    1. Pleuritic Chest Pain  - initially developed episodes of chest pain two days prior which would occur at rest and last for 30-60 minutes at a time. Her pain at the time of admission had been present constantly for over 24 hours and worse with coughing or deep breathing.  -  Initial and cyclic troponin values have been negative. EKG shows sinus tachycardia, HR 112, with no acute ST or T-wave changes. CTA showed no evidence of PE with minimal coronary calcifications (of note, coronary calcifications were also noted on her prior CTA).  - recent echocardiogram in 08/2016 showed a preserved EF of 55-60%, Grade 1 DD, no regional WMA, mild AI, and mild MR. A repeat complete echocardiogram had been ordered but I do not see the utility in this considering her last study was just two months prior. Will change this to a limited to just assess EF and to look for a pericardial effusion.   - overall, her symptoms  seem atypical for a cardiac etiology as they are more pleuritic in nature. She does have cardiac risk factors including HTN, tobacco use, and family history of CAD. Consider outpatient NST or Coronary CT if no acute findings are noted on her echo.   2. Recent PE/DVT - Diagnosed in 08/2016, thought to be provoked from a hospitalization in 07/2016 at which time she did not receive DVT prophylaxis per her report.  - repeat CTA and lower extremity dopplers negative for acute findings.  - unable to afford Eliquis but reports Xarelto was preferred by her insurance. Currently on Coumadin with Lovenox bridge but is having difficulty maintaining a therapeutic INR. Will request a Case Management consult to further investigate her co-pay for Xarelto as this would be a good option for the patient if financially feasible.   3. HTN - BP at 116/61-  167/94 this admission. On Clonidine 0.3mg  TID as an outpatient. Not a preferred medication but has been on this for years due to it helping with her migraine headaches. Continue current regimen.   4. Tobacco Use - reduced her use from 1-2 ppd to 2 packs per week. Congratulated on this with complete cessation advised.   Signed, Ellsworth Lennox, PA-C 10/24/2016, 9:25 AM Pager: [phone]

## 2016-10-24 NOTE — Progress Notes (Signed)
D/cing to home.All d/c instructions given w/ verbal understanding.awaiting ride.

## 2016-10-24 NOTE — Progress Notes (Signed)
D/c to home w/ s.o. Ambulating per request.

## 2016-10-24 NOTE — Discharge Summary (Signed)
Physician Discharge Summary  Dana Morrow ZOX:[id number] DOB: 28-Oct-1965 DOA: 10/23/2016  PCP: Jeoffrey Massed, MD  Admit date: 10/23/2016 Discharge date: 10/24/2016  Admitted From: Home Disposition: Home   Recommendations for Outpatient Follow-up:  1. Follow up with PCP in 1-2 weeks 2. Will need outpatient cardiac stress testing, appointment made for 10/24 with follow up appointment 11/1.  3. Please obtain BMP/CBC in one week  Home Health: None Equipment/Devices: None Discharge Condition: Stable CODE STATUS: Full Diet recommendation: Heart healthy  Brief/Interim Summary: Dana Morrow was recently admitted to [facility] from 8/2 - 08/09/2016 for evaluation of acute dyspnea and chest pain, found to have bilateral PE with no evidence of heart strain. Her CTA did mention calcifications along the LM, right, and left coronary arteries. Doppler studies showed an acute DVT involving the right popliteal vein. An echocardiogram was also obtained and showed a preserved EF of 55-60%, Grade 1 DD, no regional WMA, mild AI, and mild MR. She was discharged on Eliquis but switched to Coumadin with Lovenox bridging in the interim due to financial concerns. The plan was to continue with anticoagulation for 6 months as she had been hospitalized in [place] in 07/2016 for 6 days and did not receive Pharmacologic or Mechanical DVT prophylaxis per her report.   She had been doing well since recent hospital discharge but presented back to [facility] on 10/23/2016 for evaluation of chest pain. Reports she started having episodes of chest pain two days prior which would occur at rest and last for 30-60 minutes at a time. Pain was worse with coughing and no association with exertion. Yesterday morning, she noticed the pain upon waking and says it was constant throughout the day, lasting for over 24 hours. Notes the pain was still worse with coughing or deep breathing.   She denies any prior cardiac  history. Underwent stress testing in 2013 which was normal. She does have HTN and is a current tobacco user but recently reduced her use from 1 ppd to 2 packs per week.   Initial labs show WBC of 19.0, Hgb 15.2, platelets 284, Na+ 135, K+ 3.5, and BNP 121. INR 1.34. Initial and cyclic troponin values have been negative. EKG shows sinus tachycardia, HR 112, with no acute ST or T-wave changes.  CXR with no active cardiopulmonary disease. Lower extremity dopplers without evidence of a DVT. CTA showed no evidence of PE with minimal coronary calcifications.   Anticoagulation was switched to xarelto due to insurance coverage and subtherapeutic INR on coumadin. She will be scheduled for an outpatient stress test per cardiology consultants.   Discharge Diagnoses:  Active Problems:   Chronic pain syndrome   Essential hypertension   Pleuritic chest pain   Dehydration   Leukocytosis   Tobacco abuse  Chest pain: Atypical. Negative work up for ACS here.  - Will have outpatient stress testing for risk stratification per cardiology  DVT/PE: Dx Aug 2018, With resolution of clots on U/S and CTA here. INR subtherapeutic and pt would prefer NOAC. Says insurance covers xarelto, so she was given education regarding this per pharmacy and will receive 30-day card and prescription at discharge.   Chronic pain syndrome restart home medications Essential hypertension -stable continue home medication Leukocytosis: Possibly reactive demargination without evidence of infection. Recommend recheck at follow up and referral to hematology if persistent without explanation.  Tobacco use: Counseling cessation. Mildly polycythemic as a result. Will continue efforts at quitting.   Discharge Instructions Discharge Instructions  Diet - low sodium heart healthy    Complete by:  As directed    Discharge instructions    Complete by:  As directed    STOP taking warfarin and START taking xarelto 20mg  daily.  Follow up with  cardiology as below for stress testing If your symptoms return, seek medical attention right away.     Allergies as of 10/24/2016      Reactions   Flagyl [metronidazole] Nausea And Vomiting   Oral form only, IV pt can tolerate   Elavil [amitriptyline]    hallucinations   Klonopin [clonazepam]    Hallucinations   Levaquin [levofloxacin Hemihydrate] Hives      Medication List    STOP taking these medications   amoxicillin-clavulanate 875-125 MG tablet Commonly known as:  AUGMENTIN   warfarin 2.5 MG tablet Commonly known as:  COUMADIN     TAKE these medications   albuterol 108 (90 Base) MCG/ACT inhaler Commonly known as:  VENTOLIN HFA Inhale 1-2 puffs into the lungs every 6 (six) hours as needed for wheezing or shortness of breath.   cloNIDine 0.3 MG tablet Commonly known as:  CATAPRES TAKE ONE TABLET BY MOUTH 3 TIMES DAILY What changed:  See the new instructions.   HYDROmorphone 2 MG tablet Commonly known as:  DILAUDID Take 2 mg by mouth 4 (four) times daily as needed for moderate pain.   oxyCODONE-acetaminophen 5-325 MG tablet Commonly known as:  ROXICET Take 1 tablet by mouth every 6 (six) hours as needed. What changed:  reasons to take this   promethazine 12.5 MG tablet Commonly known as:  PHENERGAN 1-2 tabs po q6h prn nausea   rivaroxaban 20 MG Tabs tablet Commonly known as:  XARELTO Take 1 tablet (20 mg total) by mouth daily with supper.      Follow-up Information    Chrystie Nose, MD Follow up on 10/30/2016.   Specialty:  Cardiology Why:  Appointment for Treadmill Stress Test on 10/30/2016 at 7:30. Wear tennis shoes. Nothing to eat or drink after midnight except sips of water with medication.  Contact information: [address] [place] [address] [phone]        Ellsworth Lennox, PA-C Follow up on 11/07/2016.   Specialties:  Physician Assistant, Cardiology Why:  Cardiology Follow-Up on 11/07/2016 at 9:00AM.  Contact  information: [address] [place] [address] [phone]        Jeoffrey Massed, MD Follow up.   Specialty:  Family Medicine Contact information: [address] [place] [address] [phone]          Allergies  Allergen Reactions  . Flagyl [Metronidazole] Nausea And Vomiting    Oral form only, IV pt can tolerate  . Elavil [Amitriptyline]     hallucinations  . Klonopin [Clonazepam]     Hallucinations   . Levaquin [Levofloxacin Hemihydrate] Hives    Consultations:  Cardiology, [place]  Procedures/Studies: Dg Chest 2 View  Result Date: 10/23/2016 CLINICAL DATA:  Left-sided chest pain EXAM: CHEST  2 VIEW COMPARISON:  08/08/2016 FINDINGS: The heart size and mediastinal contours are within normal limits. Both lungs are clear. The visualized skeletal structures are unremarkable. IMPRESSION: No active cardiopulmonary disease. Electronically Signed   By: Alcide Clever M.D.   On: 10/23/2016 15:50   Ct Angio Chest Pe W And/or Wo Contrast  Result Date: 10/23/2016 CLINICAL DATA:  Left-sided chest pain EXAM: CT ANGIOGRAPHY CHEST WITH CONTRAST TECHNIQUE: Multidetector CT imaging of the  chest was performed using the standard protocol during bolus administration of intravenous contrast. Multiplanar CT image reconstructions and MIPs were obtained to evaluate the vascular anatomy. CONTRAST:  175 mL Isovue 370. There was an initial extravasation at the beginning of the procedure with approximately 75 mL of contrast extravasated into the right upper arm. Patient was counseled with regards to cool compresses as well as monitoring the site for any potential skin changes although based on the initial scout image, bruising and muscle soreness is likely more common. COMPARISON:  08/08/2016 FINDINGS: Cardiovascular: Thoracic aorta demonstrates mild atherosclerotic change without aneurysmal dilatation or dissection. No significant cardiac enlargement is noted. Minimal  coronary calcifications are seen. Pulmonary artery shows a normal branching pattern. No filling defects are identified to suggest pulmonary embolism. Mediastinum/Nodes: Thoracic inlet is within normal limits. The esophagus is within normal limits. A few small right peritracheal and right hilar lymph nodes are noted stable from previous exams dating back to 2017 and likely benign in etiology. Lungs/Pleura: Lungs are well aerated bilaterally. No focal infiltrate or sizable effusion is seen. Mild patchy atelectatic changes are noted in the left lung without focal confluent infiltrate. No sizable effusion is noted. Upper Abdomen: Changes of prior cholecystectomy are noted. Mild fullness of biliary tree is noted consistent with the post cholecystectomy state. No other focal abnormality is noted. Musculoskeletal: No acute bony abnormality is noted. Review of the MIP images confirms the above findings. IMPRESSION: No evidence of acute pulmonary emboli. The filling defects seen on the prior exam have resolved in the interval. No acute abnormality identified. Electronically Signed   By: Alcide CleverMark  Lukens M.D.   On: 10/23/2016 18:26   [place] Venous Img Lower Unilateral Left  Result Date: 10/23/2016 CLINICAL DATA:  Left leg pain for several hours, history of deep venous thrombosis on the right with current blood thinners EXAM: LEFT LOWER EXTREMITY VENOUS DOPPLER ULTRASOUND TECHNIQUE: Gray-scale sonography with graded compression, as well as color Doppler and duplex ultrasound were performed to evaluate the lower extremity deep venous systems from the level of the common femoral vein and including the common femoral, femoral, profunda femoral, popliteal and calf veins including the posterior tibial, peroneal and gastrocnemius veins when visible. The superficial great saphenous vein was also interrogated. Spectral Doppler was utilized to evaluate flow at rest and with distal augmentation maneuvers in the common femoral, femoral and  popliteal veins. COMPARISON:  08/08/2016 FINDINGS: Contralateral Common Femoral Vein: Respiratory phasicity is normal and symmetric with the symptomatic side. No evidence of thrombus. Normal compressibility. Common Femoral Vein: No evidence of thrombus. Normal compressibility, respiratory phasicity and response to augmentation. Saphenofemoral Junction: No evidence of thrombus. Normal compressibility and flow on color Doppler imaging. Profunda Femoral Vein: No evidence of thrombus. Normal compressibility and flow on color Doppler imaging. Femoral Vein: No evidence of thrombus. Normal compressibility, respiratory phasicity and response to augmentation. Popliteal Vein: No evidence of thrombus. Normal compressibility, respiratory phasicity and response to augmentation. Calf Veins: No evidence of thrombus. Normal compressibility and flow on color Doppler imaging. Superficial Great Saphenous Vein: No evidence of thrombus. Normal compressibility. Venous Reflux:  None. Other Findings:  None. IMPRESSION: No evidence of deep venous thrombosis. Electronically Signed   By: Alcide CleverMark  Lukens M.D.   On: 10/23/2016 17:08   Subjective: Chest pain resolved, no dyspnea. Wants to go home. No bleeding.   Discharge Exam: BP 116/61 (BP Location: Left Leg)   Pulse 71   Temp 98.2 F (36.8 C) (Oral)   Resp 18  Ht 5\' 3"  (1.6 m)   Wt 90.4 kg (199 lb 6.4 oz)   SpO2 98%   BMI 35.32 kg/m   General: Pt is alert, awake, not in acute distress Cardiovascular: RRR, S1/S2 +, no rubs, no gallops Respiratory: CTA bilaterally, no wheezing, no rhonchi Abdominal: Soft, NT, ND, bowel sounds + Extremities: No edema, no cyanosis  Labs: BNP (last 3 results)  Recent Labs  10/23/16 1521  BNP 121.9*   Basic Metabolic Panel:  Recent Labs Lab 10/23/16 1521  NA 135  K 3.5  CL 102  CO2 24  GLUCOSE 186*  BUN 16  CREATININE 0.81  CALCIUM 9.1   Liver Function Tests:  Recent Labs Lab 10/23/16 1521  AST 19  ALT 15  ALKPHOS 74   BILITOT 0.5  PROT 7.9  ALBUMIN 3.9   CBC:  Recent Labs Lab 10/23/16 1521  WBC 19.0*  NEUTROABS 14.8*  HGB 15.2*  HCT 43.2  MCV 90.9  PLT 284   Cardiac Enzymes:  Recent Labs Lab 10/23/16 1521 10/23/16 1915 10/24/16 0008 10/24/16 0256  TROPONINI <0.03 <0.03 <0.03 <0.03   Urinalysis    Component Value Date/Time   COLORURINE YELLOW 10/24/2016 0905   APPEARANCEUR CLEAR 10/24/2016 0905   LABSPEC >1.046 (H) 10/24/2016 0905   PHURINE 5.0 10/24/2016 0905   GLUCOSEU NEGATIVE 10/24/2016 0905   HGBUR NEGATIVE 10/24/2016 0905   BILIRUBINUR NEGATIVE 10/24/2016 0905   KETONESUR NEGATIVE 10/24/2016 0905   PROTEINUR NEGATIVE 10/24/2016 0905   UROBILINOGEN 0.2 10/03/2014 1220   NITRITE NEGATIVE 10/24/2016 0905   LEUKOCYTESUR NEGATIVE 10/24/2016 0905   Time coordinating discharge: Approximately 40 minutes  Hazeline Junker, MD  [facility] 10/24/2016, 1:35 PM Pager [phone]

## 2016-10-25 ENCOUNTER — Telehealth: Payer: Self-pay

## 2016-10-25 ENCOUNTER — Telehealth (HOSPITAL_COMMUNITY): Payer: Self-pay

## 2016-10-25 NOTE — Telephone Encounter (Signed)
Unable to reach patient at time of TCM Call.  Left message for patient to return call when available.   (Friday, 10/26 @ 11am currently being held for pt)

## 2016-10-25 NOTE — Telephone Encounter (Signed)
Encounter complete. 

## 2016-10-28 ENCOUNTER — Encounter: Payer: Self-pay | Admitting: Family Medicine

## 2016-10-28 NOTE — Telephone Encounter (Signed)
LM requesting call back to complete TCM and schedule hosp f/u (Friday, 10/26 @ 11am currently being held for pt).

## 2016-10-29 NOTE — Telephone Encounter (Signed)
Noted  

## 2016-10-29 NOTE — Telephone Encounter (Signed)
Transition Care Management Follow-up Telephone Call   Date discharged? 10/24/16   How have you been since you were released from the hospital? States she is doing good from hospital visit but currently has GI bug.    Do you understand why you were in the hospital? yes   Do you understand the discharge instructions? yes   Where were you discharged to? Home.    Items Reviewed:  Medications reviewed: yes  Allergies reviewed: yes  Dietary changes reviewed: yes  Referrals reviewed: yes   Functional Questionnaire:   Activities of Daily Living (ADLs):   She states they are independent in the following: ambulation, bathing and hygiene, feeding, continence, grooming, toileting and dressing States they require assistance with the following: None.    Any transportation issues/concerns?: no   Any patient concerns? Patient schedule for stress test tomorrow. Concerned about GI bug, if she can make it. Advised to call cardiology.    Confirmed importance and date/time of follow-up visits scheduled yes  Provider Appointment booked with PCP on Friday, 11/01/16 @ 11am.   Confirmed with patient if condition begins to worsen call PCP or go to the ER.  Patient was given the office number and encouraged to call back with question or concerns.  : yes

## 2016-10-30 ENCOUNTER — Ambulatory Visit (HOSPITAL_COMMUNITY): Admit: 2016-10-30 | Payer: Medicare Other | Attending: Student | Admitting: Student

## 2016-10-31 ENCOUNTER — Telehealth (HOSPITAL_COMMUNITY): Payer: Self-pay

## 2016-10-31 NOTE — Telephone Encounter (Signed)
Encounter complete. 

## 2016-11-01 ENCOUNTER — Inpatient Hospital Stay: Payer: Medicare Other | Admitting: Family Medicine

## 2016-11-01 DIAGNOSIS — Z0289 Encounter for other administrative examinations: Secondary | ICD-10-CM

## 2016-11-01 NOTE — Progress Notes (Deleted)
11/01/2016  CC: No chief complaint on file.   Patient is a 51 y.o. Caucasian female who presents for  hospital follow up, specifically [facility] face-to-face visit. Dates hospitalized: 10/17-10/18, 2018. Days since d/c from hospital: 8 days Patient was discharged from hospital to home. Reason for admission to hospital: acute SOB and CP--dx'd with pleuritic CP assoc with recent dx of PE a couple months earlier. Date of interactive (phone) contact with patient and/or caregiver: 10/29/16  I have reviewed patient's discharge summary plus pertinent specific notes, labs, and imaging from the hospitalization.    {current status of patient, symptoms, etc}  Medication reconciliation was done today and patient {is not} {is} taking meds as recommended by discharging hospitalist/specialist.    PMH:  Past Medical History:  Diagnosis Date  . Abdominal pain, chronic, left lower quadrant    a. s/p Laproscopy in 2004 w/o finding of source of discomfort.  . Anxiety and depression 2013   Lots of stress  . Bilateral pulmonary embolism (HCC) 08/08/2016   with right popliteal vein DVT--hospitalized and d/c'd home on eliquis.  . Chest pain    a. reportedly nl MV and echo 10/2011 - [place]  . Cholelithiasis    a. s/p cholecystectomy 10/2011  . Chronic pain disorder   . Colon polyps    a. s/p colonoscopy and polypectomy in 2003  . Diverticulosis    with ? of 'itis  . Family history of colon cancer    mother  . GERD (gastroesophageal reflux disease)   . Hiatal hernia    a. small by EGD 2006.  [name] History of amaurosis fugax 01/2011   L eye; saw neuro at [facility] (Dr. Johnell Comings).  Lab w/u and carotid doppler nl per old records.  . Hyperlipidemia    LDL 191 in 2013 per old records  . Hypertension   . Migraine   . RSD (reflex sympathetic dystrophy)    a. L shoulder--pain mgmt with Dr. Marlynn Perking at [facility] [place]  . Tobacco abuse     a. 50 pack year hx.  . Visual disturbance    a. 12/2011 Left eye visual changes->w/u for TIA->Carotid U/S:  No ICA stenosis; Head CT: No acute abnl; MRI/MRA: mild RPCA stenosis; Echo: EF 55-60%, Triv AI, No PFO.    PSH:  Past Surgical History:  Procedure Laterality Date  . CESAREAN SECTION  1990  . COLONOSCOPY  10/2001   Dr. Ewing Schlein; diverticulosis, int/ext hem, one small polyp removed.  . ENDOMETRIAL ABLATION  2011  . ESOPHAGOGASTRODUODENOSCOPY  02/2004   Dr. Ewing Schlein; normal  . LAPAROSCOPIC CHOLECYSTECTOMY  10/19/11  . LAPAROSCOPY  05/2002   Dr. Harmon Dun cause for LLQ abd pain found  . SHOULDER SURGERY Left 1997 &98  . TRANSTHORACIC ECHOCARDIOGRAM  08/09/2016   Normal except grd I DD.  [name] UMBILICAL HERNIA REPAIR  2014   with mesh    MEDS:  Outpatient Medications Prior to Visit  Medication Sig Dispense Refill  . albuterol (VENTOLIN HFA) 108 (90 Base) MCG/ACT inhaler Inhale 1-2 puffs into the lungs every 6 (six) hours as needed for wheezing or shortness of breath. (Patient not taking: Reported on 09/05/2016) 1 Inhaler 1  . cloNIDine (CATAPRES) 0.3 MG tablet TAKE ONE TABLET BY MOUTH 3 TIMES DAILY (Patient taking differently: TAKE O.3MG  BY MOUTH 3 TIMES DAILY) 90 tablet 0  . HYDROmorphone (DILAUDID) 2 MG tablet Take 2 mg by mouth 4 (four) times daily as needed for moderate pain.     [name]  oxyCODONE-acetaminophen (ROXICET) 5-325 MG tablet Take 1 tablet by mouth every 6 (six) hours as needed. (Patient taking differently: Take 1 tablet by mouth every 6 (six) hours as needed for moderate pain. ) 30 tablet 0  . promethazine (PHENERGAN) 12.5 MG tablet 1-2 tabs po q6h prn nausea 30 tablet 1  . rivaroxaban (XARELTO) 20 MG TABS tablet Take 1 tablet (20 mg total) by mouth daily with supper. 30 tablet 0   No facility-administered medications prior to visit.    EXAM: ***  Pertinent labs/imaging Lab Results  Component Value Date   WBC 19.0 (H) 10/23/2016   HGB 15.2 (H) 10/23/2016   HCT  43.2 10/23/2016   MCV 90.9 10/23/2016   PLT 284 10/23/2016     Chemistry      Component Value Date/Time   NA 135 10/23/2016 1521   K 3.5 10/23/2016 1521   CL 102 10/23/2016 1521   CO2 24 10/23/2016 1521   BUN 16 10/23/2016 1521   CREATININE 0.81 10/23/2016 1521      Component Value Date/Time   CALCIUM 9.1 10/23/2016 1521   ALKPHOS 74 10/23/2016 1521   AST 19 10/23/2016 1521   ALT 15 10/23/2016 1521   BILITOT 0.5 10/23/2016 1521     Lab Results  Component Value Date   INR 1.34 10/23/2016   INR 1.1 09/10/2016   INR 1.3 (H) 09/05/2016    ASSESSMENT/PLAN:  ***  {Medical decision making of moderate complexity was utilized today} 99495  {Medical decision making of high complexity was utilized today} 99496  FOLLOW UP:  ***

## 2016-11-05 ENCOUNTER — Telehealth (HOSPITAL_COMMUNITY): Payer: Self-pay | Admitting: Student

## 2016-11-05 ENCOUNTER — Ambulatory Visit (HOSPITAL_COMMUNITY)
Admission: RE | Admit: 2016-11-05 | Payer: Medicare Other | Source: Ambulatory Visit | Attending: Student | Admitting: Student

## 2016-11-05 NOTE — Progress Notes (Deleted)
Cardiology Office Note    Date:  11/05/2016   ID:  SHAUN ZUCCARO, DOB 1965-02-12, MRN [id number]  PCP:  Jeoffrey Massed, MD  Cardiologist:  ***   No chief complaint on file.   History of Present Illness:    Dana Morrow is a 51 y.o. female ***    Past Medical History:  Diagnosis Date  . Abdominal pain, chronic, left lower quadrant    a. s/p Laproscopy in 2004 w/o finding of source of discomfort.  . Anxiety and depression 2013   Lots of stress  . Bilateral pulmonary embolism (HCC) 08/08/2016   with right popliteal vein DVT--hospitalized and d/c'd home on eliquis.  . Chest pain    a. reportedly nl MV and echo 10/2011 - [place]  . Cholelithiasis    a. s/p cholecystectomy 10/2011  . Chronic pain disorder   . Colon polyps    a. s/p colonoscopy and polypectomy in 2003  . Diverticulosis    with ? of 'itis  . Family history of colon cancer    mother  . GERD (gastroesophageal reflux disease)   . Hiatal hernia    a. small by EGD 2006.  [name] History of amaurosis fugax 01/2011   L eye; saw neuro at [facility] (Dr. Johnell Comings).  Lab w/u and carotid doppler nl per old records.  . Hyperlipidemia    LDL 191 in 2013 per old records  . Hypertension   . Migraine   . RSD (reflex sympathetic dystrophy)    a. L shoulder--pain mgmt with Dr. Marlynn Perking at [facility] [place]  . Tobacco abuse    a. 50 pack year hx.  . Visual disturbance    a. 12/2011 Left eye visual changes->w/u for TIA->Carotid U/S:  No ICA stenosis; Head CT: No acute abnl; MRI/MRA: mild RPCA stenosis; Echo: EF 55-60%, Triv AI, No PFO.    Past Surgical History:  Procedure Laterality Date  . CESAREAN SECTION  1990  . COLONOSCOPY  10/2001   Dr. Ewing Schlein; diverticulosis, int/ext hem, one small polyp removed.  . ENDOMETRIAL ABLATION  2011  . ESOPHAGOGASTRODUODENOSCOPY  02/2004   Dr. Ewing Schlein; normal  . LAPAROSCOPIC CHOLECYSTECTOMY  10/19/11  . LAPAROSCOPY  05/2002   Dr.  Harmon Dun cause for LLQ abd pain found  . SHOULDER SURGERY Left 1997 &98  . TRANSTHORACIC ECHOCARDIOGRAM  08/09/2016   Normal except grd I DD.  [name] UMBILICAL HERNIA REPAIR  2014   with mesh    Current Medications: Outpatient Medications Prior to Visit  Medication Sig Dispense Refill  . albuterol (VENTOLIN HFA) 108 (90 Base) MCG/ACT inhaler Inhale 1-2 puffs into the lungs every 6 (six) hours as needed for wheezing or shortness of breath. (Patient not taking: Reported on 09/05/2016) 1 Inhaler 1  . cloNIDine (CATAPRES) 0.3 MG tablet TAKE ONE TABLET BY MOUTH 3 TIMES DAILY (Patient taking differently: TAKE O.3MG  BY MOUTH 3 TIMES DAILY) 90 tablet 0  . HYDROmorphone (DILAUDID) 2 MG tablet Take 2 mg by mouth 4 (four) times daily as needed for moderate pain.     [name] oxyCODONE-acetaminophen (ROXICET) 5-325 MG tablet Take 1 tablet by mouth every 6 (six) hours as needed. (Patient taking differently: Take 1 tablet by mouth every 6 (six) hours as needed for moderate pain. ) 30 tablet 0  . promethazine (PHENERGAN) 12.5 MG tablet 1-2 tabs po q6h prn nausea 30 tablet 1  . rivaroxaban (XARELTO) 20 MG TABS tablet Take 1 tablet (20 mg total) by  mouth daily with supper. 30 tablet 0   No facility-administered medications prior to visit.      Allergies:   Flagyl [metronidazole]; Elavil [amitriptyline]; Klonopin [clonazepam]; and Levaquin [levofloxacin hemihydrate]   Social History   Social History  . Marital status: Divorced    Spouse name: N/A  . Number of children: N/A  . Years of education: N/A   Social History Main Topics  . Smoking status: Current Every Day Smoker    Packs/day: 0.50    Years: 30.00    Types: Cigarettes  . Smokeless tobacco: Never Used  . Alcohol use No  . Drug use: No  . Sexual activity: Not on file   Other Topics Concern  . Not on file   Social History Narrative   Divorced, 1 son, 1 granddaughter.   Relocated to [place] from [place] in 04/2014 to live with her  boyfriend.     Had been walking 3 miles on a treadmill most days of the week until late October when she had cholecystectomy.   Tob 30 pack-yr hx.   Alc: none   No hx of substance abuse.     Family History:  The patient's ***family history includes Colon cancer in her mother; Dementia in her father; Esophageal cancer in her brother; Heart attack in her mother; Heart disease in her father and mother; Hyperlipidemia in her father and mother; Hypertension in her father and mother; Parkinson's disease in her father.   Review of Systems:   Please see the history of present illness.     General:  No chills, fever, night sweats or weight changes.  Cardiovascular:  No chest pain, dyspnea on exertion, edema, orthopnea, palpitations, paroxysmal nocturnal dyspnea. Dermatological: No rash, lesions/masses Respiratory: No cough, dyspnea Urologic: No hematuria, dysuria Abdominal:   No nausea, vomiting, diarrhea, bright red blood per rectum, melena, or hematemesis Neurologic:  No visual changes, wkns, changes in mental status. All other systems reviewed and are otherwise negative except as noted above.   Physical Exam:    VS:  There were no vitals taken for this visit.   General: Well developed, well nourished,female appearing in no acute distress. Head: Normocephalic, atraumatic, sclera non-icteric, no xanthomas, nares are without discharge.  Neck: No carotid bruits. JVD not elevated.  Lungs: Respirations regular and unlabored, without wheezes or rales.  Heart: ***Regular rate and rhythm. No S3 or S4.  No murmur, no rubs, or gallops appreciated. Abdomen: Soft, non-tender, non-distended with normoactive bowel sounds. No hepatomegaly. No rebound/guarding. No obvious abdominal masses. Msk:  Strength and tone appear normal for age. No joint deformities or effusions. Extremities: No clubbing or cyanosis. No edema.  Distal pedal pulses are 2+ bilaterally. Neuro: Alert and oriented X 3. Moves all  extremities spontaneously. No focal deficits noted. Psych:  Responds to questions appropriately with a normal affect. Skin: No rashes or lesions noted  Wt Readings from Last 3 Encounters:  10/23/16 199 lb 6.4 oz (90.4 kg)  09/05/16 194 lb (88 kg)  09/05/16 197 lb (89.4 kg)        Studies/Labs Reviewed:   EKG:  EKG is*** ordered today.  The ekg ordered today demonstrates ***  Recent Labs: 10/23/2016: ALT 15; B Natriuretic Peptide 121.9; BUN 16; Creatinine, Ser 0.81; Hemoglobin 15.2; Platelets 284; Potassium 3.5; Sodium 135   Lipid Panel    Component Value Date/Time   CHOL 228 (H) 12/18/2011 2115   TRIG 190 (H) 12/18/2011 2115   HDL 40 12/18/2011 2115   CHOLHDL 5.7  12/18/2011 2115   VLDL 38 12/18/2011 2115   LDLCALC 150 (H) 12/18/2011 2115    Additional studies/ records that were reviewed today include:  ***  Assessment:    No diagnosis found.   Plan:   In order of problems listed above:  1. ***    Medication Adjustments/Labs and Tests Ordered: Current medicines are reviewed at length with the patient today.  Concerns regarding medicines are outlined above.  Medication changes, Labs and Tests ordered today are listed in the Patient Instructions below. There are no Patient Instructions on file for this visit.   Signed, Ellsworth LennoxBrittany M Micheal Murad, PA-C  11/05/2016 8:12 PM    [facility] [address], [address] [place], [place]  [address] Phone: [phone]; Fax: [phone]  [address], [address] [place], [place] [address] Phone: [phone]

## 2016-11-07 ENCOUNTER — Ambulatory Visit: Payer: Medicare Other | Admitting: Student

## 2016-11-07 ENCOUNTER — Other Ambulatory Visit: Payer: Self-pay | Admitting: Family Medicine

## 2016-11-13 DIAGNOSIS — M25512 Pain in left shoulder: Secondary | ICD-10-CM | POA: Diagnosis not present

## 2016-11-13 DIAGNOSIS — Z79899 Other long term (current) drug therapy: Secondary | ICD-10-CM | POA: Diagnosis not present

## 2016-11-13 DIAGNOSIS — M79602 Pain in left arm: Secondary | ICD-10-CM | POA: Diagnosis not present

## 2016-11-13 DIAGNOSIS — Z7901 Long term (current) use of anticoagulants: Secondary | ICD-10-CM | POA: Diagnosis not present

## 2016-11-13 DIAGNOSIS — M5416 Radiculopathy, lumbar region: Secondary | ICD-10-CM | POA: Diagnosis not present

## 2016-11-13 DIAGNOSIS — G8929 Other chronic pain: Secondary | ICD-10-CM | POA: Diagnosis not present

## 2016-11-13 DIAGNOSIS — I82409 Acute embolism and thrombosis of unspecified deep veins of unspecified lower extremity: Secondary | ICD-10-CM | POA: Diagnosis not present

## 2016-11-14 NOTE — Progress Notes (Signed)
Cardiology Office Note    Date:  11/15/2016   ID:  Dana Morrow, DOB [birth year], MRN [id number]  PCP:  Jeoffrey Massed, MD  Cardiologist: Seen remotely by Dr. [place] in 2013; The patient wishes to follow with Dr. Rennis Golden   Chief Complaint  Patient presents with  . Hospitalization Follow-up    History of Present Illness:    Dana Morrow is a 51 y.o. female with past medical history of bilateral PE (diagnosed in 08/2016 following recent hospitalization), chronic pain syndrome, HTN, and tobacco use who presents to the office today for hospital follow-up.   The patient was recently admitted to [facility] in 10/2016 for evaluation of chest pain which was lasting for 30-60 minutes at a time. Pain was worse with coughing or deep breathing. Her EKG showed no acute ischemic changes and cyclic troponin values were negative. Echocardiogram was performed which showed a preserved EF of 65-70%, grade 1 diastolic dysfunction, and no significant valve abnormalities. An outpatient stress test was recommended but this has not yet been performed due to the patient having to reschedule.  In talking with the patient today, she reports overall doing well since her hospitalization.  She denies any repeat episodes of acute dyspnea. She has experienced chest tightness which usually occurs while she is sitting on the couch. This can last from 30-45 minutes and she notes associated nausea at that time. Has experienced one episode which occurred with activity. She denies any recent orthopnea, PND, lower extremity edema, palpitations or pre-syncope.   The patient remains on Xarelto for anticoagulation and denies any evidence of active bleeding.   She was previously smoking 1.5 ppd but has reduced this to 4 cigarettes per day by consuming carrot sticks throughout the day.    Past Medical History:  Diagnosis Date  . Abdominal pain, chronic, left lower quadrant    a. s/p Laproscopy in 2004 w/o finding of  source of discomfort.  . Anxiety and depression 2013   Lots of stress  . Bilateral pulmonary embolism (HCC) 08/08/2016   with right popliteal vein DVT--hospitalized and d/c'd home on eliquis.  . Chest pain    a. reportedly nl MV and echo 10/2011 - [place]  . Cholelithiasis    a. s/p cholecystectomy 10/2011  . Chronic pain disorder   . Colon polyps    a. s/p colonoscopy and polypectomy in 2003  . Diverticulosis    with ? of 'itis  . Family history of colon cancer    mother  . GERD (gastroesophageal reflux disease)   . Hiatal hernia    a. small by EGD 2006.  [name] History of amaurosis fugax 01/2011   L eye; saw neuro at [facility] (Dr. Johnell Comings).  Lab w/u and carotid doppler nl per old records.  . Hyperlipidemia    LDL 191 in 2013 per old records  . Hypertension   . Migraine   . RSD (reflex sympathetic dystrophy)    a. L shoulder--pain mgmt with Dr. Marlynn Perking at [facility] [place]  . Tobacco abuse    a. 50 pack year hx.  . Visual disturbance    a. 12/2011 Left eye visual changes->w/u for TIA->Carotid U/S:  No ICA stenosis; Head CT: No acute abnl; MRI/MRA: mild RPCA stenosis; Echo: EF 55-60%, Triv AI, No PFO.    Past Surgical History:  Procedure Laterality Date  . CESAREAN SECTION  1990  . COLONOSCOPY  10/2001   Dr. Ewing Schlein; diverticulosis, int/ext hem,  one small polyp removed.  . ENDOMETRIAL ABLATION  2011  . ESOPHAGOGASTRODUODENOSCOPY  02/2004   Dr. Ewing Schlein; normal  . LAPAROSCOPIC CHOLECYSTECTOMY  10/19/11  . LAPAROSCOPY  05/2002   Dr. Harmon Dun cause for LLQ abd pain found  . SHOULDER SURGERY Left 1997 &98  . TRANSTHORACIC ECHOCARDIOGRAM  08/09/2016   Normal except grd I DD.  [name] UMBILICAL HERNIA REPAIR  2014   with mesh    Current Medications: Outpatient Medications Prior to Visit  Medication Sig Dispense Refill  . cloNIDine (CATAPRES) 0.3 MG tablet TAKE ONE TABLET BY MOUTH 3 TIMES DAILY 90 tablet 1  . HYDROmorphone  (DILAUDID) 2 MG tablet Take 2 mg by mouth 4 (four) times daily as needed for moderate pain.     [name] oxyCODONE-acetaminophen (ROXICET) 5-325 MG tablet Take 1 tablet by mouth every 6 (six) hours as needed. (Patient taking differently: Take 1 tablet by mouth every 6 (six) hours as needed for moderate pain. ) 30 tablet 0  . rivaroxaban (XARELTO) 20 MG TABS tablet Take 1 tablet (20 mg total) by mouth daily with supper. 30 tablet 0  . albuterol (VENTOLIN HFA) 108 (90 Base) MCG/ACT inhaler Inhale 1-2 puffs into the lungs every 6 (six) hours as needed for wheezing or shortness of breath. (Patient not taking: Reported on 09/05/2016) 1 Inhaler 1  . promethazine (PHENERGAN) 12.5 MG tablet 1-2 tabs po q6h prn nausea 30 tablet 1   No facility-administered medications prior to visit.      Allergies:   Flagyl [metronidazole]; Elavil [amitriptyline]; Klonopin [clonazepam]; and Levaquin [levofloxacin hemihydrate]   Social History   Socioeconomic History  . Marital status: Divorced    Spouse name: None  . Number of children: None  . Years of education: None  . Highest education level: None  Social Needs  . Financial resource strain: None  . Food insecurity - worry: None  . Food insecurity - inability: None  . Transportation needs - medical: None  . Transportation needs - non-medical: None  Occupational History  . None  Tobacco Use  . Smoking status: Current Every Day Smoker    Packs/day: 0.50    Years: 30.00    Pack years: 15.00    Types: Cigarettes  . Smokeless tobacco: Never Used  Substance and Sexual Activity  . Alcohol use: No  . Drug use: No  . Sexual activity: None  Other Topics Concern  . None  Social History Narrative   Divorced, 1 son, 1 granddaughter.   Relocated to [place] from [place] in 04/2014 to live with her boyfriend.     Had been walking 3 miles on a treadmill most days of the week until late October when she had cholecystectomy.   Tob 30 pack-yr hx.   Alc: none   No  hx of substance abuse.     Family History:  The patient's family history includes Colon cancer in her mother; Dementia in her father; Esophageal cancer in her brother; Heart attack in her mother; Heart disease in her father and mother; Hyperlipidemia in her father and mother; Hypertension in her father and mother; Parkinson's disease in her father.   Review of Systems:   Please see the history of present illness.     General:  No chills, fever, night sweats or weight changes.  Cardiovascular:  No dyspnea on exertion, edema, orthopnea, palpitations, paroxysmal nocturnal dyspnea. Positive for chest pain.  Dermatological: No rash, lesions/masses Respiratory: No cough, dyspnea Urologic: No hematuria, dysuria Abdominal:   No  nausea, vomiting, diarrhea, bright red blood per rectum, melena, or hematemesis Neurologic:  No visual changes, wkns, changes in mental status. All other systems reviewed and are otherwise negative except as noted above.   Physical Exam:    VS:  BP 138/84   Pulse 86   Ht 5\' 3"  (1.6 m)   Wt 200 lb (90.7 kg)   BMI 35.43 kg/m    General: Well developed, well nourished Caucasian female appearing in no acute distress. Head: Normocephalic, atraumatic, sclera non-icteric, no xanthomas, nares are without discharge.  Neck: No carotid bruits. JVD not elevated.  Lungs: Respirations regular and unlabored, without wheezes or rales.  Heart: Regular rate and rhythm. No S3 or S4.  No murmur, no rubs, or gallops appreciated. Abdomen: Soft, non-tender, non-distended with normoactive bowel sounds. No hepatomegaly. No rebound/guarding. No obvious abdominal masses. Msk:  Strength and tone appear normal for age. No joint deformities or effusions. Extremities: No clubbing or cyanosis. No lower extremity edema.  Distal pedal pulses are 2+ bilaterally. Neuro: Alert and oriented X 3. Moves all extremities spontaneously. No focal deficits noted. Psych:  Responds to questions appropriately  with a normal affect. Skin: No rashes or lesions noted  Wt Readings from Last 3 Encounters:  11/15/16 200 lb (90.7 kg)  10/23/16 199 lb 6.4 oz (90.4 kg)  09/05/16 194 lb (88 kg)    Studies/Labs Reviewed:   EKG:  EKG is not ordered today. EKG from 10/24/2016 is reviewed which shows NSR, HR 73, with no acute ST or T-wave changes.   Recent Labs: 10/23/2016: ALT 15; B Natriuretic Peptide 121.9; BUN 16; Creatinine, Ser 0.81; Hemoglobin 15.2; Platelets 284; Potassium 3.5; Sodium 135   Lipid Panel    Component Value Date/Time   CHOL 228 (H) 12/18/2011 2115   TRIG 190 (H) 12/18/2011 2115   HDL 40 12/18/2011 2115   CHOLHDL 5.7 12/18/2011 2115   VLDL 38 12/18/2011 2115   LDLCALC 150 (H) 12/18/2011 2115    Additional studies/ records that were reviewed today include:   Echocardiogram: 10/2016 Study Conclusions  - Left ventricle: The cavity size was normal. There was mild   concentric hypertrophy. Systolic function was vigorous. The   estimated ejection fraction was in the range of 65% to 70%. Wall   motion was normal; there were no regional wall motion   abnormalities. Doppler parameters are consistent with abnormal   left ventricular relaxation (grade 1 diastolic dysfunction).   Doppler parameters are consistent with elevated ventricular   end-diastolic filling pressure. - Aortic valve: Trileaflet; normal thickness leaflets. There was no   regurgitation. - Aortic root: The aortic root was normal in size. - Mitral valve: Calcified annulus. Mildly thickened leaflets . - Left atrium: The atrium was normal in size. - Right ventricle: The cavity size was normal. Wall thickness was   normal. Systolic function was normal. - Right atrium: The atrium was normal in size. - Tricuspid valve: There was no regurgitation. - Pulmonary arteries: Systolic pressure could not be accurately   estimated. - Inferior vena cava: The vessel was normal in size. - Pericardium, extracardiac: There was  no pericardial effusion.   Assessment:    1. Atypical chest pain   2. Bilateral pulmonary embolism (HCC)   3. Essential hypertension   4. Tobacco abuse      Plan:   In order of problems listed above:  1. Atypical Chest Pain - the patient was hospitalized in 10/2016 for evaluation of chest pain which was lasting for  30-60 minutes at a time and worse with coughing or deep breathing. Her EKG showed no acute ischemic changes and cyclic troponin values were negative. CTA negative for recurrent PE. Echocardiogram showed a preserved EF of 65-70%, Grade 1 DD, and no significant valve abnormalities. An outpatient stress test was recommended but this has not yet been performed due to the patient having to reschedule. - she continues to have episodes of chest pain which occur at rest and last for 30-45 minutes.  - she denies any prior cardiac history but does have cardiac risk factors including HTN, tobacco use, and family history of CAD. Continue with plans to obtain a Treadmill Myoview for ischemic evaluation.   2. Bilateral Pulmonary Emboli - this was diagnosed in 08/2016 following a recent hospitalization. She was initially on Coumadin but had difficulty maintaining a therapeutic INR and was switched to Xarelto.  - she denies any evidence of active bleeding. Continue with full-dose anticoagulation.   3. HTN - BP is well-controlled ay 138/84 during today's visit. - continue Clonidine 0.3mg  TID (placed on this by PCP due to history of concurrent migraines).   4. Tobacco Use - she has reduced her tobacco use from 1.5 ppd to 4 cigarettes per day. Congratulated on this with complete cessation advised.    Medication Adjustments/Labs and Tests Ordered: Current medicines are reviewed at length with the patient today.  Concerns regarding medicines are outlined above.  Medication changes, Labs and Tests ordered today are listed in the Patient Instructions below. Patient Instructions  Medication  Instructions:   NO CHANGE  Testing/Procedures:  Your physician has requested that you have en exercise stress myoview. For further information please visit [email or web address]. Please follow instruction sheet, as given.  TAKE ALL MEDICATIONS  Follow-Up:  Your physician wants you to follow-up in: 6 MONTHS WITH DR HILTY You will receive a reminder letter in the mail two months in advance. If you don't receive a letter, please call our office to schedule the follow-up appointment.   If you need a refill on your cardiac medications before your next appointment, please call your pharmacy.     Signed, Ellsworth LennoxBrittany M Erian Lariviere, PA-C  11/15/2016 12:43 PM    [facility] [address] [place], [place]  [address] Phone: [phone]([phone]; Fax: [phone]) [phone]  [address] [place], [place] [address] Phone: [phone]

## 2016-11-15 ENCOUNTER — Ambulatory Visit (INDEPENDENT_AMBULATORY_CARE_PROVIDER_SITE_OTHER): Payer: Medicare Other | Admitting: Student

## 2016-11-15 ENCOUNTER — Encounter: Payer: Self-pay | Admitting: Student

## 2016-11-15 VITALS — BP 138/84 | HR 86 | Ht 63.0 in | Wt 200.0 lb

## 2016-11-15 DIAGNOSIS — I10 Essential (primary) hypertension: Secondary | ICD-10-CM

## 2016-11-15 DIAGNOSIS — Z720 Tobacco use: Secondary | ICD-10-CM | POA: Diagnosis not present

## 2016-11-15 DIAGNOSIS — R0789 Other chest pain: Secondary | ICD-10-CM | POA: Diagnosis not present

## 2016-11-15 DIAGNOSIS — I2699 Other pulmonary embolism without acute cor pulmonale: Secondary | ICD-10-CM

## 2016-11-15 DIAGNOSIS — I2510 Atherosclerotic heart disease of native coronary artery without angina pectoris: Secondary | ICD-10-CM

## 2016-11-15 DIAGNOSIS — I2584 Coronary atherosclerosis due to calcified coronary lesion: Secondary | ICD-10-CM

## 2016-11-15 NOTE — Patient Instructions (Signed)
Medication Instructions:   NO CHANGE  Testing/Procedures:  Your physician has requested that you have en exercise stress myoview. For further information please visit [email or web address]. Please follow instruction sheet, as given.  TAKE ALL MEDICATIONS  Follow-Up:  Your physician wants you to follow-up in: 6 MONTHS WITH DR HILTY You will receive a reminder letter in the mail two months in advance. If you don't receive a letter, please call our office to schedule the follow-up appointment.   If you need a refill on your cardiac medications before your next appointment, please call your pharmacy.

## 2016-11-15 NOTE — Telephone Encounter (Signed)
User: Dana Morrow, Dana Morrow Date/time: 11/06/16 3:38 PM  Comment: Called pt and lmsg for her to CB to get rescheduled for myoview that was missed.   Context:  Outcome: Left Message  Phone number: [phone] Phone Type: Home Phone  Comm. type: Telephone Call type: Outgoing  Contact: Dana Morrow, Dana Morrow Relation to patient: Self    User: Dana Morrow, Dana Morrow Date/time: 11/05/16 9:53 AM  Comment: Called pt and lmsg for her to CB to r/s myoview that was missed today.   Context:  Outcome: Left Message  Phone number: [phone] Phone Type: Home Phone  Comm. type: Telephone Call type: Outgoing  Contact: Dana Morrow, Dana Morrow Relation to patient: Self

## 2016-11-20 ENCOUNTER — Telehealth (HOSPITAL_COMMUNITY): Payer: Self-pay

## 2016-11-20 NOTE — Telephone Encounter (Signed)
Encounter complete. 

## 2016-11-22 ENCOUNTER — Ambulatory Visit (HOSPITAL_COMMUNITY)
Admission: RE | Admit: 2016-11-22 | Payer: Medicare Other | Source: Ambulatory Visit | Attending: Student | Admitting: Student

## 2016-12-01 DIAGNOSIS — D72829 Elevated white blood cell count, unspecified: Secondary | ICD-10-CM | POA: Diagnosis not present

## 2016-12-01 DIAGNOSIS — K5790 Diverticulosis of intestine, part unspecified, without perforation or abscess without bleeding: Secondary | ICD-10-CM | POA: Diagnosis not present

## 2016-12-01 DIAGNOSIS — I10 Essential (primary) hypertension: Secondary | ICD-10-CM | POA: Diagnosis not present

## 2016-12-01 DIAGNOSIS — F17210 Nicotine dependence, cigarettes, uncomplicated: Secondary | ICD-10-CM | POA: Diagnosis not present

## 2016-12-01 DIAGNOSIS — Z79899 Other long term (current) drug therapy: Secondary | ICD-10-CM | POA: Diagnosis not present

## 2016-12-01 DIAGNOSIS — Z7901 Long term (current) use of anticoagulants: Secondary | ICD-10-CM | POA: Diagnosis not present

## 2016-12-01 DIAGNOSIS — R1032 Left lower quadrant pain: Secondary | ICD-10-CM | POA: Diagnosis not present

## 2016-12-01 DIAGNOSIS — R935 Abnormal findings on diagnostic imaging of other abdominal regions, including retroperitoneum: Secondary | ICD-10-CM | POA: Diagnosis not present

## 2016-12-01 DIAGNOSIS — Z86711 Personal history of pulmonary embolism: Secondary | ICD-10-CM | POA: Diagnosis not present

## 2016-12-01 DIAGNOSIS — Z888 Allergy status to other drugs, medicaments and biological substances status: Secondary | ICD-10-CM | POA: Diagnosis not present

## 2016-12-01 DIAGNOSIS — Z86718 Personal history of other venous thrombosis and embolism: Secondary | ICD-10-CM | POA: Diagnosis not present

## 2016-12-01 DIAGNOSIS — K449 Diaphragmatic hernia without obstruction or gangrene: Secondary | ICD-10-CM | POA: Diagnosis not present

## 2016-12-01 DIAGNOSIS — R079 Chest pain, unspecified: Secondary | ICD-10-CM | POA: Diagnosis not present

## 2016-12-07 ENCOUNTER — Encounter: Payer: Self-pay | Admitting: Family Medicine

## 2016-12-12 DIAGNOSIS — M25512 Pain in left shoulder: Secondary | ICD-10-CM | POA: Diagnosis not present

## 2016-12-12 DIAGNOSIS — M79602 Pain in left arm: Secondary | ICD-10-CM | POA: Diagnosis not present

## 2016-12-12 DIAGNOSIS — Z79899 Other long term (current) drug therapy: Secondary | ICD-10-CM | POA: Diagnosis not present

## 2016-12-12 DIAGNOSIS — G8929 Other chronic pain: Secondary | ICD-10-CM | POA: Diagnosis not present

## 2016-12-12 DIAGNOSIS — M5416 Radiculopathy, lumbar region: Secondary | ICD-10-CM | POA: Diagnosis not present

## 2016-12-13 ENCOUNTER — Telehealth (HOSPITAL_COMMUNITY): Payer: Self-pay | Admitting: Student

## 2016-12-13 NOTE — Telephone Encounter (Signed)
12/04/16 Called pt and lmsg for her to CB to r/s myoview that was missed on 11/16..RG 12/14/16 Called pt and lmsg for her to CB to get r/s for myoview..RG Patient cxed appts on 10/24,10/30, and n/sed on 11/16.[name].RG  She will be removed from the workqueue

## 2016-12-25 DIAGNOSIS — R079 Chest pain, unspecified: Secondary | ICD-10-CM | POA: Diagnosis not present

## 2016-12-25 DIAGNOSIS — Z888 Allergy status to other drugs, medicaments and biological substances status: Secondary | ICD-10-CM | POA: Diagnosis not present

## 2016-12-25 DIAGNOSIS — F17210 Nicotine dependence, cigarettes, uncomplicated: Secondary | ICD-10-CM | POA: Diagnosis not present

## 2016-12-25 DIAGNOSIS — Z800 Family history of malignant neoplasm of digestive organs: Secondary | ICD-10-CM | POA: Diagnosis not present

## 2016-12-25 DIAGNOSIS — G894 Chronic pain syndrome: Secondary | ICD-10-CM | POA: Diagnosis not present

## 2016-12-25 DIAGNOSIS — G90512 Complex regional pain syndrome I of left upper limb: Secondary | ICD-10-CM | POA: Diagnosis not present

## 2016-12-25 DIAGNOSIS — F1190 Opioid use, unspecified, uncomplicated: Secondary | ICD-10-CM | POA: Insufficient documentation

## 2016-12-25 DIAGNOSIS — M79605 Pain in left leg: Secondary | ICD-10-CM | POA: Diagnosis not present

## 2016-12-25 DIAGNOSIS — E785 Hyperlipidemia, unspecified: Secondary | ICD-10-CM | POA: Diagnosis not present

## 2016-12-25 DIAGNOSIS — Z86718 Personal history of other venous thrombosis and embolism: Secondary | ICD-10-CM | POA: Diagnosis not present

## 2016-12-25 DIAGNOSIS — I10 Essential (primary) hypertension: Secondary | ICD-10-CM | POA: Diagnosis not present

## 2016-12-25 DIAGNOSIS — F419 Anxiety disorder, unspecified: Secondary | ICD-10-CM | POA: Diagnosis not present

## 2016-12-25 DIAGNOSIS — R072 Precordial pain: Secondary | ICD-10-CM | POA: Diagnosis not present

## 2016-12-25 DIAGNOSIS — R110 Nausea: Secondary | ICD-10-CM | POA: Diagnosis not present

## 2016-12-25 DIAGNOSIS — Z883 Allergy status to other anti-infective agents status: Secondary | ICD-10-CM | POA: Diagnosis not present

## 2016-12-25 DIAGNOSIS — Z79899 Other long term (current) drug therapy: Secondary | ICD-10-CM | POA: Diagnosis not present

## 2016-12-25 DIAGNOSIS — I2699 Other pulmonary embolism without acute cor pulmonale: Secondary | ICD-10-CM | POA: Diagnosis not present

## 2016-12-25 DIAGNOSIS — Z86711 Personal history of pulmonary embolism: Secondary | ICD-10-CM | POA: Diagnosis not present

## 2016-12-25 DIAGNOSIS — Z7901 Long term (current) use of anticoagulants: Secondary | ICD-10-CM | POA: Diagnosis not present

## 2016-12-25 DIAGNOSIS — R0602 Shortness of breath: Secondary | ICD-10-CM | POA: Diagnosis not present

## 2016-12-25 DIAGNOSIS — M79602 Pain in left arm: Secondary | ICD-10-CM | POA: Diagnosis not present

## 2016-12-26 DIAGNOSIS — R079 Chest pain, unspecified: Secondary | ICD-10-CM | POA: Diagnosis not present

## 2016-12-28 MED ORDER — ANTACID & ANTIGAS 200-200-20 MG/5ML PO SUSP
30.00 | ORAL | Status: DC
Start: ? — End: 2016-12-28

## 2016-12-28 MED ORDER — POLYETHYLENE GLYCOL 3350 17 G PO PACK
17.00 | PACK | ORAL | Status: DC
Start: ? — End: 2016-12-28

## 2016-12-28 MED ORDER — ASPIRIN EC 81 MG PO TBEC
81.00 | DELAYED_RELEASE_TABLET | ORAL | Status: DC
Start: 2016-12-27 — End: 2016-12-28

## 2016-12-28 MED ORDER — LORAZEPAM 2 MG/ML IJ SOLN
.50 | INTRAMUSCULAR | Status: DC
Start: ? — End: 2016-12-28

## 2016-12-28 MED ORDER — HYDRALAZINE HCL 20 MG/ML IJ SOLN
10.00 | INTRAMUSCULAR | Status: DC
Start: ? — End: 2016-12-28

## 2016-12-28 MED ORDER — METOPROLOL SUCCINATE ER 100 MG PO TB24
100.00 | ORAL_TABLET | ORAL | Status: DC
Start: 2016-12-27 — End: 2016-12-28

## 2016-12-28 MED ORDER — GENERIC EXTERNAL MEDICATION
Status: DC
Start: ? — End: 2016-12-28

## 2016-12-28 MED ORDER — NITROGLYCERIN 2 % TD OINT
TOPICAL_OINTMENT | TRANSDERMAL | Status: DC
Start: 2016-12-26 — End: 2016-12-28

## 2016-12-28 MED ORDER — ACETAMINOPHEN 325 MG PO TABS
650.00 | ORAL_TABLET | ORAL | Status: DC
Start: ? — End: 2016-12-28

## 2016-12-28 MED ORDER — ALBUTEROL SULFATE (2.5 MG/3ML) 0.083% IN NEBU
2.50 | INHALATION_SOLUTION | RESPIRATORY_TRACT | Status: DC
Start: ? — End: 2016-12-28

## 2016-12-28 MED ORDER — GENERIC EXTERNAL MEDICATION
.30 | Status: DC
Start: 2016-12-26 — End: 2016-12-28

## 2016-12-28 MED ORDER — HYDROMORPHONE HCL 2 MG PO TABS
2.00 | ORAL_TABLET | ORAL | Status: DC
Start: ? — End: 2016-12-28

## 2016-12-28 MED ORDER — SODIUM CHLORIDE 0.9 % IV SOLN
INTRAVENOUS | Status: DC
Start: ? — End: 2016-12-28

## 2016-12-28 MED ORDER — RIVAROXABAN 10 MG PO TABS
20.00 | ORAL_TABLET | ORAL | Status: DC
Start: 2016-12-26 — End: 2016-12-28

## 2016-12-28 MED ORDER — GENERIC EXTERNAL MEDICATION
0.30 | Status: DC
Start: 2016-12-26 — End: 2016-12-28

## 2016-12-28 MED ORDER — OXYCODONE HCL 5 MG PO TABS
5.00 | ORAL_TABLET | ORAL | Status: DC
Start: ? — End: 2016-12-28

## 2016-12-28 MED ORDER — NITROGLYCERIN 0.4 MG SL SUBL
.40 | SUBLINGUAL_TABLET | SUBLINGUAL | Status: DC
Start: ? — End: 2016-12-28

## 2016-12-28 MED ORDER — GUAIFENESIN 100 MG/5ML PO LIQD
200.00 | ORAL | Status: DC
Start: ? — End: 2016-12-28

## 2017-01-08 ENCOUNTER — Other Ambulatory Visit: Payer: Self-pay | Admitting: Family Medicine

## 2017-01-08 ENCOUNTER — Other Ambulatory Visit: Payer: Self-pay

## 2017-01-08 ENCOUNTER — Emergency Department (HOSPITAL_BASED_OUTPATIENT_CLINIC_OR_DEPARTMENT_OTHER): Payer: Medicare HMO

## 2017-01-08 ENCOUNTER — Encounter (HOSPITAL_BASED_OUTPATIENT_CLINIC_OR_DEPARTMENT_OTHER): Payer: Self-pay | Admitting: Emergency Medicine

## 2017-01-08 ENCOUNTER — Emergency Department (HOSPITAL_BASED_OUTPATIENT_CLINIC_OR_DEPARTMENT_OTHER)
Admission: EM | Admit: 2017-01-08 | Discharge: 2017-01-08 | Disposition: A | Discharge status: Home / Self Care | Payer: Medicare HMO | Attending: Emergency Medicine | Admitting: Emergency Medicine

## 2017-01-08 DIAGNOSIS — N3001 Acute cystitis with hematuria: Secondary | ICD-10-CM | POA: Diagnosis not present

## 2017-01-08 DIAGNOSIS — Z7901 Long term (current) use of anticoagulants: Secondary | ICD-10-CM | POA: Insufficient documentation

## 2017-01-08 DIAGNOSIS — F17210 Nicotine dependence, cigarettes, uncomplicated: Secondary | ICD-10-CM | POA: Diagnosis not present

## 2017-01-08 DIAGNOSIS — Z79899 Other long term (current) drug therapy: Secondary | ICD-10-CM | POA: Insufficient documentation

## 2017-01-08 DIAGNOSIS — R1030 Lower abdominal pain, unspecified: Secondary | ICD-10-CM | POA: Diagnosis present

## 2017-01-08 DIAGNOSIS — I10 Essential (primary) hypertension: Secondary | ICD-10-CM | POA: Insufficient documentation

## 2017-01-08 DIAGNOSIS — N3090 Cystitis, unspecified without hematuria: Secondary | ICD-10-CM

## 2017-01-08 LAB — CBC WITH DIFFERENTIAL/PLATELET
BASOS ABS: 0.1 10*3/uL (ref 0.0–0.1)
BASOS PCT: 1 %
EOS ABS: 0.2 10*3/uL (ref 0.0–0.7)
EOS PCT: 1 %
HCT: 41.8 % (ref 36.0–46.0)
Hemoglobin: 14.4 g/dL (ref 12.0–15.0)
Lymphocytes Relative: 29 %
Lymphs Abs: 4 10*3/uL (ref 0.7–4.0)
MCH: 31.6 pg (ref 26.0–34.0)
MCHC: 34.4 g/dL (ref 30.0–36.0)
MCV: 91.7 fL (ref 78.0–100.0)
Monocytes Absolute: 1.1 10*3/uL — ABNORMAL HIGH (ref 0.1–1.0)
Monocytes Relative: 8 %
Neutro Abs: 8.5 10*3/uL — ABNORMAL HIGH (ref 1.7–7.7)
Neutrophils Relative %: 61 %
PLATELETS: 269 10*3/uL (ref 150–400)
RBC: 4.56 MIL/uL (ref 3.87–5.11)
RDW: 13.6 % (ref 11.5–15.5)
WBC: 13.8 10*3/uL — ABNORMAL HIGH (ref 4.0–10.5)

## 2017-01-08 LAB — URINALYSIS, ROUTINE W REFLEX MICROSCOPIC
Bilirubin Urine: NEGATIVE
GLUCOSE, UA: NEGATIVE mg/dL
HGB URINE DIPSTICK: NEGATIVE
KETONES UR: NEGATIVE mg/dL
Nitrite: NEGATIVE
PH: 6.5 (ref 5.0–8.0)
PROTEIN: NEGATIVE mg/dL
Specific Gravity, Urine: 1.02 (ref 1.005–1.030)

## 2017-01-08 LAB — COMPREHENSIVE METABOLIC PANEL
ALBUMIN: 4 g/dL (ref 3.5–5.0)
ALT: 12 U/L — ABNORMAL LOW (ref 14–54)
ANION GAP: 9 (ref 5–15)
AST: 13 U/L — AB (ref 15–41)
Alkaline Phosphatase: 66 U/L (ref 38–126)
BUN: 12 mg/dL (ref 6–20)
CHLORIDE: 106 mmol/L (ref 101–111)
CO2: 23 mmol/L (ref 22–32)
Calcium: 8.8 mg/dL — ABNORMAL LOW (ref 8.9–10.3)
Creatinine, Ser: 0.68 mg/dL (ref 0.44–1.00)
GFR calc Af Amer: >60 mL/min (ref >60)
GFR calc non Af Amer: >60 mL/min (ref >60)
GLUCOSE: 112 mg/dL — AB (ref 65–99)
Potassium: 3.6 mmol/L (ref 3.5–5.1)
Sodium: 138 mmol/L (ref 135–145)
Total Bilirubin: 0.4 mg/dL (ref 0.3–1.2)
Total Protein: 7.6 g/dL (ref 6.5–8.1)

## 2017-01-08 LAB — URINALYSIS, MICROSCOPIC (REFLEX)

## 2017-01-08 LAB — OCCULT BLOOD X 1 CARD TO LAB, STOOL: Fecal Occult Bld: NEGATIVE

## 2017-01-08 LAB — LIPASE, BLOOD: LIPASE: 27 U/L (ref 11–51)

## 2017-01-08 MED ORDER — PHENAZOPYRIDINE HCL 100 MG PO TABS
95.0000 mg | ORAL_TABLET | Freq: Once | ORAL | Status: AC
  Administered 2017-01-08: 100 mg via ORAL
  Filled 2017-01-08: qty 1

## 2017-01-08 MED ORDER — PHENAZOPYRIDINE HCL 200 MG PO TABS: 200.0000 mg | ORAL_TABLET | Freq: Three times a day (TID) | ORAL | 0 refills | Status: AC

## 2017-01-08 MED ORDER — NITROFURANTOIN MONOHYD MACRO 100 MG PO CAPS: 100.0000 mg | ORAL_CAPSULE | Freq: Two times a day (BID) | ORAL | 0 refills | Status: AC

## 2017-01-08 MED ORDER — SODIUM CHLORIDE 0.9 % IV BOLUS (SEPSIS)
500.0000 mL | Freq: Once | INTRAVENOUS | Status: AC
  Administered 2017-01-08: 500 mL via INTRAVENOUS

## 2017-01-08 MED ORDER — ONDANSETRON HCL 4 MG/2ML IJ SOLN
4.0000 mg | Freq: Once | INTRAMUSCULAR | Status: AC
  Administered 2017-01-08: 4 mg via INTRAVENOUS
  Filled 2017-01-08: qty 2

## 2017-01-08 MED ORDER — ONDANSETRON 8 MG PO TBDP: ORAL_TABLET | ORAL | 0 refills | Status: DC

## 2017-01-08 MED ORDER — KETOROLAC TROMETHAMINE 30 MG/ML IJ SOLN
15.0000 mg | Freq: Once | INTRAMUSCULAR | Status: AC
  Administered 2017-01-08: 15 mg via INTRAVENOUS
  Filled 2017-01-08: qty 1

## 2017-01-08 MED ORDER — DEXTROSE 5 % IV SOLN
1.0000 g | Freq: Once | INTRAVENOUS | Status: AC
  Administered 2017-01-08: 1 g via INTRAVENOUS
  Filled 2017-01-08: qty 10

## 2017-01-08 NOTE — ED Triage Notes (Addendum)
PT presents with c/o lower abdominal pain lower back pain and rectal bleeding X 2  that started this am.

## 2017-01-08 NOTE — ED Notes (Signed)
PT ambulated to restroom without difficulty.  

## 2017-01-08 NOTE — ED Notes (Signed)
Pt discharged to home with family. NAD.  

## 2017-01-08 NOTE — ED Provider Notes (Signed)
[facility] EMERGENCY DEPARTMENT Provider Note   CSN: [id number] Arrival date & time: 01/08/17  0008     History   Chief Complaint Chief Complaint  Patient presents with  . Abdominal Pain  . Rectal Bleeding  . Back Pain    HPI Dana Morrow is a 52 y.o. female.  The history is provided by the patient.  Abdominal Pain   This is a new problem. The current episode started 12 to 24 hours ago. The problem occurs constantly. The problem has not changed since onset.The pain is associated with an unknown factor. The pain is located in the suprapubic region. The quality of the pain is burning. The pain is severe. Associated symptoms include nausea, dysuria and hematuria. Pertinent negatives include fever. Nothing aggravates the symptoms. Nothing relieves the symptoms. Past workup does not include barium enema. Her past medical history does not include ulcerative colitis.  blood on tissue wiping urine with her dysuria and low back pain.  On PO dilaudid at home x 26 years.    Past Medical History:  Diagnosis Date  . Abdominal pain, chronic, left lower quadrant    a. s/p Laproscopy in 2004 w/o finding of source of discomfort.  . Anxiety and depression 2013   Lots of stress  . Bilateral pulmonary embolism (HCC) 08/08/2016   with right popliteal vein DVT--hospitalized and d/c'd home on eliquis.  . Chest pain    a. reportedly nl MV and echo 10/2011 - [place].  CP at the time of PE dx--was supposed to get stress test as outpt but she rescheduled x 1, then no showed.  . Cholelithiasis    a. s/p cholecystectomy 10/2011  . Chronic pain disorder   . Colon polyps    a. s/p colonoscopy and polypectomy in 2003  . Diverticulosis    with ? of 'itis  . Family history of colon cancer    mother  . GERD (gastroesophageal reflux disease)   . Hiatal hernia    a. small by EGD 2006.  [name] History of amaurosis fugax 01/2011   L eye; saw neuro at [facility]  (Dr. Johnell Comings).  Lab w/u and carotid doppler nl per old records.  . Hyperlipidemia    LDL 191 in 2013 per old records  . Hypertension   . Migraine   . RSD (reflex sympathetic dystrophy)    a. L shoulder--pain mgmt with Dr. Marlynn Perking at [facility] [place]  . Tobacco abuse    a. 50 pack year hx.  . Visual disturbance    a. 12/2011 Left eye visual changes->w/u for TIA->Carotid U/S:  No ICA stenosis; Head CT: No acute abnl; MRI/MRA: mild RPCA stenosis; Echo: EF 55-60%, Triv AI, No PFO.    Patient Active Problem List   Diagnosis Date Noted  . Pleuritic chest pain 10/23/2016  . Dehydration 10/23/2016  . Leukocytosis 10/23/2016  . Tobacco abuse 10/23/2016  . Bilateral pulmonary embolism (HCC) 08/08/2016  . Acute deep vein thrombosis (DVT) of right popliteal vein (HCC) 08/08/2016  . Chronic pain syndrome 11/24/2015  . Essential hypertension 11/24/2015  . Complex regional pain syndrome I of upper limb 01/16/2013    Past Surgical History:  Procedure Laterality Date  . CESAREAN SECTION  1990  . COLONOSCOPY  10/2001   Dr. Ewing Schlein; diverticulosis, int/ext hem, one small polyp removed.  . ENDOMETRIAL ABLATION  2011  . ESOPHAGOGASTRODUODENOSCOPY  02/2004   Dr. Ewing Schlein; normal  . LAPAROSCOPIC CHOLECYSTECTOMY  10/19/11  . LAPAROSCOPY  05/2002   Dr. Harmon Dunimothy Davis--no cause for LLQ abd pain found  . SHOULDER SURGERY Left 1997 &98  . TRANSTHORACIC ECHOCARDIOGRAM  08/09/2016   Normal except grd I DD.  [name]. UMBILICAL HERNIA REPAIR  2014   with mesh    OB History    No data available       Home Medications    Prior to Admission medications   Medication Sig Start Date End Date Taking? Authorizing Provider  cloNIDine (CATAPRES) 0.3 MG tablet TAKE ONE TABLET BY MOUTH 3 TIMES DAILY 11/07/16   McGowen, Maryjean MornPhilip H, MD  HYDROmorphone (DILAUDID) 2 MG tablet Take 2 mg by mouth 4 (four) times daily as needed for moderate pain.     [provider]  oxyCODONE-acetaminophen (ROXICET)  5-325 MG tablet Take 1 tablet by mouth every 6 (six) hours as needed. Patient taking differently: Take 1 tablet by mouth every 6 (six) hours as needed for moderate pain.  08/09/16   Amin, Loura HaltAnkit Chirag, MD  rivaroxaban (XARELTO) 20 MG TABS tablet Take 1 tablet (20 mg total) by mouth daily with supper. 10/24/16   Tyrone NineGrunz, Ryan B, MD    Family History Family History  Problem Relation Age of Onset  . Dementia Father   . Parkinson's disease Father        d. 2013  . Hypertension Father   . Hyperlipidemia Father   . Heart disease Father   . Heart attack Mother        died @ 7867  . Colon cancer Mother   . Hyperlipidemia Mother   . Heart disease Mother   . Hypertension Mother   . Esophageal cancer Brother   . Diabetes Neg Hx     Social History Social History   Tobacco Use  . Smoking status: Current Every Day Smoker    Packs/day: 0.50    Years: 30.00    Pack years: 15.00    Types: Cigarettes  . Smokeless tobacco: Never Used  Substance Use Topics  . Alcohol use: No  . Drug use: No     Allergies   Flagyl [metronidazole]; Elavil [amitriptyline]; Klonopin [clonazepam]; and Levaquin [levofloxacin hemihydrate]   Review of Systems Review of Systems  Constitutional: Negative for fever.  Gastrointestinal: Positive for abdominal pain and nausea. Negative for anal bleeding.  Genitourinary: Positive for dysuria and hematuria.  All other systems reviewed and are negative.    Physical Exam Updated Vital Signs BP (!) 167/106   Pulse 99   Temp 98.2 F (36.8 C) (Oral)   Resp (!) 21   SpO2 100%   Physical Exam  Constitutional: She is oriented to person, place, and time. She appears well-developed and well-nourished.  HENT:  Head: Normocephalic and atraumatic.  Mouth/Throat: No oropharyngeal exudate.  Eyes: Conjunctivae are normal. Pupils are equal, round, and reactive to light.  Neck: Normal range of motion. Neck supple.  Cardiovascular: Normal rate, regular rhythm, normal heart  sounds and intact distal pulses.  Pulmonary/Chest: Effort normal and breath sounds normal. No stridor. She has no wheezes. She has no rales.  Abdominal: Soft. Bowel sounds are normal. She exhibits no mass. There is no tenderness. There is no rebound and no guarding.  Genitourinary: Rectal exam shows guaiac negative stool.  Musculoskeletal: Normal range of motion.  Neurological: She is alert and oriented to person, place, and time. She displays normal reflexes.  Skin: Skin is warm and dry. Capillary refill takes less than 2 seconds.  Psychiatric: She has a normal mood and  affect.  Nursing note and vitals reviewed.    ED Treatments / Results  Labs (all labs ordered are listed, but only abnormal results are displayed) Labs Reviewed  URINALYSIS, ROUTINE W REFLEX MICROSCOPIC - Abnormal; Notable for the following components:      Result Value   APPearance HAZY (*)    Leukocytes, UA TRACE (*)    All other components within normal limits  URINALYSIS, MICROSCOPIC (REFLEX) - Abnormal; Notable for the following components:   Bacteria, UA MANY (*)    Squamous Epithelial / LPF 0-5 (*)    All other components within normal limits  CBC WITH DIFFERENTIAL/PLATELET - Abnormal; Notable for the following components:   WBC 13.8 (*)    Neutro Abs 8.5 (*)    Monocytes Absolute 1.1 (*)    All other components within normal limits  COMPREHENSIVE METABOLIC PANEL - Abnormal; Notable for the following components:   Glucose, Bld 112 (*)    Calcium 8.8 (*)    AST 13 (*)    ALT 12 (*)    All other components within normal limits  LIPASE, BLOOD  OCCULT BLOOD X 1 CARD TO LAB, STOOL  POC OCCULT BLOOD, ED    EKG  EKG Interpretation None       Radiology No results found.  Procedures Procedures (including critical care time)  Medications Ordered in ED Medications  cefTRIAXone (ROCEPHIN) 1 g in dextrose 5 % 50 mL IVPB (1 g Intravenous New Bag/Given 01/08/17 0304)  sodium chloride 0.9 % bolus 500 mL  (500 mLs Intravenous New Bag/Given 01/08/17 0304)  ketorolac (TORADOL) 30 MG/ML injection 15 mg (15 mg Intravenous Given 01/08/17 0304)  ondansetron (ZOFRAN) injection 4 mg (4 mg Intravenous Given 01/08/17 0304)  phenazopyridine (PYRIDIUM) tablet 100 mg (100 mg Oral Given 01/08/17 0304)       Final Clinical Impressions(s) / ED Diagnoses   Cystitis with hematuria: will treat for same. Will add pyridium.  No GIB by history or on exam.    Return for worsening pain, weakness numbness, inability to walk, inability to pass urine, fevers > 100.4 unrelieved by medication,  intractable vomiting, or diarrhea, abdominal pain, Inability to tolerate liquids or food, cough, altered mental status or any concerns. No signs of systemic illness or infection. The patient is nontoxic-appearing on exam and vital signs are within normal limits.    I have reviewed the triage vital signs and the nursing notes. Pertinent labs &imaging results that were available during my care of the patient were reviewed by me and considered in my medical decision making (see chart for details).  After history, exam, and medical workup I feel the patient has been appropriately medically screened and is safe for discharge home. Pertinent diagnoses were discussed with the patient. Patient was given return precautions     Kasim Mccorkle, MD 01/08/17 [phone]

## 2017-01-29 ENCOUNTER — Encounter: Payer: Self-pay | Admitting: *Deleted

## 2017-01-29 DIAGNOSIS — G9050 Complex regional pain syndrome I, unspecified: Secondary | ICD-10-CM | POA: Insufficient documentation

## 2017-01-29 DIAGNOSIS — K8020 Calculus of gallbladder without cholecystitis without obstruction: Secondary | ICD-10-CM | POA: Insufficient documentation

## 2017-01-29 DIAGNOSIS — K5790 Diverticulosis of intestine, part unspecified, without perforation or abscess without bleeding: Secondary | ICD-10-CM | POA: Insufficient documentation

## 2017-01-29 DIAGNOSIS — Z8711 Personal history of peptic ulcer disease: Secondary | ICD-10-CM | POA: Insufficient documentation

## 2017-01-29 DIAGNOSIS — R519 Headache, unspecified: Secondary | ICD-10-CM | POA: Insufficient documentation

## 2017-01-29 DIAGNOSIS — R51 Headache: Secondary | ICD-10-CM

## 2017-01-29 DIAGNOSIS — E785 Hyperlipidemia, unspecified: Secondary | ICD-10-CM | POA: Insufficient documentation

## 2017-01-29 DIAGNOSIS — G90519 Complex regional pain syndrome I of unspecified upper limb: Secondary | ICD-10-CM | POA: Insufficient documentation

## 2017-01-30 ENCOUNTER — Encounter: Payer: Medicare Other | Admitting: Family Medicine

## 2017-01-30 DIAGNOSIS — Z0289 Encounter for other administrative examinations: Secondary | ICD-10-CM

## 2017-01-30 NOTE — Progress Notes (Deleted)
Office Note 01/30/2017  CC: No chief complaint on file.   HPI:  Dana Morrow is a 52 y.o. White female who is here for annual health maintenance exam.   Past Medical History:  Diagnosis Date  . Abdominal pain, chronic, left lower quadrant    a. s/p Laproscopy in 2004 w/o finding of source of discomfort.  . Anxiety and depression 2013   Lots of stress  . Bilateral pulmonary embolism (HCC) 08/08/2016   with right popliteal vein DVT--hospitalized and d/c'd home on eliquis.  . Chest pain    a. reportedly nl MV and echo 10/2011 - [place].  CP at the time of PE dx--was supposed to get stress test as outpt but she rescheduled x 1, then no showed.  . Cholelithiasis    a. s/p cholecystectomy 10/2011  . Chronic pain disorder   . Colon polyps    a. s/p colonoscopy and polypectomy in 2003  . Diverticulosis    with ? of 'itis  . Family history of colon cancer    mother  . GERD (gastroesophageal reflux disease)   . Hiatal hernia    a. small by EGD 2006.  [name]. History of amaurosis fugax 01/2011   L eye; saw neuro at [facility] (Dr. Johnell ComingsMieden).  Lab w/u and carotid doppler nl per old records.  . Hyperlipidemia    LDL 191 in 2013 per old records  . Hypertension   . Migraine   . RSD (reflex sympathetic dystrophy)    a. L shoulder--pain mgmt with Dr. Marlynn PerkingMalloy at [facility] [place]  . Tobacco abuse    a. 50 pack year hx.  . Visual disturbance    a. 12/2011 Left eye visual changes->w/u for TIA->Carotid U/S:  No ICA stenosis; Head CT: No acute abnl; MRI/MRA: mild RPCA stenosis; Echo: EF 55-60%, Triv AI, No PFO.    Past Surgical History:  Procedure Laterality Date  . CESAREAN SECTION  1990  . COLONOSCOPY  10/2001   Dr. Ewing SchleinMagod; diverticulosis, int/ext hem, one small polyp removed.  . ENDOMETRIAL ABLATION  2011  . ESOPHAGOGASTRODUODENOSCOPY  02/2004   Dr. Ewing SchleinMagod; normal  . LAPAROSCOPIC CHOLECYSTECTOMY  10/19/11  . LAPAROSCOPY  05/2002   Dr.  Harmon Dunimothy Davis--no cause for LLQ abd pain found  . SHOULDER SURGERY Left 1997 &98  . TRANSTHORACIC ECHOCARDIOGRAM  08/09/2016   Normal except grd I DD.  [name]. UMBILICAL HERNIA REPAIR  2014   with mesh    Family History  Problem Relation Age of Onset  . Dementia Father   . Parkinson's disease Father        d. 2013  . Hypertension Father   . Hyperlipidemia Father   . Heart disease Father   . Heart attack Mother        died @ 8367  . Colon cancer Mother   . Hyperlipidemia Mother   . Heart disease Mother   . Hypertension Mother   . Esophageal cancer Brother   . Diabetes Neg Hx     Social History   Socioeconomic History  . Marital status: Divorced    Spouse name: Not on file  . Number of children: Not on file  . Years of education: Not on file  . Highest education level: Not on file  Social Needs  . Financial resource strain: Not on file  . Food insecurity - worry: Not on file  . Food insecurity - inability: Not on file  . Transportation needs - medical: Not on  file  . Transportation needs - non-medical: Not on file  Occupational History  . Not on file  Tobacco Use  . Smoking status: Current Every Day Smoker    Packs/day: 0.50    Years: 30.00    Pack years: 15.00    Types: Cigarettes  . Smokeless tobacco: Never Used  Substance and Sexual Activity  . Alcohol use: No  . Drug use: No  . Sexual activity: Not on file  Other Topics Concern  . Not on file  Social History Narrative   Divorced, 1 son, 1 granddaughter.   Relocated to [place] from [place] in 04/2014 to live with her boyfriend.     Had been walking 3 miles on a treadmill most days of the week until late October when she had cholecystectomy.   Tob 30 pack-yr hx.   Alc: none   No hx of substance abuse.    Outpatient Medications Prior to Visit  Medication Sig Dispense Refill  . cloNIDine (CATAPRES) 0.3 MG tablet TAKE ONE TABLET BY MOUTH 3 TIMES DAILY 90 tablet 1  . HYDROmorphone (DILAUDID) 2 MG tablet  Take 2 mg by mouth 4 (four) times daily as needed for moderate pain.     . nitrofurantoin, macrocrystal-monohydrate, (MACROBID) 100 MG capsule Take 1 capsule (100 mg total) by mouth 2 (two) times daily. X 7 days 14 capsule 0  . ondansetron (ZOFRAN ODT) 8 MG disintegrating tablet 8mg  ODT q8 hours prn nausea 4 tablet 0  . oxyCODONE-acetaminophen (ROXICET) 5-325 MG tablet Take 1 tablet by mouth every 6 (six) hours as needed. (Patient taking differently: Take 1 tablet by mouth every 6 (six) hours as needed for moderate pain. ) 30 tablet 0  . phenazopyridine (PYRIDIUM) 200 MG tablet Take 1 tablet (200 mg total) by mouth 3 (three) times daily. 6 tablet 0  . rivaroxaban (XARELTO) 20 MG TABS tablet Take 1 tablet (20 mg total) by mouth daily with supper. 30 tablet 0   No facility-administered medications prior to visit.     Allergies  Allergen Reactions  . Flagyl [Metronidazole] Nausea And Vomiting    Oral form only, IV pt can tolerate  . Elavil [Amitriptyline]     hallucinations  . Klonopin [Clonazepam]     Hallucinations   . Levaquin [Levofloxacin Hemihydrate] Hives    ROS *** PE; There were no vitals taken for this visit. *** Pertinent labs:  No results found for: TSH Lab Results  Component Value Date   WBC 13.8 (H) 01/08/2017   HGB 14.4 01/08/2017   HCT 41.8 01/08/2017   MCV 91.7 01/08/2017   PLT 269 01/08/2017   Lab Results  Component Value Date   CREATININE 0.68 01/08/2017   BUN 12 01/08/2017   NA 138 01/08/2017   K 3.6 01/08/2017   CL 106 01/08/2017   CO2 23 01/08/2017   Lab Results  Component Value Date   ALT 12 (L) 01/08/2017   AST 13 (L) 01/08/2017   ALKPHOS 66 01/08/2017   BILITOT 0.4 01/08/2017   Lab Results  Component Value Date   CHOL 228 (H) 12/18/2011   Lab Results  Component Value Date   HDL 40 12/18/2011   Lab Results  Component Value Date   LDLCALC 150 (H) 12/18/2011   Lab Results  Component Value Date   TRIG 190 (H) 12/18/2011   Lab  Results  Component Value Date   CHOLHDL 5.7 12/18/2011    ASSESSMENT AND PLAN:   Health maintenance exam: Reviewed age and  gender appropriate health maintenance issues (prudent diet, regular exercise, health risks of tobacco and excessive alcohol, use of seatbelts, fire alarms in home, use of sunscreen).  Also reviewed age and gender appropriate health screening as well as vaccine recommendations. Vaccines: Tdap UTD.  Flu vaccine--    Shingrix discussed-- Labs: fasting : recent CBC and CMET good.  Check FLP and TSH today. Cervical ca screening: Breast ca screening: Colon ca screening: colonoscopy w/out polyps 2003.  Has FH of CC in mother.  Needs repeat colonoscopy--   An After Visit Summary was printed and given to the patient.  FOLLOW UP:  No Follow-up on file.  Signed:  Santiago Bumpers, MD           01/30/2017

## 2017-03-27 ENCOUNTER — Other Ambulatory Visit: Payer: Self-pay | Admitting: *Deleted

## 2017-03-27 MED ORDER — CLONIDINE HCL 0.3 MG PO TABS: ORAL_TABLET | ORAL | 1 refills | Status: DC

## 2017-03-27 NOTE — Telephone Encounter (Signed)
Dismiss patient please. I will send in 30d supply of clonidine with 1 additional RF, but no further rx's for this med after this.

## 2017-03-27 NOTE — Telephone Encounter (Signed)
Received refill request from [facility]. This pharmacy was not listed in pts chart.   RF request for clonidine LOV: 08/01/16 Next ov: None Last written: 01/08/17 #90 w/ 1RF  Pt was sent a no show letter on 08/27/16. Pt no showed on 10/10/16, 11/01/16 and 01/30/17. How would you like to proceed?   Please advise. Thanks.

## 2017-04-22 ENCOUNTER — Other Ambulatory Visit: Payer: Self-pay | Admitting: Family Medicine

## 2017-04-23 ENCOUNTER — Emergency Department (HOSPITAL_COMMUNITY): Payer: Medicare HMO

## 2017-04-23 ENCOUNTER — Encounter (HOSPITAL_COMMUNITY): Payer: Self-pay | Admitting: Emergency Medicine

## 2017-04-23 ENCOUNTER — Emergency Department (HOSPITAL_COMMUNITY)
Admission: EM | Admit: 2017-04-23 | Discharge: 2017-04-23 | Disposition: A | Discharge status: Home / Self Care | Payer: Medicare HMO | Attending: Emergency Medicine | Admitting: Emergency Medicine

## 2017-04-23 ENCOUNTER — Encounter: Payer: Self-pay | Admitting: Family Medicine

## 2017-04-23 ENCOUNTER — Other Ambulatory Visit: Payer: Self-pay

## 2017-04-23 DIAGNOSIS — Z79899 Other long term (current) drug therapy: Secondary | ICD-10-CM | POA: Insufficient documentation

## 2017-04-23 DIAGNOSIS — I10 Essential (primary) hypertension: Secondary | ICD-10-CM | POA: Insufficient documentation

## 2017-04-23 DIAGNOSIS — Z86718 Personal history of other venous thrombosis and embolism: Secondary | ICD-10-CM | POA: Insufficient documentation

## 2017-04-23 DIAGNOSIS — F17210 Nicotine dependence, cigarettes, uncomplicated: Secondary | ICD-10-CM | POA: Insufficient documentation

## 2017-04-23 DIAGNOSIS — R079 Chest pain, unspecified: Secondary | ICD-10-CM | POA: Diagnosis present

## 2017-04-23 DIAGNOSIS — M79661 Pain in right lower leg: Secondary | ICD-10-CM | POA: Diagnosis not present

## 2017-04-23 LAB — BASIC METABOLIC PANEL
Anion gap: 9 (ref 5–15)
BUN: 9 mg/dL (ref 6–20)
CALCIUM: 8.8 mg/dL — AB (ref 8.9–10.3)
CO2: 24 mmol/L (ref 22–32)
CREATININE: 0.82 mg/dL (ref 0.44–1.00)
Chloride: 102 mmol/L (ref 101–111)
GFR calc non Af Amer: >60 mL/min (ref >60)
Glucose, Bld: 118 mg/dL — ABNORMAL HIGH (ref 65–99)
Potassium: 3.7 mmol/L (ref 3.5–5.1)
Sodium: 135 mmol/L (ref 135–145)

## 2017-04-23 LAB — CBC
HCT: 42.7 % (ref 36.0–46.0)
Hemoglobin: 14.9 g/dL (ref 12.0–15.0)
MCH: 32.3 pg (ref 26.0–34.0)
MCHC: 34.9 g/dL (ref 30.0–36.0)
MCV: 92.4 fL (ref 78.0–100.0)
PLATELETS: 244 10*3/uL (ref 150–400)
RBC: 4.62 MIL/uL (ref 3.87–5.11)
RDW: 13.2 % (ref 11.5–15.5)
WBC: 16.1 10*3/uL — ABNORMAL HIGH (ref 4.0–10.5)

## 2017-04-23 LAB — I-STAT BETA HCG BLOOD, ED (MC, WL, AP ONLY): I-stat hCG, quantitative: 12.8 m[IU]/mL — ABNORMAL HIGH (ref <5)

## 2017-04-23 LAB — I-STAT TROPONIN, ED
TROPONIN I, POC: 0 ng/mL (ref 0.00–0.08)
Troponin i, poc: 0.01 ng/mL (ref 0.00–0.08)

## 2017-04-23 MED ORDER — HYDROMORPHONE HCL 2 MG/ML IJ SOLN
1.0000 mg | Freq: Once | INTRAMUSCULAR | Status: AC
  Administered 2017-04-23: 1 mg via INTRAVENOUS
  Filled 2017-04-23: qty 1

## 2017-04-23 MED ORDER — ONDANSETRON HCL 4 MG/2ML IJ SOLN
4.0000 mg | Freq: Once | INTRAMUSCULAR | Status: AC
  Administered 2017-04-23: 4 mg via INTRAVENOUS
  Filled 2017-04-23: qty 2

## 2017-04-23 MED ORDER — IOPAMIDOL (ISOVUE-370) INJECTION 76%
INTRAVENOUS | Status: AC
  Filled 2017-04-23: qty 100

## 2017-04-23 MED ORDER — IOPAMIDOL (ISOVUE-370) INJECTION 76%
100.0000 mL | Freq: Once | INTRAVENOUS | Status: AC | PRN
  Administered 2017-04-23: 100 mL via INTRAVENOUS

## 2017-04-23 MED ORDER — CLONIDINE HCL 0.1 MG PO TABS
0.3000 mg | ORAL_TABLET | Freq: Once | ORAL | Status: AC
  Administered 2017-04-23: 0.3 mg via ORAL
  Filled 2017-04-23: qty 1

## 2017-04-23 NOTE — ED Notes (Signed)
Called Patient for vitals and had no answer. 

## 2017-04-23 NOTE — Telephone Encounter (Signed)
See refill note dated 03/27/17. Looks like pt was not d/c as requested.   Pt was sent a no show letter on 08/27/16. Pt no showed on 10/10/16, 11/01/16 and 01/30/17.  Please advise. Thanks.

## 2017-04-23 NOTE — Telephone Encounter (Signed)
SW [place], she will send d/c letter today, will send Rx for 30 day supply.

## 2017-04-23 NOTE — Discharge Instructions (Addendum)
Return in the morning for a DVT study. This may affect Xarelto dosing which might impact your treatment later this week for RSD. Return to the ED for any worsening symptoms or new concerns.

## 2017-04-23 NOTE — ED Provider Notes (Signed)
[facility] EMERGENCY DEPARTMENT Provider Note   CSN: [id number] Arrival date & time: 04/23/17  1724     History   Chief Complaint Chief Complaint  Patient presents with  . Chest Pain    HPI Dana Morrow is a 52 y.o. female.  Patient presents for evaluation of right calf pain and swelling for 3 days. No injury. She started having chest pain today described as sharp. She denies significant SOB, cough or fever. She has mild nasal congestion. 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 She is currently back on Xarelto but scheduled for another treatment this week that would require her to come off.    The history is provided by the patient. No language interpreter was used.  Chest Pain   Pertinent negatives include no abdominal pain, no fever, no nausea, no numbness, no shortness of breath and no vomiting.    Past Medical History:  Diagnosis Date  . Abdominal pain, chronic, left lower quadrant    a. s/p Laproscopy in 2004 w/o finding of source of discomfort.  . Anxiety and depression 2013   Lots of stress  . Bilateral pulmonary embolism (HCC) 08/08/2016   with right popliteal vein DVT--hospitalized and d/c'd home on eliquis.  . Chest pain    a. reportedly nl MV and echo 10/2011 - [place].  CP at the time of PE dx--was supposed to get stress test as outpt but she rescheduled x 1, then no showed.  . Cholelithiasis    a. s/p cholecystectomy 10/2011  . Chronic pain disorder   . Colon polyps    a. s/p colonoscopy and polypectomy in 2003  . Diverticulosis    with ? of 'itis  . Family history of colon cancer    mother  . GERD (gastroesophageal reflux disease)   . Hiatal hernia    a. small by EGD 2006.  [name] History of amaurosis fugax 01/2011   L eye; saw neuro at [facility] (Dr. Johnell Comings).  Lab  w/u and carotid doppler nl per old records.  . Hyperlipidemia    LDL 191 in 2013 per old records  . Hypertension   . Migraine   . RSD (reflex sympathetic dystrophy)    a. L shoulder--pain mgmt with Dr. Marlynn Perking at [facility] [place]  . Tobacco abuse    a. 50 pack year hx.  . Visual disturbance    a. 12/2011 Left eye visual changes->w/u for TIA->Carotid U/S:  No ICA stenosis; Head CT: No acute abnl; MRI/MRA: mild RPCA stenosis; Echo: EF 55-60%, Triv AI, No PFO.    Patient Active Problem List   Diagnosis Date Noted  . Asymptomatic cholelithiasis 01/29/2017  . Diverticulosis 01/29/2017  . History of peptic ulcer 01/29/2017  . Hyperlipidemia 01/29/2017  . Headache 01/29/2017  . RSD (reflex sympathetic dystrophy) 01/29/2017  . RSD upper limb 01/29/2017  . Chronic narcotic use 12/25/2016  . Pleuritic chest pain 10/23/2016  . Dehydration 10/23/2016  . Leukocytosis 10/23/2016  . Tobacco abuse 10/23/2016  . 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 Bilateral pulmonary embolism (HCC) 08/08/2016  . Acute deep vein thrombosis (DVT) of right popliteal vein (HCC) 08/08/2016  . Chronic pain syndrome 11/24/2015  . Essential hypertension 11/24/2015  . Complex regional pain syndrome I of upper limb 01/16/2013  . Lumbosacral radiculopathy 11/19/2012  Past Surgical History:  Procedure Laterality Date  . CESAREAN SECTION  1990  . COLONOSCOPY  10/2001   Dr. Ewing SchleinMagod; diverticulosis, int/ext hem, one small polyp removed.  . ENDOMETRIAL ABLATION  2011  . ESOPHAGOGASTRODUODENOSCOPY  02/2004   Dr. Ewing SchleinMagod; normal  . LAPAROSCOPIC CHOLECYSTECTOMY  10/19/11  . LAPAROSCOPY  05/2002   Dr. Harmon Dunimothy Davis--no cause for LLQ abd pain found  . SHOULDER SURGERY Left 1997 &98  . TRANSTHORACIC ECHOCARDIOGRAM  08/09/2016   Normal except grd I DD.  [name]. UMBILICAL HERNIA REPAIR  2014   with mesh     OB History   None      Home Medications    Prior to Admission medications   Medication Sig  Start Date End Date Taking? Authorizing Provider  cloNIDine (CATAPRES) 0.3 MG tablet TAKE ONE TABLET BY MOUTH 3 TIMES DAILY 04/23/17   McGowen, Maryjean MornPhilip H, MD  HYDROmorphone (DILAUDID) 2 MG tablet Take 2 mg by mouth 4 (four) times daily as needed for moderate pain.     [provider]  nitrofurantoin, macrocrystal-monohydrate, (MACROBID) 100 MG capsule Take 1 capsule (100 mg total) by mouth 2 (two) times daily. X 7 days 01/08/17   Nicanor AlconPalumbo, April, MD  ondansetron [facility](ZOFRAN ODT) 8 MG disintegrating tablet 8mg  ODT q8 hours prn nausea 01/08/17   Palumbo, April, MD  oxyCODONE-acetaminophen (ROXICET) 5-325 MG tablet Take 1 tablet by mouth every 6 (six) hours as needed. Patient taking differently: Take 1 tablet by mouth every 6 (six) hours as needed for moderate pain.  08/09/16   Amin, Loura HaltAnkit Chirag, MD  phenazopyridine (PYRIDIUM) 200 MG tablet Take 1 tablet (200 mg total) by mouth 3 (three) times daily. 01/08/17   Palumbo, April, MD  rivaroxaban (XARELTO) 20 MG TABS tablet Take 1 tablet (20 mg total) by mouth daily with supper. 10/24/16   Tyrone NineGrunz, Ryan B, MD    Family History Family History  Problem Relation Age of Onset  . Dementia Father   . Parkinson's disease Father        d. 2013  . Hypertension Father   . Hyperlipidemia Father   . Heart disease Father   . Heart attack Mother        died @ 4967  . Colon cancer Mother   . Hyperlipidemia Mother   . Heart disease Mother   . Hypertension Mother   . Esophageal cancer Brother   . Diabetes Neg Hx     Social History Social History   Tobacco Use  . Smoking status: Current Every Day Smoker    Packs/day: 0.50    Years: 30.00    Pack years: 15.00    Types: Cigarettes  . Smokeless tobacco: Never Used  Substance Use Topics  . Alcohol use: No  . Drug use: No     Allergies   Flagyl [metronidazole]; Elavil [amitriptyline]; Klonopin [clonazepam]; and Levaquin [levofloxacin hemihydrate]   Review of Systems Review of Systems  Constitutional:  Negative for chills and fever.  HENT: Positive for congestion.   Respiratory: Negative.  Negative for shortness of breath.   Cardiovascular: Positive for chest pain.  Gastrointestinal: Negative.  Negative for abdominal pain, nausea and vomiting.  Musculoskeletal:       See HPI.  Skin: Negative.  Negative for color change.  Neurological: Negative.  Negative for syncope, light-headedness and numbness.     Physical Exam Updated Vital Signs BP (!) 150/94 (BP Location: Right Arm)   Pulse 90   Temp 97.6 F (36.4 C) (Oral)  Resp 20   Ht 5\' 3"  (1.6 m)   Wt 82.6 kg (182 lb)   SpO2 100%   BMI 32.24 kg/m   Physical Exam  Constitutional: She appears well-developed and well-nourished.  HENT:  Head: Normocephalic.  Neck: Normal range of motion. Neck supple.  Cardiovascular: Normal rate, regular rhythm and normal pulses.  No carotid bruit  Pulmonary/Chest: Effort normal and breath sounds normal.  Abdominal: Soft. Bowel sounds are normal. There is no tenderness. There is no rebound and no guarding.  Musculoskeletal: Normal range of motion.  Right calf tenderness at mid-shaft, posterior aspect. Mild swelling.   Neurological: She is alert. No cranial nerve deficit.  Skin: Skin is warm and dry. No rash noted.  Psychiatric: She has a normal mood and affect.     ED Treatments / Results  Labs (all labs ordered are listed, but only abnormal results are displayed) Labs Reviewed  BASIC METABOLIC PANEL - Abnormal; Notable for the following components:      Result Value   Glucose, Bld 118 (*)    Calcium 8.8 (*)    All other components within normal limits  CBC - Abnormal; Notable for the following components:   WBC 16.1 (*)    All other components within normal limits  I-STAT BETA HCG BLOOD, ED ([facility], [facility], [facility] ONLY) - Abnormal; Notable for the following components:   I-stat hCG, quantitative 12.8 (*)    All other components within normal limits  I-STAT TROPONIN, ED  I-STAT TROPONIN, ED    Results for orders placed or performed during the hospital encounter of 04/23/17  Basic metabolic panel  Result Value Ref Range   Sodium 135 135 - 145 mmol/L   Potassium 3.7 3.5 - 5.1 mmol/L   Chloride 102 101 - 111 mmol/L   CO2 24 22 - 32 mmol/L   Glucose, Bld 118 (H) 65 - 99 mg/dL   BUN 9 6 - 20 mg/dL   Creatinine, Ser 1.61 0.44 - 1.00 mg/dL   Calcium 8.8 (L) 8.9 - 10.3 mg/dL   GFR calc non Af Amer >60 >60 mL/min   GFR calc Af Amer >60 >60 mL/min   Anion gap 9 5 - 15  CBC  Result Value Ref Range   WBC 16.1 (H) 4.0 - 10.5 K/uL   RBC 4.62 3.87 - 5.11 MIL/uL   Hemoglobin 14.9 12.0 - 15.0 g/dL   HCT 09.6 04.5 - 40.9 %   MCV 92.4 78.0 - 100.0 fL   MCH 32.3 26.0 - 34.0 pg   MCHC 34.9 30.0 - 36.0 g/dL   RDW 81.1 91.4 - 78.2 %   Platelets 244 150 - 400 K/uL  I-stat troponin, ED  Result Value Ref Range   Troponin i, poc 0.00 0.00 - 0.08 ng/mL   Comment 3          I-Stat beta hCG blood, ED  Result Value Ref Range   I-stat hCG, quantitative 12.8 (H) <5 mIU/mL   Comment 3          I-Stat Troponin, ED (not at [facility])  Result Value Ref Range   Troponin i, poc 0.01 0.00 - 0.08 ng/mL   Comment 3            EKG EKG Interpretation  Date/Time:  Wednesday April 23 2017 17:31:21 EDT Ventricular Rate:  112 PR Interval:  140 QRS Duration: 74 QT Interval:  344 QTC Calculation: 469 R Axis:   2 Text Interpretation:  Sinus tachycardia Cannot rule  out Anterior infarct , age undetermined Abnormal ECG rate faster compared to Oct 2018 Confirmed by Pricilla Loveless [id number] on 04/23/2017 10:24:58 PM   Radiology Dg Chest 2 View  Result Date: 04/23/2017 CLINICAL DATA:  Vomiting and chest pain, smoker EXAM: CHEST - 2 VIEW COMPARISON:  03/03/2017 FINDINGS: The heart size and mediastinal contours are within normal limits. Both lungs are clear. The visualized skeletal structures are unremarkable. IMPRESSION: No active cardiopulmonary disease. Electronically Signed   By: Tollie Eth M.D.   On:  04/23/2017 18:43   Ct Angio Chest Pe W And/or Wo Contrast  Result Date: 04/23/2017 CLINICAL DATA:  Concern for pulmonary embolism. RIGHT-sided leg pain for 2 days. History of DVT. LEFT-sided chest pain which started this morning EXAM: CT ANGIOGRAPHY CHEST WITH CONTRAST TECHNIQUE: Multidetector CT imaging of the chest was performed using the standard protocol during bolus administration of intravenous contrast. Multiplanar CT image reconstructions and MIPs were obtained to evaluate the vascular anatomy. CONTRAST:  ISOVUE-370 IOPAMIDOL (ISOVUE-370) INJECTION 76% COMPARISON:  Chest CT 10/23/2016 FINDINGS: Cardiovascular: No filling defects within the pulmonary arteries to suggest acute pulmonary embolism. No acute findings of the aorta or great vessels. No pericardial fluid. Mediastinum/Nodes: No axillary supraclavicular adenopathy. No mediastinal hilar adenopathy. No pericardial effusion. Lungs/Pleura: No pulmonary infarction. No pulmonary infection. Airways are normal. Upper Abdomen: Limited view of the liver, kidneys, pancreas are unremarkable. Normal adrenal glands. Musculoskeletal: No aggressive osseous lesion. Review of the MIP images confirms the above findings. IMPRESSION: No acute pulmonary embolism. Electronically Signed   By: Genevive Bi M.D.   On: 04/23/2017 20:54    Procedures Procedures (including critical care time)  Medications Ordered in ED Medications  iopamidol (ISOVUE-370) 76 % injection (has no administration in time range)  iopamidol (ISOVUE-370) 76 % injection 100 mL (100 mLs Intravenous Contrast Given 04/23/17 2020)     Initial Impression / Assessment and Plan / ED Course  I have reviewed the triage vital signs and the nursing notes.  Pertinent labs & imaging results that were available during my care of the patient were reviewed by me and considered in my medical decision making (see chart for details).     Patient presents with calf pain x 3 days, today with  chest pain, history of DVT/PE, medically supervised Xarelto dosing that is intermittent secondary to treatment of RSD, currently on Xarelto. No fever.  Labs today significant for leukocytosis of 16K. CTA negative for PE. Doppler study not obtained.   VSS. Initially tachycardic but this has resolved. Troponin negative x 2. EKG nonacute. Discussed with Dr. Criss Alvine who feels work up is complete and patient is stable for discharge.   DVT study planned for morning as she is due for another treatment for RSD later this week.   She can be discharged home. All questions answered.   Final Clinical Impressions(s) / ED Diagnoses   Final diagnoses:  None   1. Right lower extremity pain 2. History of DVT/PE  ED Discharge Orders    None       Elpidio Anis, Cordelia Poche 04/23/17 2311    Pricilla Loveless, MD 04/23/17 2340

## 2017-04-23 NOTE — ED Triage Notes (Signed)
Pt c/o right knee pain that started on Monday, developed chest pain today. Hx DVT, prescribed xarelto but has been out x 2 weeks.

## 2017-04-24 ENCOUNTER — Ambulatory Visit (HOSPITAL_COMMUNITY): Payer: Medicare HMO | Attending: Emergency Medicine

## 2017-04-24 ENCOUNTER — Telehealth: Payer: Self-pay | Admitting: Family Medicine

## 2017-04-24 NOTE — Telephone Encounter (Signed)
Patient dismissed from [facility] by Earley FavorPhillip McGowen MD , effective April 23, 2017. Dismissal letter sent out by certified / registered mail.  daj

## 2017-04-27 ENCOUNTER — Emergency Department (HOSPITAL_BASED_OUTPATIENT_CLINIC_OR_DEPARTMENT_OTHER)
Admission: EM | Admit: 2017-04-27 | Discharge: 2017-04-27 | Disposition: A | Discharge status: Home / Self Care | Payer: Medicare HMO | Attending: Emergency Medicine | Admitting: Emergency Medicine

## 2017-04-27 ENCOUNTER — Other Ambulatory Visit: Payer: Self-pay

## 2017-04-27 ENCOUNTER — Encounter (HOSPITAL_BASED_OUTPATIENT_CLINIC_OR_DEPARTMENT_OTHER): Payer: Self-pay | Admitting: *Deleted

## 2017-04-27 ENCOUNTER — Emergency Department (HOSPITAL_BASED_OUTPATIENT_CLINIC_OR_DEPARTMENT_OTHER): Payer: Medicare HMO

## 2017-04-27 DIAGNOSIS — J069 Acute upper respiratory infection, unspecified: Secondary | ICD-10-CM

## 2017-04-27 DIAGNOSIS — F17210 Nicotine dependence, cigarettes, uncomplicated: Secondary | ICD-10-CM | POA: Diagnosis not present

## 2017-04-27 DIAGNOSIS — Z79899 Other long term (current) drug therapy: Secondary | ICD-10-CM | POA: Diagnosis not present

## 2017-04-27 DIAGNOSIS — J40 Bronchitis, not specified as acute or chronic: Secondary | ICD-10-CM

## 2017-04-27 DIAGNOSIS — B9789 Other viral agents as the cause of diseases classified elsewhere: Secondary | ICD-10-CM | POA: Diagnosis not present

## 2017-04-27 DIAGNOSIS — R079 Chest pain, unspecified: Secondary | ICD-10-CM | POA: Diagnosis present

## 2017-04-27 DIAGNOSIS — J209 Acute bronchitis, unspecified: Secondary | ICD-10-CM | POA: Diagnosis not present

## 2017-04-27 DIAGNOSIS — I10 Essential (primary) hypertension: Secondary | ICD-10-CM | POA: Diagnosis not present

## 2017-04-27 MED ORDER — ONDANSETRON 4 MG PO TBDP
4.0000 mg | ORAL_TABLET | Freq: Once | ORAL | Status: AC
  Administered 2017-04-27: 4 mg via ORAL
  Filled 2017-04-27: qty 1

## 2017-04-27 MED ORDER — BENZONATATE 100 MG PO CAPS: 200.0000 mg | ORAL_CAPSULE | Freq: Two times a day (BID) | ORAL | 0 refills | Status: AC | PRN

## 2017-04-27 MED ORDER — ALBUTEROL SULFATE HFA 108 (90 BASE) MCG/ACT IN AERS
2.0000 | INHALATION_SPRAY | Freq: Once | RESPIRATORY_TRACT | Status: AC
  Administered 2017-04-27: 2 via RESPIRATORY_TRACT
  Filled 2017-04-27: qty 6.7

## 2017-04-27 MED ORDER — AEROCHAMBER PLUS FLO-VU MEDIUM MISC
1.0000 | Freq: Once | Status: AC
  Administered 2017-04-27: 1
  Filled 2017-04-27: qty 1

## 2017-04-27 MED ORDER — DEXAMETHASONE 6 MG PO TABS
10.0000 mg | ORAL_TABLET | Freq: Once | ORAL | Status: AC
  Administered 2017-04-27: 10 mg via ORAL
  Filled 2017-04-27: qty 1

## 2017-04-27 MED ORDER — ONDANSETRON 4 MG PO TBDP: 4.0000 mg | ORAL_TABLET | Freq: Three times a day (TID) | ORAL | 0 refills | Status: DC | PRN

## 2017-04-27 NOTE — ED Triage Notes (Signed)
Pt states she was seen by her PCP last week and sent to [facility] for further testing to r/o DVT or PE. CT and labs were "ok". Developed cough x 3 days. Non-productive. "Sharp" epigastric pain. Decreased appetite. Fever since Thursday as well. Took Tylenol ES x 2 at 5:30.

## 2017-04-27 NOTE — ED Notes (Signed)
Pt triaged by this RN 

## 2017-04-27 NOTE — ED Provider Notes (Signed)
[facility] EMERGENCY DEPARTMENT Provider Note   CSN: [id number] Arrival date & time: 04/27/17  4259     History   Chief Complaint Chief Complaint  Patient presents with  . Chest Pain    HPI CIA Dana Morrow is a 52 y.o. female.  52yo F w/ PMH including PE on Xarelto, HTN, HLD who p/w cough. Last week she saw her PCP for chest pain and was sent to [facility], where she had a negative work up for PE including negative CT scan. Afterwards, 3 days ago she began having a non-productive cough associated with nasal congestion. She reports she has continued to have the same chest pain/epigastric pain, it's just been more intense since the coughing began. She reports associated SOB and fatigue. She's had decreased appetite, 1 episode of vomiting yesterday, and fevers. T max 102.7 2 days ago. She has been taking extra strength tylenol, last dose 5:30am today. No known sick contacts. No diarrhea.  The history is provided by the patient.  Chest Pain      Past Medical History:  Diagnosis Date  . Abdominal pain, chronic, left lower quadrant    a. s/p Laproscopy in 2004 w/o finding of source of discomfort.  . Anxiety and depression 2013   Lots of stress  . Bilateral pulmonary embolism (HCC) 08/08/2016   with right popliteal vein DVT--hospitalized and d/c'd home on eliquis.  . Chest pain    a. reportedly nl MV and echo 10/2011 - [place].  CP at the time of PE dx--was supposed to get stress test as outpt but she rescheduled x 1, then no showed.  . Cholelithiasis    a. s/p cholecystectomy 10/2011  . Chronic pain disorder   . Colon polyps    a. s/p colonoscopy and polypectomy in 2003  . Diverticulosis    with ? of 'itis  . Family history of colon cancer    mother  . GERD (gastroesophageal reflux disease)   . Hiatal hernia    a. small by EGD 2006.  [name] History of amaurosis fugax 01/2011   L eye; saw neuro at [facility] (Dr. Johnell Comings).  Lab w/u and  carotid doppler nl per old records.  . Hyperlipidemia    LDL 191 in 2013 per old records  . Hypertension   . Migraine   . RSD (reflex sympathetic dystrophy)    a. L shoulder--pain mgmt with Dr. Marlynn Perking at [facility] [place]  . Tobacco abuse    a. 50 pack year hx.  . Visual disturbance    a. 12/2011 Left eye visual changes->w/u for TIA->Carotid U/S:  No ICA stenosis; Head CT: No acute abnl; MRI/MRA: mild RPCA stenosis; Echo: EF 55-60%, Triv AI, No PFO.    Patient Active Problem List   Diagnosis Date Noted  . Asymptomatic cholelithiasis 01/29/2017  . Diverticulosis 01/29/2017  . History of peptic ulcer 01/29/2017  . Hyperlipidemia 01/29/2017  . Headache 01/29/2017  . RSD (reflex sympathetic dystrophy) 01/29/2017  . RSD upper limb 01/29/2017  . Chronic narcotic use 12/25/2016  . Pleuritic chest pain 10/23/2016  . Dehydration 10/23/2016  . Leukocytosis 10/23/2016  . Tobacco abuse 10/23/2016  . 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 Bilateral pulmonary embolism (HCC) 08/08/2016  . Acute deep vein thrombosis (DVT) of right popliteal vein (HCC) 08/08/2016  . Chronic pain syndrome 11/24/2015  . Essential hypertension 11/24/2015  . Complex regional pain syndrome I of upper limb 01/16/2013  . Lumbosacral radiculopathy 11/19/2012  Past Surgical History:  Procedure Laterality Date  . CESAREAN SECTION  1990  . COLONOSCOPY  10/2001   Dr. Ewing SchleinMagod; diverticulosis, int/ext hem, one small polyp removed.  . ENDOMETRIAL ABLATION  2011  . ESOPHAGOGASTRODUODENOSCOPY  02/2004   Dr. Ewing SchleinMagod; normal  . LAPAROSCOPIC CHOLECYSTECTOMY  10/19/11  . LAPAROSCOPY  05/2002   Dr. Harmon Dunimothy Davis--no cause for LLQ abd pain found  . SHOULDER SURGERY Left 1997 &98  . TRANSTHORACIC ECHOCARDIOGRAM  08/09/2016   Normal except grd I DD.  [name]. UMBILICAL HERNIA REPAIR  2014   with mesh     OB History   None      Home Medications    Prior to Admission medications   Medication Sig Start  Date End Date Taking? Authorizing Provider  acetaminophen (TYLENOL) 325 MG tablet Take 650 mg by mouth every 6 (six) hours as needed for mild pain.    [provider]  benzonatate (TESSALON) 100 MG capsule Take 2 capsules (200 mg total) by mouth 2 (two) times daily as needed for up to 4 days for cough. 04/27/17 05/01/17  Danaja Lasota, Ambrose Finlandachel Morgan, MD  cloNIDine (CATAPRES) 0.3 MG tablet TAKE ONE TABLET BY MOUTH 3 TIMES DAILY 04/23/17   McGowen, Maryjean MornPhilip H, MD  HYDROcodone-acetaminophen (NORCO/VICODIN) 5-325 MG tablet Take 1 tablet by mouth every 6 (six) hours as needed for moderate pain.  12/29/12   [provider]  nitrofurantoin, macrocrystal-monohydrate, (MACROBID) 100 MG capsule Take 1 capsule (100 mg total) by mouth 2 (two) times daily. X 7 days Patient not taking: Reported on 04/23/2017 01/08/17   Palumbo, April, MD  ondansetron (ZOFRAN ODT) 4 MG disintegrating tablet Take 1 tablet (4 mg total) by mouth every 8 (eight) hours as needed for nausea or vomiting. 04/27/17   Tucker Steedley, Ambrose Finlandachel Morgan, MD  oxyCODONE-acetaminophen (ROXICET) 5-325 MG tablet Take 1 tablet by mouth every 6 (six) hours as needed. Patient not taking: Reported on 04/23/2017 08/09/16   Dimple NanasAmin, Ankit Chirag, MD  phenazopyridine (PYRIDIUM) 200 MG tablet Take 1 tablet (200 mg total) by mouth 3 (three) times daily. Patient not taking: Reported on 04/23/2017 01/08/17   Nicanor AlconPalumbo, April, MD  rivaroxaban (XARELTO) 20 MG TABS tablet Take 1 tablet (20 mg total) by mouth daily with supper. 10/24/16   Tyrone NineGrunz, Ryan B, MD    Family History Family History  Problem Relation Age of Onset  . Dementia Father   . Parkinson's disease Father        d. 2013  . Hypertension Father   . Hyperlipidemia Father   . Heart disease Father   . Heart attack Mother        died @ 3567  . Colon cancer Mother   . Hyperlipidemia Mother   . Heart disease Mother   . Hypertension Mother   . Esophageal cancer Brother   . Diabetes Neg Hx     Social  History Social History   Tobacco Use  . Smoking status: Current Every Day Smoker    Packs/day: 0.50    Years: 30.00    Pack years: 15.00    Types: Cigarettes  . Smokeless tobacco: Never Used  Substance Use Topics  . Alcohol use: No  . Drug use: No     Allergies   Flagyl [metronidazole]; Elavil [amitriptyline]; Klonopin [clonazepam]; and Levaquin [levofloxacin hemihydrate]   Review of Systems Review of Systems  Cardiovascular: Positive for chest pain.   All other systems reviewed and are negative except that which was mentioned in HPI  Physical Exam Updated Vital Signs BP (!) 149/89   Pulse 82   Temp 98.6 F (37 C)   Resp (!) 23   SpO2 100%   Physical Exam  Constitutional: She is oriented to person, place, and time. She appears well-developed and well-nourished. No distress.  HENT:  Head: Normocephalic and atraumatic.  Moist mucous membranes  Eyes: Conjunctivae are normal.  Neck: Neck supple.  Cardiovascular: Normal rate, regular rhythm and normal heart sounds.  No murmur heard. Pulmonary/Chest: Effort normal. No accessory muscle usage. No respiratory distress.  Mildly diminished in bases b/l  Abdominal: Soft. Bowel sounds are normal. She exhibits no distension. There is no tenderness.  Musculoskeletal: She exhibits no edema.  Lymphadenopathy:    She has no cervical adenopathy.  Neurological: She is alert and oriented to person, place, and time.  Fluent speech  Skin: Skin is warm and dry.  Psychiatric: She has a normal mood and affect. Judgment normal.  Nursing note and vitals reviewed.    ED Treatments / Results  Labs (all labs ordered are listed, but only abnormal results are displayed) Labs Reviewed - No data to display  EKG EKG Interpretation  Date/Time:  Sunday April 27 2017 06:53:12 EDT Ventricular Rate:  97 PR Interval:    QRS Duration: 81 QT Interval:  360 QTC Calculation: 458 R Axis:   55 Text Interpretation:  Sinus rhythm Probable  left atrial enlargement Probable anterior infarct, old Baseline wander in lead(s) V1 No significant change since last tracing Confirmed by Frederick Peers [phone]) on 04/27/2017 7:33:41 AM   Radiology Dg Chest 2 View  Result Date: 04/27/2017 CLINICAL DATA:  Cough and chest congestion for the past 3 days. Shortness of breath. EXAM: CHEST - 2 VIEW COMPARISON:  04/23/2017. FINDINGS: Normal sized heart. Clear lungs with normal vascularity. Mild central peribronchial thickening. Mild thoracic spine degenerative changes. Cholecystectomy clips. IMPRESSION: Mild bronchitic changes. Electronically Signed   By: Beckie Salts M.D.   On: 04/27/2017 08:22    Procedures Procedures (including critical care time)  Medications Ordered in ED Medications  dexamethasone (DECADRON) tablet 10 mg (has no administration in time range)  ondansetron (ZOFRAN-ODT) disintegrating tablet 4 mg (4 mg Oral Given 04/27/17 0744)  albuterol (PROVENTIL HFA;VENTOLIN HFA) 108 (90 Base) MCG/ACT inhaler 2 puff (2 puffs Inhalation Given 04/27/17 0836)  AEROCHAMBER PLUS FLO-VU MEDIUM MISC 1 each (1 each Other Given 04/27/17 6045)     Initial Impression / Assessment and Plan / ED Course  I have reviewed the triage vital signs and the nursing notes.  Pertinent imaging results that were available during my care of the patient were reviewed by me and considered in my medical decision making (see chart for details).     PT non-toxic on exam, normal O2 sat, afebrile, no respiratory distress.  Chest x-ray negative acute.  Gave albuterol because of her frequent cough and significant smoking history.  On repeat exam, she endorsed improvement in her breathing and she had occasional end expiratory wheezes.  Because of this, I gave a dose of Decadron and recommended continuing albuterol at home.  Nausea improved after Zofran.  Discussed supportive measures.  She will follow-up with PCP in 2 to 3 days for reassessment.  She understands return  precautions.  Final Clinical Impressions(s) / ED Diagnoses   Final diagnoses:  Viral URI with cough  Bronchitis    ED Discharge Orders        Ordered    benzonatate (TESSALON) 100 MG capsule  2 times  daily PRN     04/27/17 0855    ondansetron (ZOFRAN ODT) 4 MG disintegrating tablet  Every 8 hours PRN     04/27/17 0857       Sloane Palmer, Ambrose Finland, MD 04/27/17 [phone]

## 2017-05-13 NOTE — Telephone Encounter (Signed)
Noted. thx 

## 2017-05-13 NOTE — Telephone Encounter (Signed)
Certified dismissal letter returned as undeliverable, unclaimed, return to sender after three attempts by [employer] on May 13, 2017. Letter placed in another envelope and resent as 1st class mail which does not require a signature. daj

## 2017-05-20 ENCOUNTER — Other Ambulatory Visit: Payer: Self-pay | Admitting: Family Medicine

## 2017-07-04 IMAGING — CT CT ABD-PELV W/ CM
2 of 5 series · 17 of 46 positions shown, 19 images · IV contrast (APPLIED)
Comparison: 09/06/2014

CLINICAL DATA: Nausea and vomiting

EXAM:
CT ABDOMEN AND PELVIS WITH CONTRAST
TECHNIQUE: Multidetector CT imaging of the abdomen and pelvis was performed
using the standard protocol following bolus administration of
intravenous contrast.
CONTRAST:  100mL OMNIPAQUE IOHEXOL 300 MG/ML SOLN, 25mL OMNIPAQUE
IOHEXOL 300 MG/ML SOLN

[Series 2: axial st · axial · 0.96mm/px · z∈[-485,-65]mm · 14 of 94 slices shown, 16 images]
[im 5/94  soft-tissue]
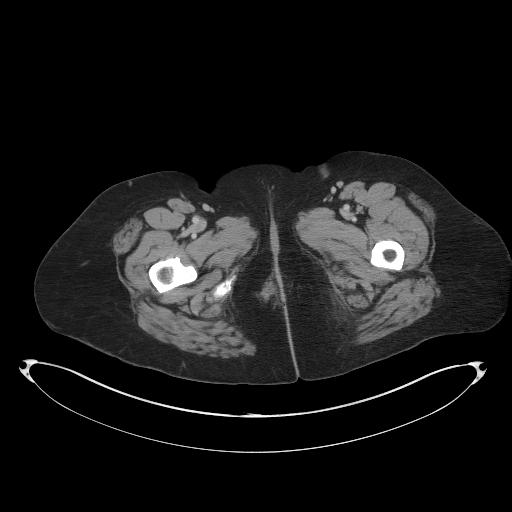
[im 5/94  bone]
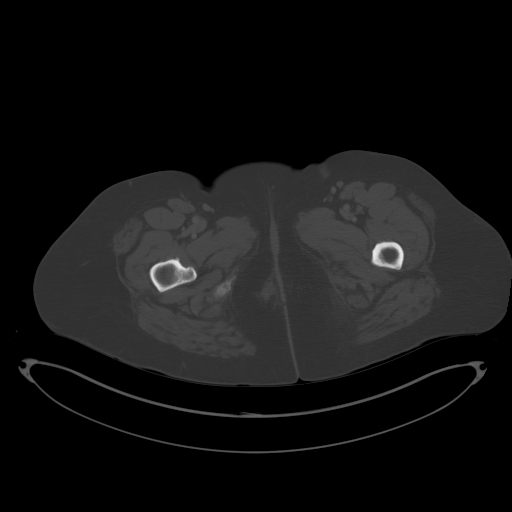
[im 14/94  soft-tissue]
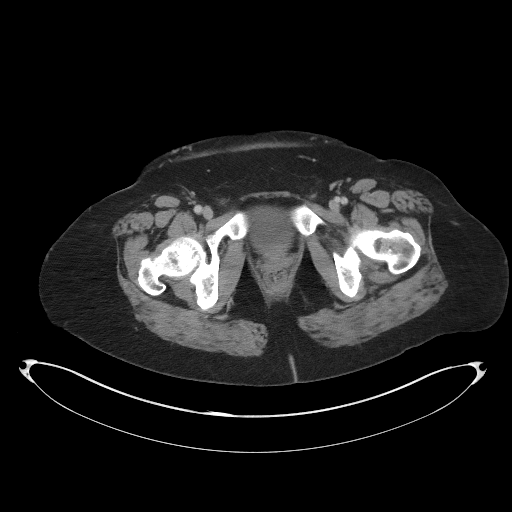
[im 19/94  soft-tissue]
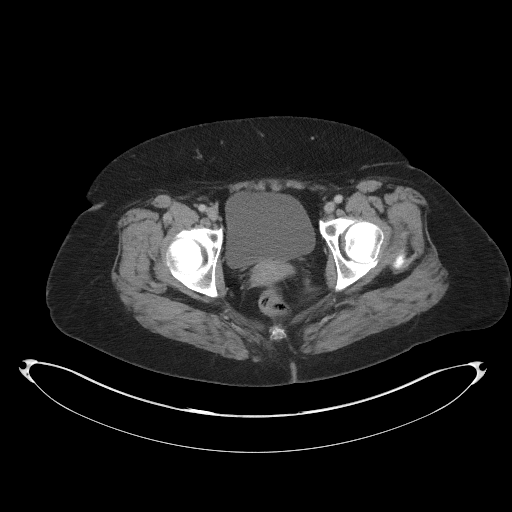
[im 24/94  soft-tissue]
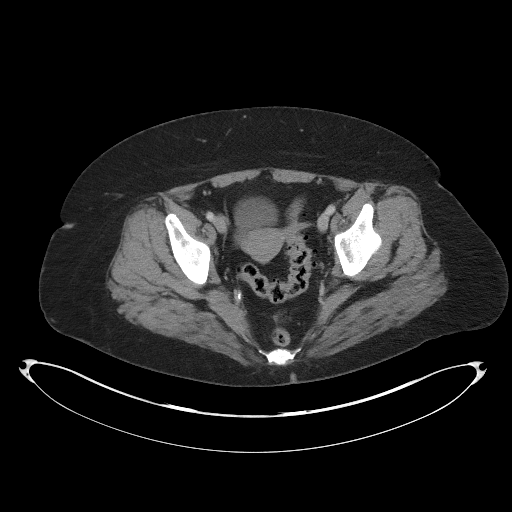
[im 33/94  soft-tissue]
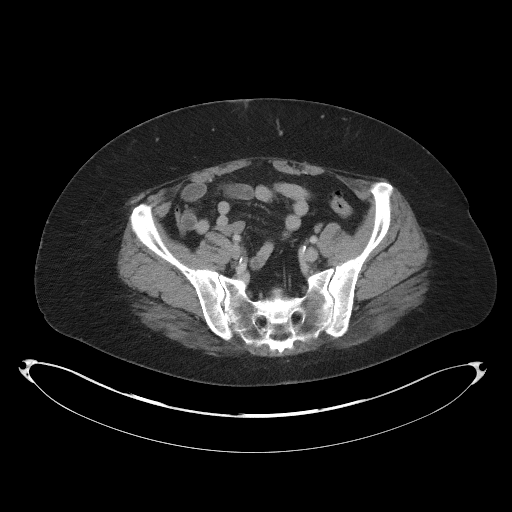
[im 38/94  soft-tissue]
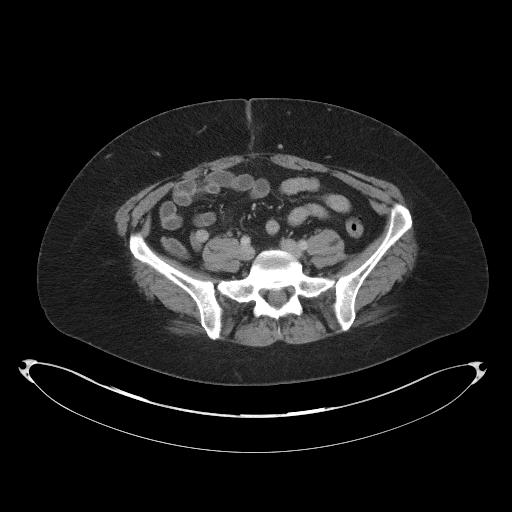
[im 42/94  soft-tissue]
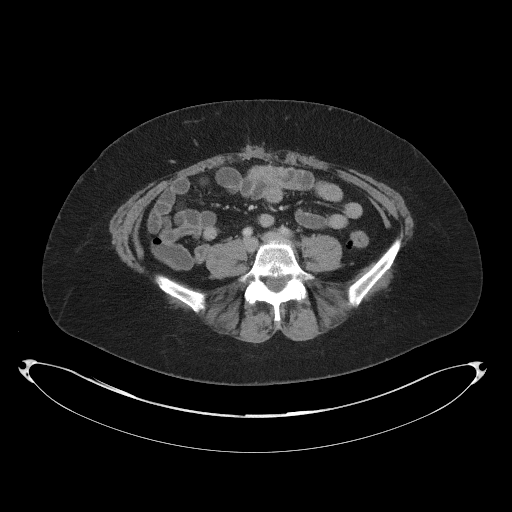
[im 52/94  soft-tissue]
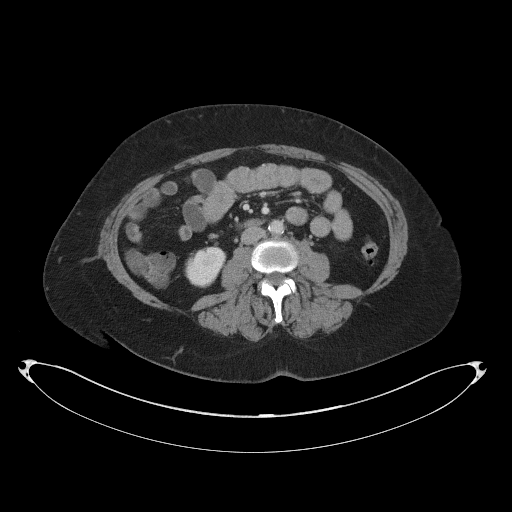
[im 56/94  soft-tissue]
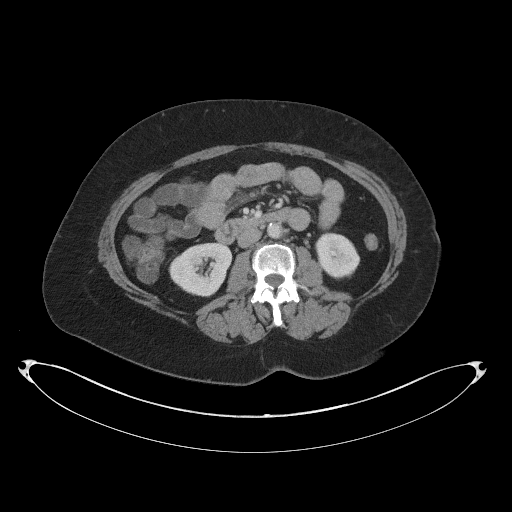
[im 56/94  bone]
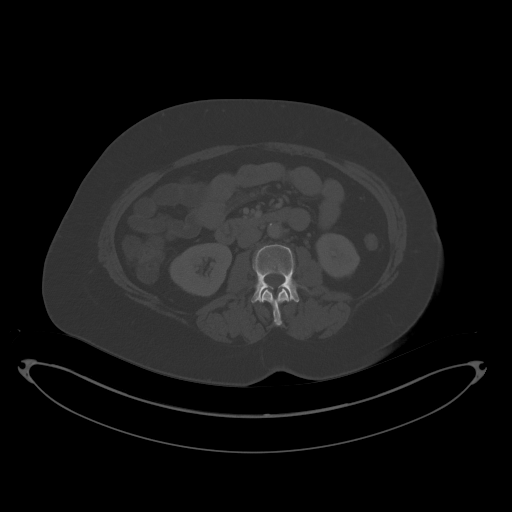
[im 61/94  soft-tissue]
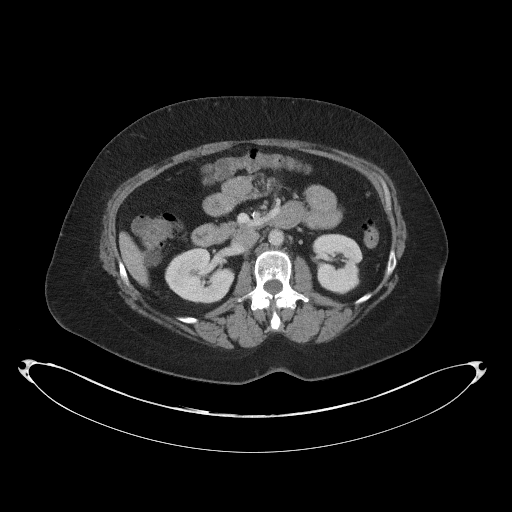
[im 70/94  soft-tissue]
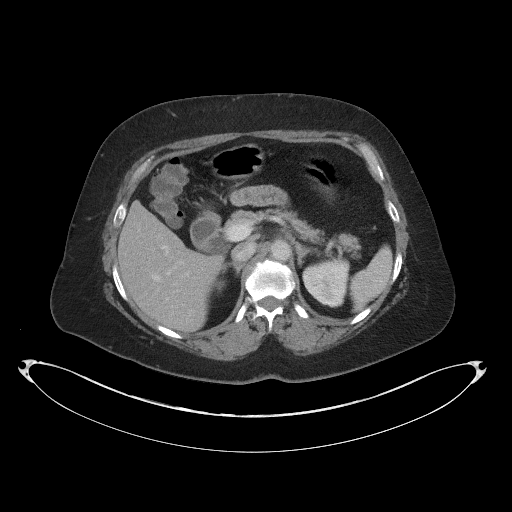
[im 75/94  soft-tissue]
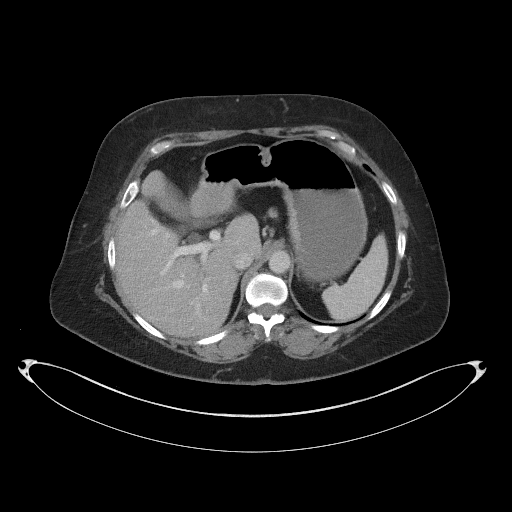
[im 80/94  soft-tissue]
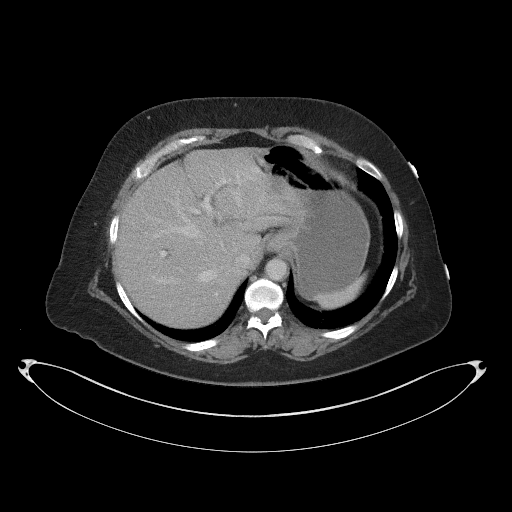
[im 89/94  soft-tissue]
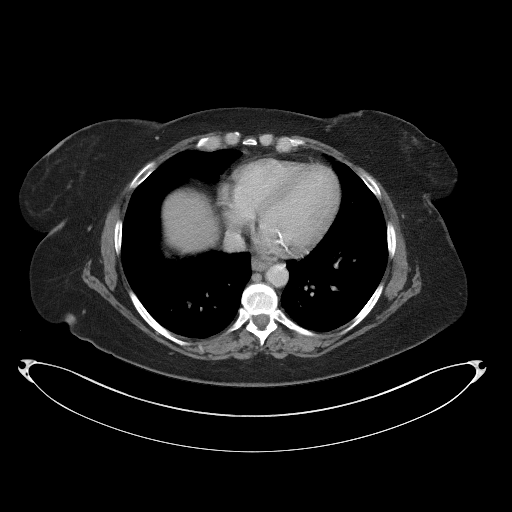

[Series 5: coronal st · coronal · 0.95mm/px · 3 of 91 slices shown]
[im 31/91  soft-tissue]
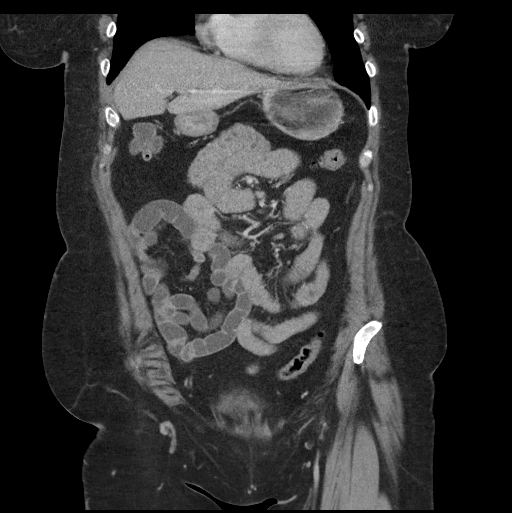
[im 41/91  soft-tissue]
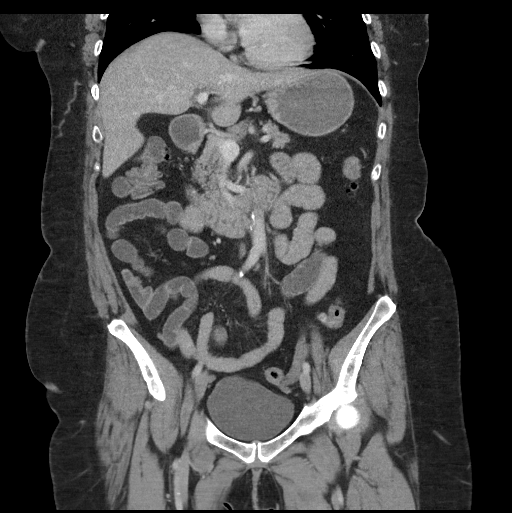
[im 51/91  soft-tissue]
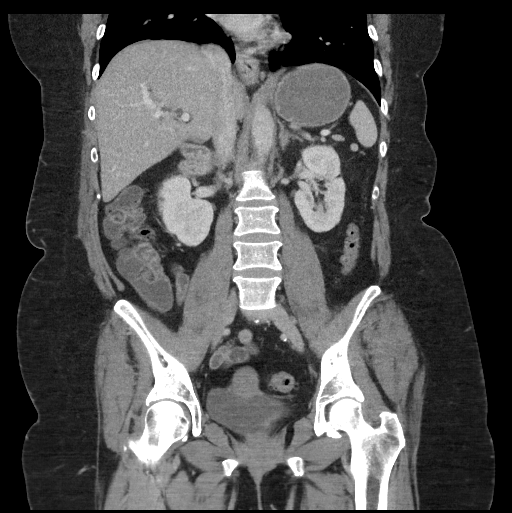

[17 of 46 positions shown; findings below may reference images not displayed]

FINDINGS: The lung bases are free of acute infiltrate or sizable effusion.
Minimal scarring is noted in the medial left lung base. Gallbladder
is been surgically removed. The liver, spleen, adrenal glands and
pancreas are within normal limits. Kidneys are well visualized
bilaterally. No renal calculi or obstructive changes are noted.
Normal excretion of contrast is seen.

Diverticular change of the colon is noted without evidence of
diverticulitis. The appendix is within normal limits. The bladder is
well distended. No pelvic mass lesion is seen. The uterus is
unremarkable. No obstructive changes are seen within the bowel. The
osseous structures show chronic changes.
IMPRESSION: Chronic changes without acute abnormality.

## 2017-07-30 ENCOUNTER — Emergency Department (HOSPITAL_BASED_OUTPATIENT_CLINIC_OR_DEPARTMENT_OTHER): Payer: Medicare HMO

## 2017-07-30 ENCOUNTER — Encounter (HOSPITAL_BASED_OUTPATIENT_CLINIC_OR_DEPARTMENT_OTHER): Payer: Self-pay | Admitting: Emergency Medicine

## 2017-07-30 ENCOUNTER — Other Ambulatory Visit: Payer: Self-pay

## 2017-07-30 ENCOUNTER — Emergency Department (HOSPITAL_BASED_OUTPATIENT_CLINIC_OR_DEPARTMENT_OTHER)
Admission: EM | Admit: 2017-07-30 | Discharge: 2017-07-30 | Disposition: A | Discharge status: Home / Self Care | Payer: Medicare HMO | Attending: Emergency Medicine | Admitting: Emergency Medicine

## 2017-07-30 DIAGNOSIS — R1032 Left lower quadrant pain: Secondary | ICD-10-CM | POA: Diagnosis present

## 2017-07-30 DIAGNOSIS — F17210 Nicotine dependence, cigarettes, uncomplicated: Secondary | ICD-10-CM | POA: Diagnosis not present

## 2017-07-30 DIAGNOSIS — I10 Essential (primary) hypertension: Secondary | ICD-10-CM | POA: Diagnosis not present

## 2017-07-30 DIAGNOSIS — R072 Precordial pain: Secondary | ICD-10-CM | POA: Insufficient documentation

## 2017-07-30 LAB — COMPREHENSIVE METABOLIC PANEL
ALBUMIN: 3.7 g/dL (ref 3.5–5.0)
ALT: 17 U/L (ref 0–44)
AST: 20 U/L (ref 15–41)
Alkaline Phosphatase: 68 U/L (ref 38–126)
Anion gap: 8 (ref 5–15)
BUN: 14 mg/dL (ref 6–20)
CO2: 28 mmol/L (ref 22–32)
Calcium: 8.9 mg/dL (ref 8.9–10.3)
Chloride: 103 mmol/L (ref 98–111)
Creatinine, Ser: 0.73 mg/dL (ref 0.44–1.00)
GFR calc Af Amer: >60 mL/min (ref >60)
GFR calc non Af Amer: >60 mL/min (ref >60)
GLUCOSE: 93 mg/dL (ref 70–99)
POTASSIUM: 4 mmol/L (ref 3.5–5.1)
SODIUM: 139 mmol/L (ref 135–145)
Total Bilirubin: 0.3 mg/dL (ref 0.3–1.2)
Total Protein: 7.4 g/dL (ref 6.5–8.1)

## 2017-07-30 LAB — CBC WITH DIFFERENTIAL/PLATELET
BASOS ABS: 0.1 10*3/uL (ref 0.0–0.1)
Basophils Relative: 1 %
EOS ABS: 0.5 10*3/uL (ref 0.0–0.7)
Eosinophils Relative: 4 %
HCT: 42.4 % (ref 36.0–46.0)
Hemoglobin: 14.6 g/dL (ref 12.0–15.0)
Lymphocytes Relative: 34 %
Lymphs Abs: 4.7 10*3/uL — ABNORMAL HIGH (ref 0.7–4.0)
MCH: 32.7 pg (ref 26.0–34.0)
MCHC: 34.4 g/dL (ref 30.0–36.0)
MCV: 95.1 fL (ref 78.0–100.0)
Monocytes Absolute: 1.1 10*3/uL — ABNORMAL HIGH (ref 0.1–1.0)
Monocytes Relative: 8 %
NEUTROS PCT: 53 %
Neutro Abs: 7.3 10*3/uL (ref 1.7–7.7)
PLATELETS: ADEQUATE 10*3/uL (ref 150–400)
RBC: 4.46 MIL/uL (ref 3.87–5.11)
RDW: 14.2 % (ref 11.5–15.5)
SMEAR REVIEW: ADEQUATE
WBC: 13.7 10*3/uL — ABNORMAL HIGH (ref 4.0–10.5)

## 2017-07-30 LAB — LIPASE, BLOOD: LIPASE: 25 U/L (ref 11–51)

## 2017-07-30 LAB — TROPONIN I
TROPONIN I: 0.03 ng/mL — AB (ref <0.03)
Troponin I: 0.03 ng/mL (ref <0.03)

## 2017-07-30 MED ORDER — ONDANSETRON HCL 4 MG/2ML IJ SOLN
4.0000 mg | Freq: Once | INTRAMUSCULAR | Status: AC
  Administered 2017-07-30: 4 mg via INTRAVENOUS
  Filled 2017-07-30: qty 2

## 2017-07-30 MED ORDER — IOPAMIDOL (ISOVUE-300) INJECTION 61%
100.0000 mL | Freq: Once | INTRAVENOUS | Status: AC | PRN
  Administered 2017-07-30: 100 mL via INTRAVENOUS

## 2017-07-30 MED ORDER — MORPHINE SULFATE (PF) 4 MG/ML IV SOLN
4.0000 mg | Freq: Once | INTRAVENOUS | Status: AC
  Administered 2017-07-30: 4 mg via INTRAVENOUS
  Filled 2017-07-30: qty 1

## 2017-07-30 MED ORDER — OXYCODONE-ACETAMINOPHEN 5-325 MG PO TABS
2.0000 | ORAL_TABLET | Freq: Once | ORAL | Status: AC
  Administered 2017-07-30: 2 via ORAL
  Filled 2017-07-30: qty 2

## 2017-07-30 NOTE — ED Notes (Signed)
Date and time results received: 07/30/17 2028 (use smartphrase ".now" to insert current time)  Test: troponin Critical Value: 0.03  Name of Provider Notified: Dr. Jacqulyn BathLong  Orders Received? Or Actions Taken?: no new orders

## 2017-07-30 NOTE — ED Notes (Signed)
ED Provider at bedside. 

## 2017-07-30 NOTE — ED Triage Notes (Signed)
Reports history of diverticulitis with diarrhea x 3 days.  Reports she has LLQ abdominal pain.  Additionally endorses epigastric pain which began after lunch.  Patient with history reflux.  Denies shortness of breath, vomiting.  Reports nausea-states she took 4mg  of zofran at 230 this afternoon as well as 10mg  percocet.

## 2017-07-30 NOTE — ED Provider Notes (Signed)
Emergency Department Provider Note   I have reviewed the triage vital signs and the nursing notes.   HISTORY  Chief Complaint Chest Pain and Abdominal Pain   HPI Dana Morrow is a 52 y.o. female with PMH of PE, GERD, HTN, and HLD presents to the emergency department for evaluation of left lower quadrant abdominal pain with diarrhea and fatigue.  Symptoms have been worsening over the past 3 days.  She denies any blood in the bowel movements.  Patient states that today she developed some central chest pressure which radiates slightly to the left.  She is also developed a cough.  She has had similar chest pressure in the past with pneumonia and the chest pain along with the abdominal discomfort prompted her emergency department visit today.  She denies any vomiting but has had nausea and continued diarrhea.  She is on chronic pain medication at home and is taken this with no significant relief in symptoms.  She is also taken Zofran with no significant relief.  She is woken up in the night with sweating for the past 2 nights. No exertional or pleuritic chest pain.    Past Medical History:  Diagnosis Date  . Abdominal pain, chronic, left lower quadrant    a. s/p Laproscopy in 2004 w/o finding of source of discomfort.  . Anxiety and depression 2013   Lots of stress  . Bilateral pulmonary embolism (HCC) 08/08/2016   with right popliteal vein DVT--hospitalized and d/c'd home on eliquis.  . Chest pain    a. reportedly nl MV and echo 10/2011 - [place].  CP at the time of PE dx--was supposed to get stress test as outpt but she rescheduled x 1, then no showed.  . Cholelithiasis    a. s/p cholecystectomy 10/2011  . Chronic pain disorder   . Colon polyps    a. s/p colonoscopy and polypectomy in 2003  . Diverticulosis    with ? of 'itis  . Family history of colon cancer    mother  . GERD (gastroesophageal reflux disease)   . Hiatal hernia    a. small by EGD 2006.  [name] History of  amaurosis fugax 01/2011   L eye; saw neuro at [facility] (Dr. Johnell Comings).  Lab w/u and carotid doppler nl per old records.  . Hyperlipidemia    LDL 191 in 2013 per old records  . Hypertension   . Migraine   . RSD (reflex sympathetic dystrophy)    a. L shoulder--pain mgmt with Dr. Marlynn Perking at [facility] in [place]  . Tobacco abuse    a. 50 pack year hx.  . Visual disturbance    a. 12/2011 Left eye visual changes->w/u for TIA->Carotid U/S:  No ICA stenosis; Head CT: No acute abnl; MRI/MRA: mild RPCA stenosis; Echo: EF 55-60%, Triv AI, No PFO.    Patient Active Problem List   Diagnosis Date Noted  . Asymptomatic cholelithiasis 01/29/2017  . Diverticulosis 01/29/2017  . History of peptic ulcer 01/29/2017  . Hyperlipidemia 01/29/2017  . Headache 01/29/2017  . RSD (reflex sympathetic dystrophy) 01/29/2017  . RSD upper limb 01/29/2017  . Chronic narcotic use 12/25/2016  . Pleuritic chest pain 10/23/2016  . Dehydration 10/23/2016  . Leukocytosis 10/23/2016  . Tobacco abuse 10/23/2016  . 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 Bilateral pulmonary embolism (HCC) 08/08/2016  . Acute deep vein thrombosis (DVT) of right popliteal vein (HCC) 08/08/2016  . Chronic pain syndrome 11/24/2015  . Essential hypertension 11/24/2015  .  Complex regional pain syndrome I of upper limb 01/16/2013  . Lumbosacral radiculopathy 11/19/2012    Past Surgical History:  Procedure Laterality Date  . CESAREAN SECTION  1990  . COLONOSCOPY  10/2001   Dr. Ewing Schlein; diverticulosis, int/ext hem, one small polyp removed.  . ENDOMETRIAL ABLATION  2011  . ESOPHAGOGASTRODUODENOSCOPY  02/2004   Dr. Ewing Schlein; normal  . LAPAROSCOPIC CHOLECYSTECTOMY  10/19/11  . LAPAROSCOPY  05/2002   Dr. Harmon Dun cause for LLQ abd pain found  . SHOULDER SURGERY Left 1997 &98  . TRANSTHORACIC ECHOCARDIOGRAM  08/09/2016   Normal except grd I DD.  [name] UMBILICAL HERNIA REPAIR  2014   with  mesh    Allergies Flagyl [metronidazole]; Elavil [amitriptyline]; Klonopin [clonazepam]; and Levaquin [levofloxacin hemihydrate]  Family History  Problem Relation Age of Onset  . Dementia Father   . Parkinson's disease Father        d. 2013  . Hypertension Father   . Hyperlipidemia Father   . Heart disease Father   . Heart attack Mother        died @ 4  . Colon cancer Mother   . Hyperlipidemia Mother   . Heart disease Mother   . Hypertension Mother   . Esophageal cancer Brother   . Diabetes Neg Hx     Social History Social History   Tobacco Use  . Smoking status: Current Every Day Smoker    Packs/day: 0.50    Years: 30.00    Pack years: 15.00    Types: Cigarettes  . Smokeless tobacco: Never Used  Substance Use Topics  . Alcohol use: No  . Drug use: No    Review of Systems  Constitutional: No fever/chills Eyes: No visual changes. ENT: No sore throat. Cardiovascular: Positive chest pain. Respiratory: Denies shortness of breath. Positive cough.  Gastrointestinal: Positive LLQ abdominal pain. Positive nausea, no vomiting. Positive diarrhea.  No constipation. Genitourinary: Negative for dysuria. Musculoskeletal: Negative for back pain. Skin: Negative for rash. Neurological: Negative for headaches, focal weakness or numbness.  10-point ROS otherwise negative.  ____________________________________________   PHYSICAL EXAM:  VITAL SIGNS: Vitals:   07/30/17 1906 07/30/17 2006  BP: (!) 144/86 (!) 145/74  Pulse: 74 71  Resp: 14 14  SpO2: 94% 98%    Constitutional: Alert and oriented. Well appearing and in no acute distress. Eyes: Conjunctivae are normal.  Head: Atraumatic. Nose: No congestion/rhinnorhea. Mouth/Throat: Mucous membranes are moist.  Neck: No stridor.  Cardiovascular: Normal rate, regular rhythm. Good peripheral circulation. Grossly normal heart sounds.   Respiratory: Normal respiratory effort.  No retractions. Lungs  CTAB. Gastrointestinal: Soft with focal LLQ and left mid-abdominal tenderness. No rebound. No distention.  Musculoskeletal: No lower extremity tenderness nor edema. No gross deformities of extremities. Neurologic:  Normal speech and language. No gross focal neurologic deficits are appreciated.  Skin:  Skin is warm, dry and intact. No rash noted.  ____________________________________________   LABS (all labs ordered are listed, but only abnormal results are displayed)  Labs Reviewed  CBC WITH DIFFERENTIAL/PLATELET - Abnormal; Notable for the following components:      Result Value   WBC 13.7 (*)    Lymphs Abs 4.7 (*)    Monocytes Absolute 1.1 (*)    All other components within normal limits  TROPONIN I - Abnormal; Notable for the following components:   Troponin I 0.03 (*)    All other components within normal limits  TROPONIN I - Abnormal; Notable for the following components:   Troponin I  0.03 (*)    All other components within normal limits  LIPASE, BLOOD  COMPREHENSIVE METABOLIC PANEL   ____________________________________________  EKG   EKG Interpretation  Date/Time:  Wednesday July 30 2017 16:37:50 EDT Ventricular Rate:  90 PR Interval:    QRS Duration: 83 QT Interval:  360 QTC Calculation: 441 R Axis:   53 Text Interpretation:  Sinus rhythm Probable left atrial enlargement No STEMI.  Confirmed by Alona BeneLong, Joshua [phone]([id number]) on 07/30/2017 4:45:28 PM       ____________________________________________  RADIOLOGY  Dg Chest 2 View  Result Date: 07/30/2017 CLINICAL DATA:  Mid chest discomfort with epigastric pain beginning after lunch today. Nausea and diarrhea for 3 days. EXAM: CHEST - 2 VIEW COMPARISON:  Chest x-ray dated 04/27/2017. FINDINGS: The heart size and mediastinal contours are within normal limits. Both lungs are clear. The visualized skeletal structures are unremarkable. IMPRESSION: No active cardiopulmonary disease. No evidence of pneumonia or pulmonary edema.  Electronically Signed   By: Bary RichardStan  Maynard M.D.   On: 07/30/2017 18:02   Ct Abdomen Pelvis W Contrast  Result Date: 07/30/2017 CLINICAL DATA:  Acute left lower quadrant abdominal pain. EXAM: CT ABDOMEN AND PELVIS WITH CONTRAST TECHNIQUE: Multidetector CT imaging of the abdomen and pelvis was performed using the standard protocol following bolus administration of intravenous contrast. CONTRAST:  100mL ISOVUE-300 IOPAMIDOL (ISOVUE-300) INJECTION 61% COMPARISON:  CT scan of March 03, 2017. FINDINGS: Lower chest: No acute abnormality. Hepatobiliary: No focal liver abnormality is seen. Status post cholecystectomy. Stable dilated common bile duct is noted which most likely is due to post cholecystectomy status. Pancreas: Unremarkable. No pancreatic ductal dilatation or surrounding inflammatory changes. Spleen: Normal in size without focal abnormality. Adrenals/Urinary Tract: Adrenal glands appear normal. Bilateral renal cortical scarring is noted. No hydronephrosis or renal obstruction is noted. No renal or ureteral calculi are noted. Urinary bladder is unremarkable. Stomach/Bowel: Stomach is within normal limits. Appendix appears normal. No evidence of bowel wall thickening, distention, or inflammatory changes. Sigmoid diverticulosis is noted without inflammation. Vascular/Lymphatic: Aortic atherosclerosis. No enlarged abdominal or pelvic lymph nodes. Reproductive: Uterus and bilateral adnexa are unremarkable. Other: No abdominal wall hernia or abnormality. No abdominopelvic ascites. Musculoskeletal: No acute or significant osseous findings. IMPRESSION: Sigmoid diverticulosis without inflammation. No acute abnormality seen in the abdomen or pelvis. Aortic Atherosclerosis (ICD10-I70.0). Electronically Signed   By: Lupita RaiderJames  Green Jr, M.D.   On: 07/30/2017 19:40    ____________________________________________   PROCEDURES  Procedure(s) performed:    Procedures  None ____________________________________________   INITIAL IMPRESSION / ASSESSMENT AND PLAN / ED COURSE  Pertinent labs & imaging results that were available during my care of the patient were reviewed by me and considered in my medical decision making (see chart for details).  Patient presents to the emergency department for evaluation of central chest pressure which began today along with associated cough.  She is also had 3 days of left lower quadrant abdominal pain and diarrhea.  Her abdomen is focally tender in the left lower and mid abdomen.  She is experiencing some nausea and pain.  Suspicion for diverticulitis is elevated.  Given her exam plan for CT abdomen/pelvis and CXR.   The patient's chest pressure is very atypical for PE.  She denies dyspnea and is not hypoxic here.  She has had similar discomfort with prior pneumonia and does have a cough.  Plan also obtain troponin.  Lower suspicion for atypical ACS.  EKG reviewed with no acute findings.  Patient's CXR, labs, and CT are  reviewed with no acute findings. Troponin 0.03 x 2. No CP at this time. Referred to Cardiology for consideration of stress testing. Discussed return precautions if abdominal pain worsens or CP returns.   I have reviewed and discussed all results (EKG, imaging, lab, urine as appropriate), exam findings with patient. I have reviewed nursing notes and appropriate previous records.  I feel the patient is safe to be discharged home without further emergent workup. Discussed usual and customary return precautions. Patient and family (if present) verbalize understanding and are comfortable with this plan.  Patient will follow-up with their primary care provider. If they do not have a primary care provider, information for follow-up has been provided to them. All questions have been answered.  ____________________________________________  FINAL CLINICAL IMPRESSION(S) / ED DIAGNOSES  Final diagnoses:   Precordial chest pain  LLQ abdominal pain     MEDICATIONS GIVEN DURING THIS VISIT:  Medications  ondansetron (ZOFRAN) injection 4 mg (4 mg Intravenous Given 07/30/17 1702)  morphine 4 MG/ML injection 4 mg (4 mg Intravenous Given 07/30/17 1702)  iopamidol (ISOVUE-300) 61 % injection 100 mL (100 mLs Intravenous Contrast Given 07/30/17 1912)  oxyCODONE-acetaminophen (PERCOCET/ROXICET) 5-325 MG per tablet 2 tablet (2 tablets Oral Given 07/30/17 2017)    Note:  This document was prepared using Dragon voice recognition software and may include unintentional dictation errors.  Alona Bene, MD Emergency Medicine    Long, Arlyss Repress, MD 07/31/17 [phone]

## 2017-07-30 NOTE — Discharge Instructions (Signed)

## 2017-08-01 ENCOUNTER — Ambulatory Visit: Payer: Medicare HMO | Admitting: Cardiology

## 2017-08-24 MED ORDER — NITROGLYCERIN 0.4 MG SL SUBL
0.40 | SUBLINGUAL_TABLET | SUBLINGUAL | Status: DC
Start: ? — End: 2017-08-24

## 2017-09-15 ENCOUNTER — Other Ambulatory Visit: Payer: Self-pay

## 2017-09-15 ENCOUNTER — Emergency Department (HOSPITAL_COMMUNITY): Payer: Medicare HMO

## 2017-09-15 ENCOUNTER — Emergency Department (HOSPITAL_COMMUNITY)
Admission: EM | Admit: 2017-09-15 | Discharge: 2017-09-15 | Discharge status: Against Medical Advice | Payer: Medicare HMO | Attending: Emergency Medicine | Admitting: Emergency Medicine

## 2017-09-15 DIAGNOSIS — R1032 Left lower quadrant pain: Secondary | ICD-10-CM | POA: Diagnosis not present

## 2017-09-15 DIAGNOSIS — Z7901 Long term (current) use of anticoagulants: Secondary | ICD-10-CM | POA: Insufficient documentation

## 2017-09-15 DIAGNOSIS — H5462 Unqualified visual loss, left eye, normal vision right eye: Secondary | ICD-10-CM

## 2017-09-15 DIAGNOSIS — I10 Essential (primary) hypertension: Secondary | ICD-10-CM | POA: Diagnosis not present

## 2017-09-15 DIAGNOSIS — Z86711 Personal history of pulmonary embolism: Secondary | ICD-10-CM | POA: Diagnosis not present

## 2017-09-15 DIAGNOSIS — Z79899 Other long term (current) drug therapy: Secondary | ICD-10-CM | POA: Diagnosis not present

## 2017-09-15 DIAGNOSIS — F17210 Nicotine dependence, cigarettes, uncomplicated: Secondary | ICD-10-CM | POA: Diagnosis not present

## 2017-09-15 LAB — CBC
HEMATOCRIT: 44.5 % (ref 36.0–46.0)
HEMOGLOBIN: 15.1 g/dL — AB (ref 12.0–15.0)
MCH: 32.5 pg (ref 26.0–34.0)
MCHC: 33.9 g/dL (ref 30.0–36.0)
MCV: 95.7 fL (ref 78.0–100.0)
Platelets: 305 10*3/uL (ref 150–400)
RBC: 4.65 MIL/uL (ref 3.87–5.11)
RDW: 13.6 % (ref 11.5–15.5)
WBC: 13.1 10*3/uL — AB (ref 4.0–10.5)

## 2017-09-15 LAB — I-STAT BETA HCG BLOOD, ED (MC, WL, AP ONLY): HCG, QUANTITATIVE: 8.5 m[IU]/mL — AB (ref <5)

## 2017-09-15 LAB — BASIC METABOLIC PANEL
Anion gap: 10 (ref 5–15)
BUN: 18 mg/dL (ref 6–20)
CO2: 29 mmol/L (ref 22–32)
Calcium: 9.7 mg/dL (ref 8.9–10.3)
Chloride: 104 mmol/L (ref 98–111)
Creatinine, Ser: 0.82 mg/dL (ref 0.44–1.00)
GFR calc Af Amer: >60 mL/min (ref >60)
Glucose, Bld: 99 mg/dL (ref 70–99)
POTASSIUM: 4.2 mmol/L (ref 3.5–5.1)
SODIUM: 143 mmol/L (ref 135–145)

## 2017-09-15 LAB — CBG MONITORING, ED: Glucose-Capillary: 90 mg/dL (ref 70–99)

## 2017-09-15 MED ORDER — IOPAMIDOL (ISOVUE-300) INJECTION 61%
100.0000 mL | Freq: Once | INTRAVENOUS | Status: AC | PRN
  Administered 2017-09-15: 100 mL via INTRAVENOUS

## 2017-09-15 MED ORDER — ONDANSETRON HCL 4 MG/2ML IJ SOLN
4.0000 mg | Freq: Once | INTRAMUSCULAR | Status: AC
  Administered 2017-09-15: 4 mg via INTRAVENOUS
  Filled 2017-09-15: qty 2

## 2017-09-15 MED ORDER — MORPHINE SULFATE (PF) 4 MG/ML IV SOLN
8.0000 mg | Freq: Once | INTRAVENOUS | Status: AC
  Administered 2017-09-15: 8 mg via INTRAVENOUS
  Filled 2017-09-15: qty 2

## 2017-09-15 MED ORDER — IOPAMIDOL (ISOVUE-300) INJECTION 61%
INTRAVENOUS | Status: AC
  Filled 2017-09-15: qty 100

## 2017-09-15 MED ORDER — SODIUM CHLORIDE 0.9 % IJ SOLN
INTRAMUSCULAR | Status: AC
  Filled 2017-09-15: qty 50

## 2017-09-15 MED ORDER — MORPHINE SULFATE (PF) 4 MG/ML IV SOLN: 6.0000 mg | Freq: Once | INTRAVENOUS | Status: DC

## 2017-09-15 NOTE — ED Triage Notes (Addendum)
Patient reports left sided weakness and pain that started at 0300am. 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 Patient states this has happened previously but now says having jaw pain with the vision change. Patient also reports left sided back and abdominal pain. +nausea, +diarrhea, -vomiting. Patient has hx of RSD to left arm, arm chronically weaker than right. [place] negative. Hx blood clot to right leg, taken off xarelto 1 month ago.

## 2017-09-15 NOTE — ED Notes (Signed)
The pts son was in a car accident and she wants to leave. She stated that she would go straight to [facility] after. Dr. Freida Busman spoke with the pt. She understood that she was leaving AMA. She was ambulatory and CAOx4.

## 2017-09-15 NOTE — ED Notes (Signed)
Pt left leg was painful to lift off of the bed. She was able to lift it but it fell a little due to the pain. Pt is ambulatory. Hx of herniated disc in her back. Pain starts at thoracic region and travels up her arm and down her leg. Pain is sharp in nature.

## 2017-09-15 NOTE — ED Provider Notes (Addendum)
[facility]-EMERGENCY DEPT Provider Note   CSN: [id number] Arrival date & time: 09/15/17  1711     History   Chief Complaint Chief Complaint  Patient presents with  . Weakness  . Loss of Vision    HPI Dana Morrow is a 52 y.o. female.  52 year old female presents with acute onset of left-sided flank pain which radiates to her left lower quadrant.  Patient does have history of renal colic and thought that this was similar.  Denies any dysuria, hematuria, fever.  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  Review of the old record shows that this is been worked up and no real etiology has been found.  Patient states that last for about 3 minutes it has since resolved.  Denies any associated new weakness in upper or lower extremity's.  Denied any speech changes or confusion.  Patient has not use any medications prior to arrival.     Past Medical History:  Diagnosis Date  . Abdominal pain, chronic, left lower quadrant    a. s/p Laproscopy in 2004 w/o finding of source of discomfort.  . Anxiety and depression 2013   Lots of stress  . Bilateral pulmonary embolism (HCC) 08/08/2016   with right popliteal vein DVT--hospitalized and d/c'd home on eliquis.  . Chest pain    a. reportedly nl MV and echo 10/2011 - [place].  CP at the time of PE dx--was supposed to get stress test as outpt but she rescheduled x 1, then no showed.  . Cholelithiasis    a. s/p cholecystectomy 10/2011  . Chronic pain disorder   . Colon polyps    a. s/p colonoscopy and polypectomy in 2003  . Diverticulosis    with ? of 'itis  . Family history of colon cancer    mother  . GERD (gastroesophageal reflux disease)   . Hiatal hernia    a. small by EGD 2006.  [name] History of amaurosis fugax 01/2011   L eye; saw neuro at [facility] (Dr. Johnell Comings).  Lab w/u and carotid doppler nl per old records.  .  Hyperlipidemia    LDL 191 in 2013 per old records  . Hypertension   . Migraine   . RSD (reflex sympathetic dystrophy)    a. L shoulder--pain mgmt with Dr. Marlynn Perking at [facility] [place]  . Tobacco abuse    a. 50 pack year hx.  . Visual disturbance    a. 12/2011 Left eye visual changes->w/u for TIA->Carotid U/S:  No ICA stenosis; Head CT: No acute abnl; MRI/MRA: mild RPCA stenosis; Echo: EF 55-60%, Triv AI, No PFO.    Patient Active Problem List   Diagnosis Date Noted  . Asymptomatic cholelithiasis 01/29/2017  . Diverticulosis 01/29/2017  . History of peptic ulcer 01/29/2017  . Hyperlipidemia 01/29/2017  . Headache 01/29/2017  . RSD (reflex sympathetic dystrophy) 01/29/2017  . RSD upper limb 01/29/2017  . Chronic narcotic use 12/25/2016  . Pleuritic chest pain 10/23/2016  . Dehydration 10/23/2016  . Leukocytosis 10/23/2016  . Tobacco abuse 10/23/2016  . 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 Bilateral pulmonary embolism (HCC) 08/08/2016  . Acute deep vein thrombosis (DVT) of right popliteal vein (HCC) 08/08/2016  . Chronic pain syndrome 11/24/2015  . Essential hypertension 11/24/2015  . Complex regional pain syndrome I of upper limb 01/16/2013  . Lumbosacral radiculopathy 11/19/2012    Past Surgical History:  Procedure Laterality  Date  . CESAREAN SECTION  1990  . COLONOSCOPY  10/2001   Dr. Ewing Schlein; diverticulosis, int/ext hem, one small polyp removed.  . ENDOMETRIAL ABLATION  2011  . ESOPHAGOGASTRODUODENOSCOPY  02/2004   Dr. Ewing Schlein; normal  . LAPAROSCOPIC CHOLECYSTECTOMY  10/19/11  . LAPAROSCOPY  05/2002   Dr. Harmon Dun cause for LLQ abd pain found  . SHOULDER SURGERY Left 1997 &98  . TRANSTHORACIC ECHOCARDIOGRAM  08/09/2016   Normal except grd I DD.  [name] UMBILICAL HERNIA REPAIR  2014   with mesh     OB History   None      Home Medications    Prior to Admission medications   Medication Sig Start Date End Date Taking? Authorizing Provider    acetaminophen (TYLENOL) 325 MG tablet Take 650 mg by mouth every 6 (six) hours as needed for mild pain.    [provider]  cloNIDine (CATAPRES) 0.3 MG tablet TAKE ONE TABLET BY MOUTH 3 TIMES DAILY 04/23/17   McGowen, Maryjean Morn, MD  HYDROcodone-acetaminophen (NORCO/VICODIN) 5-325 MG tablet Take 1 tablet by mouth every 6 (six) hours as needed for moderate pain.  12/29/12   [provider]  HYDROmorphone (DILAUDID) 2 MG tablet Take by mouth every 4 (four) hours as needed for severe pain.    [provider]  metoprolol succinate (TOPROL-XL) 25 MG 24 hr tablet Take 25 mg by mouth daily.    [provider]  nitrofurantoin, macrocrystal-monohydrate, (MACROBID) 100 MG capsule Take 1 capsule (100 mg total) by mouth 2 (two) times daily. X 7 days Patient not taking: Reported on 04/23/2017 01/08/17   Palumbo, April, MD  ondansetron (ZOFRAN ODT) 4 MG disintegrating tablet Take 1 tablet (4 mg total) by mouth every 8 (eight) hours as needed for nausea or vomiting. 04/27/17   Little, Ambrose Finland, MD  oxyCODONE-acetaminophen (ROXICET) 5-325 MG tablet Take 1 tablet by mouth every 6 (six) hours as needed. Patient not taking: Reported on 04/23/2017 08/09/16   Dimple Nanas, MD  phenazopyridine (PYRIDIUM) 200 MG tablet Take 1 tablet (200 mg total) by mouth 3 (three) times daily. Patient not taking: Reported on 04/23/2017 01/08/17   [name], April, MD  rivaroxaban (XARELTO) 20 MG TABS tablet Take 1 tablet (20 mg total) by mouth daily with supper. 10/24/16   [name], MD    Family History Family History  Problem Relation Age of Onset  . Dementia Father   . Parkinson's disease Father        d. 2013  . Hypertension Father   . Hyperlipidemia Father   . Heart disease Father   . Heart attack Mother        died @ 70  . Colon cancer Mother   . Hyperlipidemia Mother   . Heart disease Mother   . Hypertension Mother   . Esophageal cancer Brother   . Diabetes Neg Hx     Social  History Social History   Tobacco Use  . Smoking status: Current Every Day Smoker    Packs/day: 0.50    Years: 30.00    Pack years: 15.00    Types: Cigarettes  . Smokeless tobacco: Never Used  Substance Use Topics  . Alcohol use: No  . Drug use: No     Allergies   Flagyl [metronidazole]; Elavil [amitriptyline]; Klonopin [clonazepam]; and Levaquin [levofloxacin hemihydrate]   Review of Systems Review of Systems  All other systems reviewed and are negative.    Physical Exam Updated Vital Signs BP [name])  178/110 (BP Location: Right Arm)   Pulse 97   Temp 98.2 F (36.8 C) (Oral)   Ht 1.6 m (5\' 3" )   Wt 83.5 kg   SpO2 98%   BMI 32.59 kg/m   Physical Exam  Constitutional: She is oriented to person, place, and time. She appears well-developed and well-nourished.  Non-toxic appearance. No distress.  HENT:  Head: Normocephalic and atraumatic.  Eyes: Pupils are equal, round, and reactive to light. Conjunctivae, EOM and lids are normal.  Neck: Normal range of motion. Neck supple. No tracheal deviation present. No thyroid mass present.  Cardiovascular: Normal rate, regular rhythm and normal heart sounds. Exam reveals no gallop.  No murmur heard. Pulmonary/Chest: Effort normal and breath sounds normal. No stridor. No respiratory distress. She has no decreased breath sounds. She has no wheezes. She has no rhonchi. She has no rales.  Abdominal: Soft. Normal appearance and bowel sounds are normal. She exhibits no distension. There is tenderness in the left lower quadrant. There is guarding. There is no rigidity, no rebound and no CVA tenderness.    Musculoskeletal: Normal range of motion. She exhibits no edema or tenderness.  Neurological: She is alert and oriented to person, place, and time. She has normal strength. She displays no tremor. No cranial nerve deficit or sensory deficit. Coordination normal. GCS eye subscore is 4. GCS verbal subscore is 5. GCS motor subscore is 6.  Skin:  Skin is warm and dry. No abrasion and no rash noted.  Psychiatric: She has a normal mood and affect. Her speech is normal and behavior is normal.  Nursing note and vitals reviewed.    ED Treatments / Results  Labs (all labs ordered are listed, but only abnormal results are displayed) Labs Reviewed  CBC - Abnormal; Notable for the following components:      Result Value   WBC 13.1 (*)    Hemoglobin 15.1 (*)    All other components within normal limits  I-STAT BETA HCG BLOOD, ED ([facility], [facility], [facility] ONLY) - Abnormal; Notable for the following components:   I-stat hCG, quantitative 8.5 (*)    All other components within normal limits  BASIC METABOLIC PANEL  URINALYSIS, ROUTINE W REFLEX MICROSCOPIC  CBG MONITORING, ED    EKG EKG Interpretation  Date/Time:  Monday September 15 2017 18:34:54 EDT Ventricular Rate:  86 PR Interval:    QRS Duration: 84 QT Interval:  378 QTC Calculation: 453 R Axis:   33 Text Interpretation:  Sinus rhythm Probable left atrial enlargement Anterior infarct, old No significant change since last tracing Confirmed by Lorre Nick ([id number]) on 09/15/2017 7:28:25 PM   Radiology No results found.  Procedures Procedures (including critical care time)  Medications Ordered in ED Medications  ondansetron (ZOFRAN) injection 4 mg (has no administration in time range)  morphine 4 MG/ML injection 8 mg (has no administration in time range)     Initial Impression / Assessment and Plan / ED Course  I have reviewed the triage vital signs and the nursing notes.  Pertinent labs & imaging results that were available during my care of the patient were reviewed by me and considered in my medical decision making (see chart for details).     Abdominal CT without evidence of acute pathology.  Patient has known history of chronic abdominal pain and was treated with IV morphine here and feels better.  Patient's old records reviewed extensively as she has a history of amaurosis  fugax.  Had a recent transthoracic echo  2 years ago which did not have any acute findings.  Case was discussed with Dr. Noland Fordyce from neurology who recommends patient have a repeat evaluation for possible TIA via hospital admission  10:01 PM Patient has decided that she needs to leave the hospital due to her son being in a car accident.  I informed her that she is at high risk for having a TIA and she accepts this.  States that she will go to [facility] after she deals with her personal situation.  Was encouraged to return as soon as possible Final Clinical Impressions(s) / ED Diagnoses   Final diagnoses:  None    ED Discharge Orders    None       Lorre Nick, MD 09/15/17 2147    Lorre Nick, MD 09/15/17 2147    Lorre Nick, MD 09/15/17 2202

## 2017-10-21 MED ORDER — SODIUM CHLORIDE 0.9 % IV SOLN
10.00 | INTRAVENOUS | Status: DC
Start: ? — End: 2017-10-21

## 2017-10-21 MED ORDER — GENERIC EXTERNAL MEDICATION
10.00 | Status: DC
Start: ? — End: 2017-10-21

## 2018-01-31 ENCOUNTER — Other Ambulatory Visit: Payer: Self-pay

## 2018-01-31 ENCOUNTER — Emergency Department (HOSPITAL_BASED_OUTPATIENT_CLINIC_OR_DEPARTMENT_OTHER)
Admission: EM | Admit: 2018-01-31 | Discharge: 2018-01-31 | Disposition: A | Discharge status: Home / Self Care | Payer: Medicare HMO | Attending: Emergency Medicine | Admitting: Emergency Medicine

## 2018-01-31 ENCOUNTER — Encounter (HOSPITAL_BASED_OUTPATIENT_CLINIC_OR_DEPARTMENT_OTHER): Payer: Self-pay | Admitting: *Deleted

## 2018-01-31 ENCOUNTER — Emergency Department (HOSPITAL_BASED_OUTPATIENT_CLINIC_OR_DEPARTMENT_OTHER): Payer: Medicare HMO

## 2018-01-31 DIAGNOSIS — I10 Essential (primary) hypertension: Secondary | ICD-10-CM | POA: Insufficient documentation

## 2018-01-31 DIAGNOSIS — F17210 Nicotine dependence, cigarettes, uncomplicated: Secondary | ICD-10-CM | POA: Insufficient documentation

## 2018-01-31 DIAGNOSIS — R1032 Left lower quadrant pain: Secondary | ICD-10-CM | POA: Insufficient documentation

## 2018-01-31 DIAGNOSIS — R197 Diarrhea, unspecified: Secondary | ICD-10-CM | POA: Diagnosis not present

## 2018-01-31 DIAGNOSIS — R110 Nausea: Secondary | ICD-10-CM | POA: Insufficient documentation

## 2018-01-31 DIAGNOSIS — Z79899 Other long term (current) drug therapy: Secondary | ICD-10-CM | POA: Diagnosis not present

## 2018-01-31 DIAGNOSIS — Z7901 Long term (current) use of anticoagulants: Secondary | ICD-10-CM | POA: Insufficient documentation

## 2018-01-31 LAB — URINALYSIS, ROUTINE W REFLEX MICROSCOPIC
Bilirubin Urine: NEGATIVE
Glucose, UA: NEGATIVE mg/dL
Hgb urine dipstick: NEGATIVE
Ketones, ur: NEGATIVE mg/dL
Leukocytes, UA: NEGATIVE
Nitrite: NEGATIVE
Protein, ur: NEGATIVE mg/dL
Specific Gravity, Urine: 1.02 (ref 1.005–1.030)
pH: 6 (ref 5.0–8.0)

## 2018-01-31 LAB — LIPASE, BLOOD: LIPASE: 27 U/L (ref 11–51)

## 2018-01-31 LAB — CBC
HCT: 43.9 % (ref 36.0–46.0)
Hemoglobin: 14.5 g/dL (ref 12.0–15.0)
MCH: 30.8 pg (ref 26.0–34.0)
MCHC: 33 g/dL (ref 30.0–36.0)
MCV: 93.2 fL (ref 80.0–100.0)
Platelets: 241 10*3/uL (ref 150–400)
RBC: 4.71 MIL/uL (ref 3.87–5.11)
RDW: 13.2 % (ref 11.5–15.5)
WBC: 12.2 10*3/uL — ABNORMAL HIGH (ref 4.0–10.5)
nRBC: 0 % (ref 0.0–0.2)

## 2018-01-31 LAB — COMPREHENSIVE METABOLIC PANEL
ALBUMIN: 3.9 g/dL (ref 3.5–5.0)
ALT: 11 U/L (ref 0–44)
AST: 15 U/L (ref 15–41)
Alkaline Phosphatase: 56 U/L (ref 38–126)
Anion gap: 7 (ref 5–15)
BUN: 17 mg/dL (ref 6–20)
CO2: 23 mmol/L (ref 22–32)
Calcium: 9.2 mg/dL (ref 8.9–10.3)
Chloride: 108 mmol/L (ref 98–111)
Creatinine, Ser: 0.77 mg/dL (ref 0.44–1.00)
GFR calc Af Amer: >60 mL/min (ref >60)
GFR calc non Af Amer: >60 mL/min (ref >60)
GLUCOSE: 100 mg/dL — AB (ref 70–99)
Potassium: 4.4 mmol/L (ref 3.5–5.1)
Sodium: 138 mmol/L (ref 135–145)
Total Bilirubin: 0.5 mg/dL (ref 0.3–1.2)
Total Protein: 7.1 g/dL (ref 6.5–8.1)

## 2018-01-31 LAB — PREGNANCY, URINE: Preg Test, Ur: NEGATIVE

## 2018-01-31 MED ORDER — SODIUM CHLORIDE 0.9% FLUSH
3.0000 mL | Freq: Once | INTRAVENOUS | Status: DC
  Filled 2018-01-31: qty 3

## 2018-01-31 MED ORDER — SODIUM CHLORIDE 0.9 % IV BOLUS
1000.0000 mL | Freq: Once | INTRAVENOUS | Status: AC
  Administered 2018-01-31: 1000 mL via INTRAVENOUS

## 2018-01-31 MED ORDER — ONDANSETRON HCL 4 MG/2ML IJ SOLN
4.0000 mg | Freq: Once | INTRAMUSCULAR | Status: AC
  Administered 2018-01-31: 4 mg via INTRAVENOUS
  Filled 2018-01-31: qty 2

## 2018-01-31 MED ORDER — HYDROMORPHONE HCL 1 MG/ML IJ SOLN
1.0000 mg | Freq: Once | INTRAMUSCULAR | Status: AC
  Administered 2018-01-31: 1 mg via INTRAVENOUS
  Filled 2018-01-31: qty 1

## 2018-01-31 MED ORDER — IOPAMIDOL (ISOVUE-300) INJECTION 61%
100.0000 mL | Freq: Once | INTRAVENOUS | Status: AC
  Administered 2018-01-31: 100 mL via INTRAVENOUS

## 2018-01-31 NOTE — Discharge Instructions (Signed)
If you develop worsening, continued, or recurrent abdominal pain, uncontrolled vomiting, fever, chest or back pain, or any other new/concerning symptoms then return to the ER for evaluation.  

## 2018-01-31 NOTE — ED Notes (Signed)
No emesis or diarrhea.  

## 2018-01-31 NOTE — ED Provider Notes (Signed)
[facility] EMERGENCY DEPARTMENT Provider Note   CSN: [id number] Arrival date & time: 01/31/18  1931     History   Chief Complaint Chief Complaint  Patient presents with  . Abdominal Pain    HPI Dana Morrow is a 53 y.o. female.  HPI  53 year old female presents with left lower abdominal pain.  Started 2 days ago.  No fevers or vomiting but she is having nausea.  She is also had diarrhea without blood.  The pain is currently severe and as bad as she has had it in the past.  Feels like prior diverticulitis.  Some flank pain on the left side as well.  No urinary symptoms.  She is on home Dilaudid by mouth and has been taking this without relief.  Past Medical History:  Diagnosis Date  . Abdominal pain, chronic, left lower quadrant    a. s/p Laproscopy in 2004 w/o finding of source of discomfort.  . Anxiety and depression 2013   Lots of stress  . Bilateral pulmonary embolism (HCC) 08/08/2016   with right popliteal vein DVT--hospitalized and d/c'd home on eliquis.  . Chest pain    a. reportedly nl MV and echo 10/2011 - [place].  CP at the time of PE dx--was supposed to get stress test as outpt but she rescheduled x 1, then no showed.  . Cholelithiasis    a. s/p cholecystectomy 10/2011  . Chronic pain disorder   . Colon polyps    a. s/p colonoscopy and polypectomy in 2003  . Diverticulosis    with ? of 'itis  . Family history of colon cancer    mother  . GERD (gastroesophageal reflux disease)   . Hiatal hernia    a. small by EGD 2006.  [name]. History of amaurosis fugax 01/2011   L eye; saw neuro at [facility] (Dr. Johnell ComingsMieden).  Lab w/u and carotid doppler nl per old records.  . Hyperlipidemia    LDL 191 in 2013 per old records  . Hypertension   . Migraine   . RSD (reflex sympathetic dystrophy)    a. L shoulder--pain mgmt with Dr. Marlynn [facility]kingMalloy [facility] [place]  . Tobacco abuse    a. 50 pack year hx.  . Visual  disturbance    a. 12/2011 Left eye visual changes->w/u for TIA->Carotid U/S:  No ICA stenosis; Head CT: No acute abnl; MRI/MRA: mild RPCA stenosis; Echo: EF 55-60%, Triv AI, No PFO.    Patient Active Problem List   Diagnosis Date Noted  . Asymptomatic cholelithiasis 01/29/2017  . Diverticulosis 01/29/2017  . History of peptic ulcer 01/29/2017  . Hyperlipidemia 01/29/2017  . Headache 01/29/2017  . RSD (reflex sympathetic dystrophy) 01/29/2017  . RSD upper limb 01/29/2017  . Chronic narcotic use 12/25/2016  . Pleuritic chest pain 10/23/2016  . Dehydration 10/23/2016  . Leukocytosis 10/23/2016  . Tobacco abuse 10/23/2016  . 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 Bilateral pulmonary embolism (HCC) 08/08/2016  . Acute deep vein thrombosis (DVT) of right popliteal vein (HCC) 08/08/2016  . Chronic pain syndrome 11/24/2015  . Essential hypertension 11/24/2015  . Complex regional pain syndrome I of upper limb 01/16/2013  . Lumbosacral radiculopathy 11/19/2012    Past Surgical History:  Procedure Laterality Date  . CESAREAN SECTION  1990  . COLONOSCOPY  10/2001   Dr. Ewing SchleinMagod; diverticulosis, int/ext hem, one small polyp removed.  . ENDOMETRIAL ABLATION  2011  . ESOPHAGOGASTRODUODENOSCOPY  02/2004   Dr. Ewing SchleinMagod; normal  .  LAPAROSCOPIC CHOLECYSTECTOMY  10/19/11  . LAPAROSCOPY  05/2002   Dr. Harmon Dun cause for LLQ abd pain found  . SHOULDER SURGERY Left 1997 &98  . TRANSTHORACIC ECHOCARDIOGRAM  08/09/2016   Normal except grd I DD.  [name] UMBILICAL HERNIA REPAIR  2014   with mesh     OB History   No obstetric history on file.      Home Medications    Prior to Admission medications   Medication Sig Start Date End Date Taking? Authorizing Provider  cloNIDine (CATAPRES) 0.3 MG tablet TAKE ONE TABLET BY MOUTH 3 TIMES DAILY 04/23/17  Yes McGowen, Maryjean Morn, MD  HYDROmorphone (DILAUDID) 2 MG tablet Take 2 mg by mouth every 4 (four) hours as needed for moderate pain or severe pain.     Yes [provider]  metoprolol succinate (TOPROL-XL) 50 MG 24 hr tablet Take 50 mg by mouth daily. 08/22/17  Yes [provider]  oxyCODONE-acetaminophen (PERCOCET) 10-325 MG tablet Take 1 tablet by mouth every 6 (six) hours as needed. 08/22/17  Yes [provider]  nitrofurantoin, macrocrystal-monohydrate, (MACROBID) 100 MG capsule Take 1 capsule (100 mg total) by mouth 2 (two) times daily. X 7 days Patient not taking: Reported on 04/23/2017 01/08/17   Palumbo, April, MD  ondansetron (ZOFRAN ODT) 4 MG disintegrating tablet Take 1 tablet (4 mg total) by mouth every 8 (eight) hours as needed for nausea or vomiting. Patient not taking: Reported on 09/15/2017 04/27/17   Little, Ambrose Finland, MD  oxyCODONE-acetaminophen (ROXICET) 5-325 MG tablet Take 1 tablet by mouth every 6 (six) hours as needed. Patient not taking: Reported on 09/15/2017 08/09/16   Dimple Nanas, MD  phenazopyridine (PYRIDIUM) 200 MG tablet Take 1 tablet (200 mg total) by mouth 3 (three) times daily. Patient not taking: Reported on 04/23/2017 01/08/17   [name], April, MD  rivaroxaban (XARELTO) 20 MG TABS tablet Take 1 tablet (20 mg total) by mouth daily with supper. Patient not taking: Reported on 09/15/2017 10/24/16   [name], MD    Family History Family History  Problem Relation Age of Onset  . Dementia Father   . Parkinson's disease Father        d. 2013  . Hypertension Father   . Hyperlipidemia Father   . Heart disease Father   . Heart attack Mother        died @ 41  . Colon cancer Mother   . Hyperlipidemia Mother   . Heart disease Mother   . Hypertension Mother   . Esophageal cancer Brother   . Diabetes Neg Hx     Social History Social History   Tobacco Use  . Smoking status: Current Every Day Smoker    Packs/day: 0.50    Years: 30.00    Pack years: 15.00    Types: Cigarettes  . Smokeless tobacco: Never Used  Substance Use Topics  . Alcohol use: No  . Drug use: No      Allergies   Flagyl [metronidazole]; Elavil [amitriptyline]; Klonopin [clonazepam]; and Levaquin [levofloxacin hemihydrate]   Review of Systems Review of Systems  Constitutional: Negative for fever.  Gastrointestinal: Positive for abdominal pain, diarrhea and nausea. Negative for blood in stool and vomiting.  Genitourinary: Negative for dysuria.  All other systems reviewed and are negative.    Physical Exam Updated Vital Signs BP 134/80 (BP Location: Right Arm)   Pulse 72   Temp 98.2 F (36.8 C) (Oral)   Resp 16   Ht 5'  3" (1.6 m)   Wt 83.9 kg   SpO2 100%   BMI 32.77 kg/m   Physical Exam Vitals signs and nursing note reviewed.  Constitutional:      General: She is not in acute distress.    Appearance: She is well-developed. She is obese.  HENT:     Head: Normocephalic and atraumatic.     Right Ear: External ear normal.     Left Ear: External ear normal.     Nose: Nose normal.  Eyes:     General:        Right eye: No discharge.        Left eye: No discharge.  Cardiovascular:     Rate and Rhythm: Normal rate and regular rhythm.     Heart sounds: Normal heart sounds.  Pulmonary:     Effort: Pulmonary effort is normal.     Breath sounds: Normal breath sounds.  Abdominal:     Palpations: Abdomen is soft.     Tenderness: There is abdominal tenderness in the left lower quadrant.     Comments: Abdomen is soft. No tenderness except for point tenderness in LLQ that causes patient to jump  Skin:    General: Skin is warm and dry.  Neurological:     Mental Status: She is alert.  Psychiatric:        Mood and Affect: Mood is not anxious.      ED Treatments / Results  Labs (all labs ordered are listed, but only abnormal results are displayed) Labs Reviewed  COMPREHENSIVE METABOLIC PANEL - Abnormal; Notable for the following components:      Result Value   Glucose, Bld 100 (*)    All other components within normal limits  CBC - Abnormal; Notable for the  following components:   WBC 12.2 (*)    All other components within normal limits  LIPASE, BLOOD  URINALYSIS, ROUTINE W REFLEX MICROSCOPIC  PREGNANCY, URINE    EKG None  Radiology Ct Abdomen Pelvis W Contrast  Result Date: 01/31/2018 CLINICAL DATA:  Left lower quadrant pain for 3 days EXAM: CT ABDOMEN AND PELVIS WITH CONTRAST TECHNIQUE: Multidetector CT imaging of the abdomen and pelvis was performed using the standard protocol following bolus administration of intravenous contrast. CONTRAST:  ISOVUE-300 IOPAMIDOL (ISOVUE-300) INJECTION 61% COMPARISON:  09/15/17 FINDINGS: Lower chest: No acute abnormality. Hepatobiliary: No focal liver abnormality is seen. Status post cholecystectomy. Dilation of the common bile duct is noted consistent with the post cholecystectomy state. Pancreas: Unremarkable. No pancreatic ductal dilatation or surrounding inflammatory changes. Spleen: Normal in size without focal abnormality. Adrenals/Urinary Tract: Adrenal glands are within normal limits. Kidneys are well visualized bilaterally. No renal calculi or obstructive changes are noted. Delayed images demonstrate normal excretion of contrast. The bladder is decompressed. Stomach/Bowel: Diverticular change of the colon is noted without definitive diverticulitis. No obstructive or inflammatory changes are seen. The appendix is air-filled and within normal limits. No small bowel abnormality is seen. Vascular/Lymphatic: Aortic atherosclerosis. No enlarged abdominal or pelvic lymph nodes. Reproductive: Uterus and bilateral adnexa are unremarkable. Other: No abdominal wall hernia or abnormality. No abdominopelvic ascites. Musculoskeletal: No acute or significant osseous findings. IMPRESSION: Diverticular change without evidence of diverticulitis Status post cholecystectomy with dilated common bile duct stable from the previous exam. No acute abnormality noted. Electronically Signed   By: Alcide Clever M.D.   On: 01/31/2018  21:58    Procedures Procedures (including critical care time)  Medications Ordered in ED Medications  sodium  chloride flush (NS) 0.9 % injection 3 mL (3 mLs Intravenous Not Given 01/31/18 2107)  HYDROmorphone (DILAUDID) injection 1 mg (1 mg Intravenous Given 01/31/18 2103)  ondansetron (ZOFRAN) injection 4 mg (4 mg Intravenous Given 01/31/18 2103)  sodium chloride 0.9 % bolus 1,000 mL ( Intravenous Stopped 01/31/18 2228)  iopamidol (ISOVUE-300) 61 % injection 100 mL (100 mLs Intravenous Contrast Given 01/31/18 2127)     Initial Impression / Assessment and Plan / ED Course  I have reviewed the triage vital signs and the nursing notes.  Pertinent labs & imaging results that were available during my care of the patient were reviewed by me and considered in my medical decision making (see chart for details).     Patient CT scan does not show any acute intra-abdominal emergency such as diverticulitis or abscess.  She has had many negative CTs in the past but given the degree of tenderness she elicited, the mild elevation of WBC, I think CT was necessary.  Otherwise, this appears to be a chronic pain issue and she will need follow-up with her PCP/GI.  Discharged home with return precautions.  Final Clinical Impressions(s) / ED Diagnoses   Final diagnoses:  Left lower quadrant pain    ED Discharge Orders    None       Pricilla LovelessGoldston, Willian Donson, MD 01/31/18 2325

## 2018-01-31 NOTE — ED Triage Notes (Signed)
Pt reports 3 days of LLQ pain, consistent with diverticulitis pain she had had in the past

## 2018-01-31 NOTE — ED Notes (Signed)
Patient transported to CT 

## 2018-07-11 ENCOUNTER — Other Ambulatory Visit: Payer: Self-pay

## 2018-07-11 ENCOUNTER — Emergency Department (HOSPITAL_BASED_OUTPATIENT_CLINIC_OR_DEPARTMENT_OTHER): Payer: Medicare HMO

## 2018-07-11 ENCOUNTER — Encounter (HOSPITAL_BASED_OUTPATIENT_CLINIC_OR_DEPARTMENT_OTHER): Payer: Self-pay | Admitting: Emergency Medicine

## 2018-07-11 ENCOUNTER — Emergency Department (HOSPITAL_BASED_OUTPATIENT_CLINIC_OR_DEPARTMENT_OTHER)
Admission: EM | Admit: 2018-07-11 | Discharge: 2018-07-11 | Disposition: A | Discharge status: Home / Self Care | Payer: Medicare HMO | Attending: Emergency Medicine | Admitting: Emergency Medicine

## 2018-07-11 DIAGNOSIS — F17210 Nicotine dependence, cigarettes, uncomplicated: Secondary | ICD-10-CM | POA: Diagnosis not present

## 2018-07-11 DIAGNOSIS — F419 Anxiety disorder, unspecified: Secondary | ICD-10-CM | POA: Insufficient documentation

## 2018-07-11 DIAGNOSIS — R109 Unspecified abdominal pain: Secondary | ICD-10-CM

## 2018-07-11 DIAGNOSIS — I10 Essential (primary) hypertension: Secondary | ICD-10-CM | POA: Insufficient documentation

## 2018-07-11 DIAGNOSIS — Z79899 Other long term (current) drug therapy: Secondary | ICD-10-CM | POA: Insufficient documentation

## 2018-07-11 DIAGNOSIS — E785 Hyperlipidemia, unspecified: Secondary | ICD-10-CM | POA: Insufficient documentation

## 2018-07-11 LAB — URINALYSIS, ROUTINE W REFLEX MICROSCOPIC
Bilirubin Urine: NEGATIVE
Glucose, UA: NEGATIVE mg/dL
Hgb urine dipstick: NEGATIVE
Ketones, ur: NEGATIVE mg/dL
Leukocytes,Ua: NEGATIVE
Nitrite: NEGATIVE
Protein, ur: NEGATIVE mg/dL
Specific Gravity, Urine: 1.015 (ref 1.005–1.030)
pH: 6 (ref 5.0–8.0)

## 2018-07-11 LAB — BASIC METABOLIC PANEL
Anion gap: 11 (ref 5–15)
BUN: 17 mg/dL (ref 6–20)
CO2: 20 mmol/L — ABNORMAL LOW (ref 22–32)
Calcium: 9.1 mg/dL (ref 8.9–10.3)
Chloride: 108 mmol/L (ref 98–111)
Creatinine, Ser: 0.78 mg/dL (ref 0.44–1.00)
GFR calc Af Amer: >60 mL/min (ref >60)
GFR calc non Af Amer: >60 mL/min (ref >60)
Glucose, Bld: 100 mg/dL — ABNORMAL HIGH (ref 70–99)
Potassium: 4.4 mmol/L (ref 3.5–5.1)
Sodium: 139 mmol/L (ref 135–145)

## 2018-07-11 LAB — HEPATIC FUNCTION PANEL
ALT: 15 U/L (ref 0–44)
AST: 17 U/L (ref 15–41)
Albumin: 4.1 g/dL (ref 3.5–5.0)
Alkaline Phosphatase: 62 U/L (ref 38–126)
Bilirubin, Direct: 0.1 mg/dL (ref 0.0–0.2)
Indirect Bilirubin: 0.5 mg/dL (ref 0.3–0.9)
Total Bilirubin: 0.6 mg/dL (ref 0.3–1.2)
Total Protein: 7.4 g/dL (ref 6.5–8.1)

## 2018-07-11 LAB — CBC
HCT: 46.9 % — ABNORMAL HIGH (ref 36.0–46.0)
Hemoglobin: 15.5 g/dL — ABNORMAL HIGH (ref 12.0–15.0)
MCH: 31.3 pg (ref 26.0–34.0)
MCHC: 33 g/dL (ref 30.0–36.0)
MCV: 94.6 fL (ref 80.0–100.0)
Platelets: 212 10*3/uL (ref 150–400)
RBC: 4.96 MIL/uL (ref 3.87–5.11)
RDW: 13.3 % (ref 11.5–15.5)
WBC: 9.7 10*3/uL (ref 4.0–10.5)
nRBC: 0 % (ref 0.0–0.2)

## 2018-07-11 LAB — LIPASE, BLOOD: Lipase: 31 U/L (ref 11–51)

## 2018-07-11 MED ORDER — ONDANSETRON HCL 4 MG/2ML IJ SOLN
4.0000 mg | Freq: Once | INTRAMUSCULAR | Status: AC
  Administered 2018-07-11: 4 mg via INTRAVENOUS
  Filled 2018-07-11: qty 2

## 2018-07-11 MED ORDER — MORPHINE SULFATE (PF) 4 MG/ML IV SOLN
4.0000 mg | Freq: Once | INTRAVENOUS | Status: AC
  Administered 2018-07-11: 4 mg via INTRAVENOUS
  Filled 2018-07-11: qty 1

## 2018-07-11 MED ORDER — KETOROLAC TROMETHAMINE 30 MG/ML IJ SOLN
30.0000 mg | Freq: Once | INTRAMUSCULAR | Status: AC
  Administered 2018-07-11: 30 mg via INTRAVENOUS
  Filled 2018-07-11: qty 1

## 2018-07-11 MED ORDER — HYDROMORPHONE HCL 1 MG/ML IJ SOLN
1.0000 mg | Freq: Once | INTRAMUSCULAR | Status: AC
  Administered 2018-07-11: 1 mg via INTRAVENOUS
  Filled 2018-07-11: qty 1

## 2018-07-11 MED ORDER — SODIUM CHLORIDE 0.9 % IV BOLUS
1000.0000 mL | Freq: Once | INTRAVENOUS | Status: AC
  Administered 2018-07-11: 1000 mL via INTRAVENOUS

## 2018-07-11 NOTE — ED Triage Notes (Signed)
R side back pain, dysuria, and hematuria since yesterday .

## 2018-07-11 NOTE — ED Notes (Signed)
Patient transported to CT 

## 2018-07-11 NOTE — ED Notes (Signed)
ED Provider at bedside. 

## 2018-07-11 NOTE — Discharge Instructions (Addendum)
Your prior to results today were normal.  Your CT showed no presents down.  Follow-up with your primary care as needed.  If you experience any fever, worsening symptoms he return to the emergency department.

## 2018-07-11 NOTE — ED Provider Notes (Signed)
[facility] EMERGENCY DEPARTMENT Provider Note   CSN: [id number] Arrival date & time: 07/11/18  1416    History   Chief Complaint Chief Complaint  Patient presents with  . Back Pain  . Hematuria    HPI Dana Morrow is a [age] y.o. female.     53 y.o female with a PMH of Anxiety, Depression, GERD, Chronic pain disorder presents to the ED with a chief complaint of right flank pain which began 3 days ago but woke her up from her sleep this morning. She had this intermittent shrap pain originating on the right side of her back to her right flank.  She reports she urinated this morning and had hematuria, dysuria.  Reports the pain is better with moving.  She also endorses nausea along with suprapubic pressure.  She has a prior history of kidney stones but is unsure whether this episode was similar to her last one. She denies any fever, chills, vomiting or weakness.   The history is provided by the patient.  Back Pain Associated symptoms: dysuria   Associated symptoms: no abdominal pain, no chest pain, no fever and no pelvic pain   Hematuria Pertinent negatives include no chest pain, no abdominal pain and no shortness of breath.    Past Medical History:  Diagnosis Date  . Abdominal pain, chronic, left lower quadrant    a. s/p Laproscopy in 2004 w/o finding of source of discomfort.  . Anxiety and depression 2013   Lots of stress  . Bilateral pulmonary embolism (HCC) 08/08/2016   with right popliteal vein DVT--hospitalized and d/c'd home on eliquis.  . Chest pain    a. reportedly nl MV and echo 10/2011 - [place].  CP at the time of PE dx--was supposed to get stress test as outpt but she rescheduled x 1, then no showed.  . Cholelithiasis    a. s/p cholecystectomy 10/2011  . Chronic pain disorder   . Colon polyps    a. s/p colonoscopy and polypectomy in 2003  . Diverticulosis    with ? of 'itis  . Family history of colon cancer    mother  . GERD (gastroesophageal  reflux disease)   . Hiatal hernia    a. small by EGD 2006.  [name]. History of amaurosis fugax 01/2011   L eye; saw neuro at [facility] (Dr. Johnell ComingsMieden).  Lab w/u and carotid doppler nl per old records.  . Hyperlipidemia    LDL 191 in 2013 per old records  . Hypertension   . Migraine   . RSD (reflex sympathetic dystrophy)    a. L shoulder--pain mgmt with Dr. Marlynn [facility]kingMalloy [facility] [place]  . Tobacco abuse    a. 50 pack year hx.  . Visual disturbance    a. 12/2011 Left eye visual changes->w/u for TIA->Carotid U/S:  No ICA stenosis; Head CT: No acute abnl; MRI/MRA: mild RPCA stenosis; Echo: EF 55-60%, Triv AI, No PFO.    Patient Active Problem List   Diagnosis Date Noted  . Asymptomatic cholelithiasis 01/29/2017  . Diverticulosis 01/29/2017  . History of peptic ulcer 01/29/2017  . Hyperlipidemia 01/29/2017  . Headache 01/29/2017  . RSD (reflex sympathetic dystrophy) 01/29/2017  . RSD upper limb 01/29/2017  . Chronic narcotic use 12/25/2016  . Pleuritic chest pain 10/23/2016  . Dehydration 10/23/2016  . Leukocytosis 10/23/2016  . Tobacco abuse 10/23/2016  . 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 Bilateral pulmonary embolism (HCC) 08/08/2016  . Acute deep  vein thrombosis (DVT) of right popliteal vein (HCC) 08/08/2016  . Chronic pain syndrome 11/24/2015  . Essential hypertension 11/24/2015  . Complex regional pain syndrome I of upper limb 01/16/2013  . Lumbosacral radiculopathy 11/19/2012    Past Surgical History:  Procedure Laterality Date  . CESAREAN SECTION  1990  . COLONOSCOPY  10/2001   Dr. Ewing SchleinMagod; diverticulosis, int/ext hem, one small polyp removed.  . ENDOMETRIAL ABLATION  2011  . ESOPHAGOGASTRODUODENOSCOPY  02/2004   Dr. Ewing SchleinMagod; normal  . LAPAROSCOPIC CHOLECYSTECTOMY  10/19/11  . LAPAROSCOPY  05/2002   Dr. Harmon Dunimothy Davis--no cause for LLQ abd pain found  . SHOULDER SURGERY Left 1997 &98  . TRANSTHORACIC ECHOCARDIOGRAM   08/09/2016   Normal except grd I DD.  [name]. UMBILICAL HERNIA REPAIR  2014   with mesh     OB History   No obstetric history on file.      Home Medications    Prior to Admission medications   Medication Sig Start Date End Date Taking? Authorizing Provider  cloNIDine (CATAPRES) 0.3 MG tablet TAKE ONE TABLET BY MOUTH 3 TIMES DAILY 04/23/17   McGowen, Maryjean MornPhilip H, MD  HYDROmorphone (DILAUDID) 2 MG tablet Take 2 mg by mouth every 4 (four) hours as needed for moderate pain or severe pain.     [provider]  metoprolol succinate (TOPROL-XL) 50 MG 24 hr tablet Take 50 mg by mouth daily. 08/22/17   [provider]  nitrofurantoin, macrocrystal-monohydrate, (MACROBID) 100 MG capsule Take 1 capsule (100 mg total) by mouth 2 (two) times daily. X 7 days Patient not taking: Reported on 04/23/2017 01/08/17   Palumbo, April, MD  ondansetron (ZOFRAN ODT) 4 MG disintegrating tablet Take 1 tablet (4 mg total) by mouth every 8 (eight) hours as needed for nausea or vomiting. Patient not taking: Reported on 09/15/2017 04/27/17   Little, Ambrose Finlandachel Morgan, MD  oxyCODONE-acetaminophen (PERCOCET) 10-325 MG tablet Take 1 tablet by mouth every 6 (six) hours as needed. 08/22/17   [provider]  oxyCODONE-acetaminophen (ROXICET) 5-325 MG tablet Take 1 tablet by mouth every 6 (six) hours as needed. Patient not taking: Reported on 09/15/2017 08/09/16   Dimple NanasAmin, Ankit Chirag, MD  phenazopyridine (PYRIDIUM) 200 MG tablet Take 1 tablet (200 mg total) by mouth 3 (three) times daily. Patient not taking: Reported on 04/23/2017 01/08/17   Nicanor AlconPalumbo, April, MD  rivaroxaban (XARELTO) 20 MG TABS tablet Take 1 tablet (20 mg total) by mouth daily with supper. Patient not taking: Reported on 09/15/2017 10/24/16   Tyrone NineGrunz, Ryan B, MD    Family History Family History  Problem Relation Age of Onset  . Dementia Father   . Parkinson's disease Father        d. 2013  . Hypertension Father   . Hyperlipidemia Father   . Heart disease  Father   . Heart attack Mother        died @ 3167  . Colon cancer Mother   . Hyperlipidemia Mother   . Heart disease Mother   . Hypertension Mother   . Esophageal cancer Brother   . Diabetes Neg Hx     Social History Social History   Tobacco Use  . Smoking status: Current Every Day Smoker    Packs/day: 0.50    Years: 30.00    Pack years: 15.00    Types: Cigarettes  . Smokeless tobacco: Never Used  Substance Use Topics  . Alcohol use: No  . Drug use: No     Allergies  Flagyl [metronidazole], Elavil [amitriptyline], Klonopin [clonazepam], and Levaquin [levofloxacin hemihydrate]   Review of Systems Review of Systems  Constitutional: Negative for chills and fever.  HENT: Negative for ear pain and sore throat.   Eyes: Negative for pain and visual disturbance.  Respiratory: Negative for cough and shortness of breath.   Cardiovascular: Negative for chest pain and palpitations.  Gastrointestinal: Negative for abdominal pain and vomiting.  Genitourinary: Positive for dysuria, flank pain (right) and hematuria. Negative for decreased urine volume, pelvic pain, urgency, vaginal bleeding and vaginal discharge.  Musculoskeletal: Positive for back pain. Negative for arthralgias.  Skin: Negative for color change and rash.  Neurological: Negative for seizures and syncope.  All other systems reviewed and are negative.    Physical Exam Updated Vital Signs BP (!) 170/85   Pulse 78   Temp 98.7 F (37.1 C) (Oral)   Resp 16   Ht  (1.6 m)   Wt 83.9 kg   SpO2 94%   BMI 32.77 kg/m   Physical Exam Vitals signs and nursing note reviewed.  Constitutional:      General: She is not in acute distress.    Appearance: She is well-developed.     Comments: Non ill appearing.   HENT:     Head: Normocephalic and atraumatic.     Mouth/Throat:     Pharynx: No oropharyngeal exudate.  Eyes:     Pupils: Pupils are equal, round, and reactive to light.  Neck:     Musculoskeletal:  Normal range of motion.  Cardiovascular:     Rate and Rhythm: Regular rhythm.     Heart sounds: Normal heart sounds.  Pulmonary:     Effort: Pulmonary effort is normal. No respiratory distress.     Breath sounds: Normal breath sounds.  Abdominal:     General: Bowel sounds are normal. There is no distension.     Palpations: Abdomen is soft. There is no hepatomegaly or mass.     Tenderness: There is no abdominal tenderness. There is right CVA tenderness. There is no left CVA tenderness.     Hernia: No hernia is present.  Musculoskeletal:        General: No tenderness or deformity.     Right lower leg: No edema.     Left lower leg: No edema.  Skin:    General: Skin is warm and dry.  Neurological:     Mental Status: She is alert and oriented to person, place, and time.      ED Treatments / Results  Labs (all labs ordered are listed, but only abnormal results are displayed) Labs Reviewed  URINALYSIS, ROUTINE W REFLEX MICROSCOPIC - Abnormal; Notable for the following components:      Result Value   Color, Urine STRAW (*)    All other components within normal limits  CBC - Abnormal; Notable for the following components:   Hemoglobin 15.5 (*)    HCT 46.9 (*)    All other components within normal limits  BASIC METABOLIC PANEL - Abnormal; Notable for the following components:   CO2 20 (*)    Glucose, Bld 100 (*)    All other components within normal limits  URINE CULTURE  HEPATIC FUNCTION PANEL  LIPASE, BLOOD    EKG None  Radiology Ct Renal Stone Study  Result Date: 07/11/2018 CLINICAL DATA:  Acute right flank pain and hematuria. History of nephrolithiasis. EXAM: CT ABDOMEN AND PELVIS WITHOUT CONTRAST TECHNIQUE: Multidetector CT imaging of the abdomen and pelvis was performed  following the standard protocol without IV contrast. COMPARISON:  01/31/2018 CT abdomen/pelvis. FINDINGS: Lower chest: No significant pulmonary nodules or acute consolidative airspace disease. Coronary  atherosclerosis. Hepatobiliary: Normal liver size. No liver mass. Cholecystectomy. No biliary ductal dilatation. Pancreas: Normal, with no mass or duct dilation. Spleen: Normal size. No mass. Adrenals/Urinary Tract: Normal adrenals. No renal stones. No contour deforming renal masses. No hydronephrosis. Normal caliber ureters. No ureteral stones. Normal bladder. Stomach/Bowel: Small hiatal hernia. Otherwise normal nondistended stomach. Normal caliber small bowel with no small bowel wall thickening. Normal appendix. Mild diffuse colonic diverticulosis, with no large bowel wall thickening or significant pericolonic fat stranding. Vascular/Lymphatic: Atherosclerotic nonaneurysmal abdominal aorta. No pathologically enlarged lymph nodes in the abdomen or pelvis. Reproductive: Grossly normal uterus.  No adnexal mass. Other: No pneumoperitoneum, ascites or focal fluid collection. Musculoskeletal: No aggressive appearing focal osseous lesions. Mild thoracolumbar spondylosis IMPRESSION: 1. No acute abnormality.  No hydronephrosis.  No urolithiasis. 2. Small hiatal hernia. 3. Mild diffuse colonic diverticulosis. No evidence of acute diverticulitis. 4. Coronary atherosclerosis. 5.  Aortic Atherosclerosis (ICD10-I70.0). Electronically Signed   By: Ilona Sorrel M.D.   On: 07/11/2018 15:56    Procedures Procedures (including critical care time)  Medications Ordered in ED Medications  ondansetron (ZOFRAN) injection 4 mg (4 mg Intravenous Given 07/11/18 1544)  morphine 4 MG/ML injection 4 mg (4 mg Intravenous Given 07/11/18 1544)  HYDROmorphone (DILAUDID) injection 1 mg (1 mg Intravenous Given 07/11/18 1644)  sodium chloride 0.9 % bolus 1,000 mL (0 mLs Intravenous Stopped 07/11/18 1747)  ondansetron (ZOFRAN) injection 4 mg (4 mg Intravenous Given 07/11/18 1657)  ketorolac (TORADOL) 30 MG/ML injection 30 mg (30 mg Intravenous Given 07/11/18 1743)     Initial Impression / Assessment and Plan / ED Course  I have reviewed the  triage vital signs and the nursing notes.  Pertinent labs & imaging results that were available during my care of the patient were reviewed by me and considered in my medical decision making (see chart for details).    With a past medical history of kidney stones presents to the ED with complaints of right flank pain which began 3 days ago but woke up from her sleep this morning around 3 AM.  She also reports urinating with some blood present along with burning.   Primary evaluation patient appears uncomfortable, grabbing at her right flank, unsure whether this feels like her previous kidney stone episodes.  Patient was given morphine 4 mg will obtain laboratory Greening along with CT renal.  Differential diagnosis included but not limited to none disease, infected wound, UTI, cystitis.  BMP showed no electrolyte abnormality, slight elevation in glucose, creatinine level is unremarkable.  CBC showed no leukocytosis, hemoglobin slightly elevated.  UA showed no nitrites, leukocytes or white blood cell count.  CT renal: 1. No acute abnormality. No hydronephrosis. No urolithiasis.  2. Small hiatal hernia.  3. Mild diffuse colonic diverticulosis. No evidence of acute  diverticulitis.  4. Coronary atherosclerosis.  5. Aortic Atherosclerosis (ICD10-I70.0).       According to patient's records which I have reviewed, she had a previous CT on June 18, 2018, this showed a 3 mm nonobstructing stone.  Suspect patient likely passed a stone this morning.  We will provide her with Zofran along with fluids and Dilaudid to help with her pain.  5:54 PM patient was given Toradol to help with her symptoms, reports symptoms have improved.  She is ready for discharge home.  With stable vital signs asymptomatic,  stable for discharge.   Portions of this note were generated with Scientist, clinical (histocompatibility and immunogenetics)Dragon dictation software. Dictation errors may occur despite best attempts at proofreading.  Final Clinical Impressions(s) / ED  Diagnoses   Final diagnoses:  Right flank pain    ED Discharge Orders    None       Claude MangesSoto, [place], Cordelia Poche-C 07/11/18 1756    Azalia Bilisampos, Kevin, MD 07/12/18 0730

## 2018-07-13 LAB — URINE CULTURE: Culture: <10000 — AB

## 2018-09-12 ENCOUNTER — Encounter (HOSPITAL_BASED_OUTPATIENT_CLINIC_OR_DEPARTMENT_OTHER): Payer: Self-pay | Admitting: Emergency Medicine

## 2018-09-12 ENCOUNTER — Emergency Department (HOSPITAL_BASED_OUTPATIENT_CLINIC_OR_DEPARTMENT_OTHER): Payer: Medicare HMO

## 2018-09-12 ENCOUNTER — Emergency Department (HOSPITAL_BASED_OUTPATIENT_CLINIC_OR_DEPARTMENT_OTHER)
Admission: EM | Admit: 2018-09-12 | Discharge: 2018-09-12 | Disposition: A | Discharge status: Home / Self Care | Payer: Medicare HMO | Attending: Emergency Medicine | Admitting: Emergency Medicine

## 2018-09-12 ENCOUNTER — Other Ambulatory Visit: Payer: Self-pay

## 2018-09-12 DIAGNOSIS — R072 Precordial pain: Secondary | ICD-10-CM | POA: Diagnosis not present

## 2018-09-12 DIAGNOSIS — R0602 Shortness of breath: Secondary | ICD-10-CM | POA: Diagnosis not present

## 2018-09-12 DIAGNOSIS — I10 Essential (primary) hypertension: Secondary | ICD-10-CM | POA: Insufficient documentation

## 2018-09-12 DIAGNOSIS — F17210 Nicotine dependence, cigarettes, uncomplicated: Secondary | ICD-10-CM | POA: Diagnosis not present

## 2018-09-12 DIAGNOSIS — E785 Hyperlipidemia, unspecified: Secondary | ICD-10-CM | POA: Insufficient documentation

## 2018-09-12 DIAGNOSIS — R112 Nausea with vomiting, unspecified: Secondary | ICD-10-CM

## 2018-09-12 LAB — URINALYSIS, ROUTINE W REFLEX MICROSCOPIC
Bilirubin Urine: NEGATIVE
Glucose, UA: NEGATIVE mg/dL
Hgb urine dipstick: NEGATIVE
Ketones, ur: NEGATIVE mg/dL
Leukocytes,Ua: NEGATIVE
Nitrite: NEGATIVE
Protein, ur: NEGATIVE mg/dL
Specific Gravity, Urine: 1.02 (ref 1.005–1.030)
pH: 5.5 (ref 5.0–8.0)

## 2018-09-12 LAB — CBC WITH DIFFERENTIAL/PLATELET
Abs Immature Granulocytes: 0.04 10*3/uL (ref 0.00–0.07)
Basophils Absolute: 0.1 10*3/uL (ref 0.0–0.1)
Basophils Relative: 1 %
Eosinophils Absolute: 0.3 10*3/uL (ref 0.0–0.5)
Eosinophils Relative: 2 %
HCT: 47.4 % — ABNORMAL HIGH (ref 36.0–46.0)
Hemoglobin: 16.1 g/dL — ABNORMAL HIGH (ref 12.0–15.0)
Immature Granulocytes: 0 %
Lymphocytes Relative: 37 %
Lymphs Abs: 3.8 10*3/uL (ref 0.7–4.0)
MCH: 31.6 pg (ref 26.0–34.0)
MCHC: 34 g/dL (ref 30.0–36.0)
MCV: 93.1 fL (ref 80.0–100.0)
Monocytes Absolute: 0.9 10*3/uL (ref 0.1–1.0)
Monocytes Relative: 9 %
Neutro Abs: 5.2 10*3/uL (ref 1.7–7.7)
Neutrophils Relative %: 51 %
Platelets: 207 10*3/uL (ref 150–400)
RBC: 5.09 MIL/uL (ref 3.87–5.11)
RDW: 13.2 % (ref 11.5–15.5)
WBC: 10.3 10*3/uL (ref 4.0–10.5)
nRBC: 0 % (ref 0.0–0.2)

## 2018-09-12 LAB — COMPREHENSIVE METABOLIC PANEL
ALT: 20 U/L (ref 0–44)
AST: 20 U/L (ref 15–41)
Albumin: 4.2 g/dL (ref 3.5–5.0)
Alkaline Phosphatase: 65 U/L (ref 38–126)
Anion gap: 12 (ref 5–15)
BUN: 15 mg/dL (ref 6–20)
CO2: 22 mmol/L (ref 22–32)
Calcium: 9.4 mg/dL (ref 8.9–10.3)
Chloride: 101 mmol/L (ref 98–111)
Creatinine, Ser: 0.83 mg/dL (ref 0.44–1.00)
GFR calc Af Amer: >60 mL/min (ref >60)
GFR calc non Af Amer: >60 mL/min (ref >60)
Glucose, Bld: 102 mg/dL — ABNORMAL HIGH (ref 70–99)
Potassium: 4.7 mmol/L (ref 3.5–5.1)
Sodium: 135 mmol/L (ref 135–145)
Total Bilirubin: 0.9 mg/dL (ref 0.3–1.2)
Total Protein: 8.2 g/dL — ABNORMAL HIGH (ref 6.5–8.1)

## 2018-09-12 LAB — LIPASE, BLOOD: Lipase: 28 U/L (ref 11–51)

## 2018-09-12 LAB — PROTIME-INR
INR: 0.9 (ref 0.8–1.2)
Prothrombin Time: 12.4 seconds (ref 11.4–15.2)

## 2018-09-12 LAB — TROPONIN I (HIGH SENSITIVITY)
Troponin I (High Sensitivity): 6 ng/L (ref <18)
Troponin I (High Sensitivity): 5 ng/L (ref <18)

## 2018-09-12 MED ORDER — ALUM & MAG HYDROXIDE-SIMETH 200-200-20 MG/5ML PO SUSP
30.0000 mL | Freq: Once | ORAL | Status: AC
  Administered 2018-09-12: 30 mL via ORAL
  Filled 2018-09-12: qty 30

## 2018-09-12 MED ORDER — ONDANSETRON HCL 4 MG/2ML IJ SOLN
4.0000 mg | Freq: Once | INTRAMUSCULAR | Status: AC
  Administered 2018-09-12: 22:00:00 4 mg via INTRAVENOUS
  Filled 2018-09-12: qty 2

## 2018-09-12 MED ORDER — IOHEXOL 350 MG/ML SOLN
75.0000 mL | Freq: Once | INTRAVENOUS | Status: AC | PRN
  Administered 2018-09-12: 21:00:00 75 mL via INTRAVENOUS

## 2018-09-12 MED ORDER — PANTOPRAZOLE SODIUM 40 MG PO TBEC
DELAYED_RELEASE_TABLET | ORAL | Status: AC
  Administered 2018-09-12: 23:00:00 40 mg
  Filled 2018-09-12: qty 1

## 2018-09-12 MED ORDER — ACETAMINOPHEN 325 MG PO TABS
650.0000 mg | ORAL_TABLET | Freq: Once | ORAL | Status: AC
  Administered 2018-09-12: 650 mg via ORAL
  Filled 2018-09-12: qty 2

## 2018-09-12 MED ORDER — OMEPRAZOLE 20 MG PO CPDR: 20.0000 mg | DELAYED_RELEASE_CAPSULE | Freq: Every day | ORAL | 0 refills | Status: AC

## 2018-09-12 MED ORDER — PANTOPRAZOLE SODIUM 20 MG PO TBEC
20.0000 mg | DELAYED_RELEASE_TABLET | Freq: Once | ORAL | Status: DC
  Filled 2018-09-12: qty 1

## 2018-09-12 MED ORDER — NITROGLYCERIN 0.4 MG SL SUBL
0.4000 mg | SUBLINGUAL_TABLET | SUBLINGUAL | Status: DC | PRN
  Administered 2018-09-12 (×2): 0.4 mg via SUBLINGUAL
  Filled 2018-09-12: qty 1

## 2018-09-12 MED ORDER — ONDANSETRON HCL 4 MG/2ML IJ SOLN
4.0000 mg | Freq: Once | INTRAMUSCULAR | Status: AC
  Administered 2018-09-12: 4 mg via INTRAVENOUS
  Filled 2018-09-12: qty 2

## 2018-09-12 MED ORDER — ONDANSETRON 4 MG PO TBDP: 4.0000 mg | ORAL_TABLET | ORAL | 0 refills | Status: DC | PRN

## 2018-09-12 NOTE — ED Notes (Signed)
Pt ambulatory to BR with steady gait.

## 2018-09-12 NOTE — ED Notes (Signed)
Pt reports HA behind left eye and denies any change in chest pain.

## 2018-09-12 NOTE — ED Provider Notes (Signed)
[facility] EMERGENCY DEPARTMENT Provider Note   CSN: [id number] Arrival date & time: 09/12/18  [id number]     History   Chief Complaint Chief Complaint  Patient presents with  . Chest Pain  . Sore Throat    HPI Dana Morrow is a 53 y.o. female.     HPI Patient reports that she awakened this morning and vomited once.  She reports after she vomited she had a uncomfortable sensation in her upper abdomen and a tight feeling in her chest.  She reports that that continued throughout the day with some sensation of abdominal fullness and discomfort in a generally tight feeling around her rib cage with some radiation of discomfort into her back.  She is concerned because she feels like this is somewhat reminiscent of when she had a pulmonary embolus.  Denies she is been having any swelling or pain in her legs.  She reports that she was taken off of her blood thinners a little over a year ago.  She reports that 1 of her grandchildren had strep throat and she had exposure to them as well.  She reports he felt a little bit like she has had a sore throat but that has resolved at this point. Past Medical History:  Diagnosis Date  . Abdominal pain, chronic, left lower quadrant    a. s/p Laproscopy in 2004 w/o finding of source of discomfort.  . Anxiety and depression 2013   Lots of stress  . Bilateral pulmonary embolism (HCC) 08/08/2016   with right popliteal vein DVT--hospitalized and d/c'd home on eliquis.  . Chest pain    a. reportedly nl MV and echo 10/2011 - [place].  CP at the time of PE dx--was supposed to get stress test as outpt but she rescheduled x 1, then no showed.  . Cholelithiasis    a. s/p cholecystectomy 10/2011  . Chronic pain disorder   . Colon polyps    a. s/p colonoscopy and polypectomy in 2003  . Diverticulosis    with ? of 'itis  . Family history of colon cancer    mother  . GERD (gastroesophageal reflux disease)   . Hiatal hernia    a. small by EGD 2006.   [name]. History of amaurosis fugax 01/2011   L eye; saw neuro at [facility] (Dr. Johnell ComingsMieden).  Lab w/u and carotid doppler nl per old records.  . Hyperlipidemia    LDL 191 in 2013 per old records  . Hypertension   . Migraine   . RSD (reflex sympathetic dystrophy)    a. L shoulder--pain mgmt with Dr. Marlynn PerkingMalloy at [facility] [place]  . Tobacco abuse    a. 50 pack year hx.  . Visual disturbance    a. 12/2011 Left eye visual changes->w/u for TIA->Carotid U/S:  No ICA stenosis; Head CT: No acute abnl; MRI/MRA: mild RPCA stenosis; Echo: EF 55-60%, Triv AI, No PFO.    Patient Active Problem List   Diagnosis Date Noted  . Asymptomatic cholelithiasis 01/29/2017  . Diverticulosis 01/29/2017  . History of peptic ulcer 01/29/2017  . Hyperlipidemia 01/29/2017  . Headache 01/29/2017  . RSD (reflex sympathetic dystrophy) 01/29/2017  . RSD upper limb 01/29/2017  . Chronic narcotic use 12/25/2016  . Pleuritic chest pain 10/23/2016  . Dehydration 10/23/2016  . Leukocytosis 10/23/2016  . Tobacco abuse 10/23/2016  . 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 Bilateral pulmonary embolism (HCC) 08/08/2016  . Acute deep vein thrombosis (DVT) of right  popliteal vein (HCC) 08/08/2016  . Chronic pain syndrome 11/24/2015  . Essential hypertension 11/24/2015  . Complex regional pain syndrome I of upper limb 01/16/2013  . Lumbosacral radiculopathy 11/19/2012    Past Surgical History:  Procedure Laterality Date  . CESAREAN SECTION  1990  . COLONOSCOPY  10/2001   Dr. Ewing SchleinMagod; diverticulosis, int/ext hem, one small polyp removed.  . ENDOMETRIAL ABLATION  2011  . ESOPHAGOGASTRODUODENOSCOPY  02/2004   Dr. Ewing SchleinMagod; normal  . LAPAROSCOPIC CHOLECYSTECTOMY  10/19/11  . LAPAROSCOPY  05/2002   Dr. Harmon Dunimothy Davis--no cause for LLQ abd pain found  . SHOULDER SURGERY Left 1997 &98  . TRANSTHORACIC ECHOCARDIOGRAM  08/09/2016   Normal except grd I DD.  [name]. UMBILICAL HERNIA REPAIR   2014   with mesh     OB History   No obstetric history on file.      Home Medications    Prior to Admission medications   Medication Sig Start Date End Date Taking? Authorizing Provider  cloNIDine (CATAPRES) 0.3 MG tablet TAKE ONE TABLET BY MOUTH 3 TIMES DAILY 04/23/17  Yes McGowen, Maryjean MornPhilip H, MD  HYDROmorphone (DILAUDID) 2 MG tablet Take 2 mg by mouth every 4 (four) hours as needed for moderate pain or severe pain.    Yes [provider]  metoprolol succinate (TOPROL-XL) 50 MG 24 hr tablet Take 50 mg by mouth daily. 08/22/17  Yes [provider]  oxyCODONE-acetaminophen (PERCOCET) 10-325 MG tablet Take 1 tablet by mouth every 6 (six) hours as needed. 08/22/17  Yes [provider]  nitrofurantoin, macrocrystal-monohydrate, (MACROBID) 100 MG capsule Take 1 capsule (100 mg total) by mouth 2 (two) times daily. X 7 days Patient not taking: Reported on 04/23/2017 01/08/17   Palumbo, April, MD  ondansetron (ZOFRAN ODT) 4 MG disintegrating tablet Take 1 tablet (4 mg total) by mouth every 8 (eight) hours as needed for nausea or vomiting. Patient not taking: Reported on 09/15/2017 04/27/17   Little, Ambrose Finlandachel Morgan, MD  oxyCODONE-acetaminophen (ROXICET) 5-325 MG tablet Take 1 tablet by mouth every 6 (six) hours as needed. Patient not taking: Reported on 09/15/2017 08/09/16   Dimple NanasAmin, Ankit Chirag, MD  phenazopyridine (PYRIDIUM) 200 MG tablet Take 1 tablet (200 mg total) by mouth 3 (three) times daily. Patient not taking: Reported on 04/23/2017 01/08/17   Nicanor AlconPalumbo, April, MD  rivaroxaban (XARELTO) 20 MG TABS tablet Take 1 tablet (20 mg total) by mouth daily with supper. Patient not taking: Reported on 09/15/2017 10/24/16   Tyrone NineGrunz, Ryan B, MD    Family History Family History  Problem Relation Age of Onset  . Dementia Father   . Parkinson's disease Father        d. 2013  . Hypertension Father   . Hyperlipidemia Father   . Heart disease Father   . Heart attack Mother        died @ 5867  .  Colon cancer Mother   . Hyperlipidemia Mother   . Heart disease Mother   . Hypertension Mother   . Esophageal cancer Brother   . Diabetes Neg Hx     Social History Social History   Tobacco Use  . Smoking status: Current Every Day Smoker    Packs/day: 0.50    Years: 30.00    Pack years: 15.00    Types: Cigarettes  . Smokeless tobacco: Never Used  Substance Use Topics  . Alcohol use: No  . Drug use: No     Allergies   Flagyl [metronidazole], Elavil [amitriptyline], Klonopin [  clonazepam], and Levaquin [levofloxacin hemihydrate]   Review of Systems Review of Systems 10 Systems reviewed and are negative for acute change except as noted in the HPI.   Physical Exam Updated Vital Signs BP (!) 142/76   Pulse 71   Temp 98.3 F (36.8 C) (Oral)   Resp 17   Ht 5\' 3"  (1.6 m)   Wt 85.7 kg   SpO2 99%   BMI 33.48 kg/m   Physical Exam Constitutional:      Appearance: She is well-developed.  HENT:     Head: Normocephalic and atraumatic.  Eyes:     Extraocular Movements: Extraocular movements intact.     Conjunctiva/sclera: Conjunctivae normal.  Neck:     Musculoskeletal: Neck supple.  Cardiovascular:     Rate and Rhythm: Normal rate and regular rhythm.     Heart sounds: Normal heart sounds.  Pulmonary:     Effort: Pulmonary effort is normal.     Breath sounds: Normal breath sounds.  Chest:     Chest wall: No tenderness.  Abdominal:     General: Bowel sounds are normal. There is no distension.     Palpations: Abdomen is soft.     Tenderness: There is abdominal tenderness.     Comments: Mild discomfort in the epigastrium.  She reports this feels like it reproduces the area of discomfort.  Musculoskeletal: Normal range of motion.        General: No swelling or tenderness.  Skin:    General: Skin is warm and dry.  Neurological:     General: No focal deficit present.     Mental Status: She is alert and oriented to person, place, and time.     GCS: GCS eye subscore  is 4. GCS verbal subscore is 5. GCS motor subscore is 6.     Coordination: Coordination normal.  Psychiatric:        Mood and Affect: Mood normal.      ED Treatments / Results  Labs (all labs ordered are listed, but only abnormal results are displayed) Labs Reviewed  COMPREHENSIVE METABOLIC PANEL - Abnormal; Notable for the following components:      Result Value   Glucose, Bld 102 (*)    Total Protein 8.2 (*)    All other components within normal limits  CBC WITH DIFFERENTIAL/PLATELET - Abnormal; Notable for the following components:   Hemoglobin 16.1 (*)    HCT 47.4 (*)    All other components within normal limits  LIPASE, BLOOD  PROTIME-INR  URINALYSIS, ROUTINE W REFLEX MICROSCOPIC  TROPONIN I (HIGH SENSITIVITY)  TROPONIN I (HIGH SENSITIVITY)    EKG EKG Interpretation  Date/Time:  Saturday September 12 2018 18:59:32 EDT Ventricular Rate:  78 PR Interval:    QRS Duration: 87 QT Interval:  393 QTC Calculation: 448 R Axis:   66 Text Interpretation:  Sinus rhythm Probable left atrial enlargement Probable anteroseptal infarct, old Baseline wander in lead(s) V2 normal, no change from previous Confirmed by Charlesetta Shanks [phone]) on 09/12/2018 11:00:49 PM   Radiology Ct Angio Chest Pe W/cm &/or Wo Cm  Result Date: 09/12/2018 CLINICAL DATA:  53 year old female with chest pain. EXAM: CT ANGIOGRAPHY CHEST WITH CONTRAST TECHNIQUE: Multidetector CT imaging of the chest was performed using the standard protocol during bolus administration of intravenous contrast. Multiplanar CT image reconstructions and MIPs were obtained to evaluate the vascular anatomy. CONTRAST:  36mL OMNIPAQUE IOHEXOL 350 MG/ML SOLN COMPARISON:  Chest CT dated 04/23/2017 and radiograph dated 09/12/2018. FINDINGS: Cardiovascular: Top-normal  cardiac size. No pericardial effusion. Mild coronary vascular calcification noted. Mild atherosclerotic calcification of the aortic arch. No aneurysmal dilatation or dissection.  No CT evidence of pulmonary embolism. Mediastinum/Nodes: No hilar or mediastinal adenopathy. Small hiatal hernia. The esophagus and the thyroid gland are grossly unremarkable. No mediastinal fluid collection. Lungs/Pleura: Right middle lobe linear atelectasis/scarring. No focal consolidation, pleural effusion, or pneumothorax. Minimal perihilar and peribronchial hazy densities may represent atelectatic changes. Atypical infection or mild bronchitis is less likely but not excluded. Clinical correlation is recommended. The central airways are patent. Upper Abdomen: Cholecystectomy. Fatty liver. The visualized upper abdomen is otherwise unremarkable. Musculoskeletal: No chest wall abnormality. No acute or significant osseous findings. Review of the MIP images confirms the above findings. IMPRESSION: 1. No CT evidence of pulmonary embolism. 2. Minimal perihilar and central bronchial hazy density likely atelectatic changes. An infectious process or bronchitis to is less likely but not excluded. Clinical correlation is recommended. 3. Aortic Atherosclerosis (ICD10-I70.0). Electronically Signed   By: Elgie Collard M.D.   On: 09/12/2018 21:17   Dg Chest Port 1 View  Result Date: 09/12/2018 CLINICAL DATA:  53 year old female with back pain and sore throat. EXAM: PORTABLE CHEST 1 VIEW COMPARISON:  Chest CT dated 04/23/2017 and radiograph dated 07/30/2017 FINDINGS: Focal area of faint density in the right upper lung field most likely artifactual and vascular crowding. Attention on follow-up imaging recommended. There is no focal consolidation, pleural effusion, or pneumothorax. The cardiac silhouette is within normal limits. No acute osseous pathology. IMPRESSION: No active disease. Electronically Signed   By: Elgie Collard M.D.   On: 09/12/2018 19:55    Procedures Procedures (including critical care time)  Medications Ordered in ED Medications  nitroGLYCERIN (NITROSTAT) SL tablet 0.4 mg (0.4 mg Sublingual  Given 09/12/18 1957)  pantoprazole (PROTONIX) EC tablet 20 mg (has no administration in time range)  alum & mag hydroxide-simeth (MAALOX/MYLANTA) 200-200-20 MG/5ML suspension 30 mL (has no administration in time range)  pantoprazole (PROTONIX) 40 MG EC tablet (has no administration in time range)  acetaminophen (TYLENOL) tablet 650 mg (650 mg Oral Given 09/12/18 2013)  ondansetron (ZOFRAN) injection 4 mg (4 mg Intravenous Given 09/12/18 2013)  iohexol (OMNIPAQUE) 350 MG/ML injection 75 mL (75 mLs Intravenous Contrast Given 09/12/18 2052)  ondansetron (ZOFRAN) injection 4 mg (4 mg Intravenous Given 09/12/18 2155)     Initial Impression / Assessment and Plan / ED Course  I have reviewed the triage vital signs and the nursing notes.  Pertinent labs & imaging results that were available during my care of the patient were reviewed by me and considered in my medical decision making (see chart for details).       Patient had a combination of symptoms with GI discomfort and vomiting earlier in the day.  She did experience some chest discomfort which she felt was reminiscent of chest pain with prior PE.  Troponin is normal and EKG is unchanged.  She experienced no change in her discomfort with nitroglycerin.  CT PE study shows no PE or other acute abnormality.  Patient symptoms are improved with antiemetic and her Dilaudid that she takes scheduled at home.  This time I have low suspicion for cardiac ischemic etiology.  Will have patient follow-up with her PCP.  There is an element of this suggestive of reflux and gastritis.  She reports she has not been taking a PPI for over a year.  Will reinstitute Protonix.  Return precautions reviewed.  She is clinically well in appearance.  She is nontoxic without fever or signs of acute infectious etiology.  Final Clinical Impressions(s) / ED Diagnoses   Final diagnoses:  Substernal precordial chest pain  Mild nausea and vomiting  Shortness of breath    ED Discharge  Orders    None       Arby Barrette, MD 09/12/18 2305

## 2018-09-12 NOTE — ED Notes (Signed)
Patient transported to CT 

## 2018-09-12 NOTE — ED Triage Notes (Signed)
Reports abdominal pain and sore throat that started this morning with n/v.  Vomited X 1.  [place] daughter was diagnosed with strep a few days ago. Also c/o central chest pressure that goes through to back that started around 1430 with sob.  Hx of PE.  Reports this feels the same.

## 2018-09-12 NOTE — ED Notes (Signed)
Pt took 2mg  of dilaudid oral per her personal medication list. EDP made aware

## 2018-09-12 NOTE — Discharge Instructions (Addendum)
1.  Start Prilosec daily. 2.  Continue all of your regular medications. 3.  Schedule follow-up with your doctor for recheck. 4.  Return to the emergency department if your symptoms return, worsen or new concerning symptoms develop.

## 2018-12-07 ENCOUNTER — Encounter (HOSPITAL_BASED_OUTPATIENT_CLINIC_OR_DEPARTMENT_OTHER): Payer: Self-pay | Admitting: *Deleted

## 2018-12-07 ENCOUNTER — Other Ambulatory Visit: Payer: Self-pay

## 2018-12-07 ENCOUNTER — Emergency Department (HOSPITAL_BASED_OUTPATIENT_CLINIC_OR_DEPARTMENT_OTHER): Payer: Medicare HMO

## 2018-12-07 ENCOUNTER — Emergency Department (HOSPITAL_BASED_OUTPATIENT_CLINIC_OR_DEPARTMENT_OTHER)
Admission: EM | Admit: 2018-12-07 | Discharge: 2018-12-07 | Disposition: A | Discharge status: Home / Self Care | Payer: Medicare HMO | Attending: Emergency Medicine | Admitting: Emergency Medicine

## 2018-12-07 DIAGNOSIS — F17210 Nicotine dependence, cigarettes, uncomplicated: Secondary | ICD-10-CM | POA: Diagnosis not present

## 2018-12-07 DIAGNOSIS — Z79899 Other long term (current) drug therapy: Secondary | ICD-10-CM | POA: Insufficient documentation

## 2018-12-07 DIAGNOSIS — G894 Chronic pain syndrome: Secondary | ICD-10-CM | POA: Insufficient documentation

## 2018-12-07 DIAGNOSIS — R1084 Generalized abdominal pain: Secondary | ICD-10-CM | POA: Insufficient documentation

## 2018-12-07 DIAGNOSIS — R1032 Left lower quadrant pain: Secondary | ICD-10-CM | POA: Diagnosis present

## 2018-12-07 DIAGNOSIS — R112 Nausea with vomiting, unspecified: Secondary | ICD-10-CM

## 2018-12-07 DIAGNOSIS — I10 Essential (primary) hypertension: Secondary | ICD-10-CM | POA: Diagnosis not present

## 2018-12-07 LAB — URINALYSIS, ROUTINE W REFLEX MICROSCOPIC
Bilirubin Urine: NEGATIVE
Glucose, UA: NEGATIVE mg/dL
Hgb urine dipstick: NEGATIVE
Ketones, ur: NEGATIVE mg/dL
Leukocytes,Ua: NEGATIVE
Nitrite: NEGATIVE
Protein, ur: NEGATIVE mg/dL
Specific Gravity, Urine: 1.015 (ref 1.005–1.030)
pH: 6 (ref 5.0–8.0)

## 2018-12-07 LAB — COMPREHENSIVE METABOLIC PANEL
ALT: 17 U/L (ref 0–44)
AST: 18 U/L (ref 15–41)
Albumin: 4.6 g/dL (ref 3.5–5.0)
Alkaline Phosphatase: 71 U/L (ref 38–126)
Anion gap: 9 (ref 5–15)
BUN: 11 mg/dL (ref 6–20)
CO2: 22 mmol/L (ref 22–32)
Calcium: 9.7 mg/dL (ref 8.9–10.3)
Chloride: 107 mmol/L (ref 98–111)
Creatinine, Ser: 0.8 mg/dL (ref 0.44–1.00)
GFR calc Af Amer: >60 mL/min (ref >60)
GFR calc non Af Amer: >60 mL/min (ref >60)
Glucose, Bld: 109 mg/dL — ABNORMAL HIGH (ref 70–99)
Potassium: 3.9 mmol/L (ref 3.5–5.1)
Sodium: 138 mmol/L (ref 135–145)
Total Bilirubin: 0.8 mg/dL (ref 0.3–1.2)
Total Protein: 8.7 g/dL — ABNORMAL HIGH (ref 6.5–8.1)

## 2018-12-07 LAB — CBC WITH DIFFERENTIAL/PLATELET
Abs Immature Granulocytes: 0.03 10*3/uL (ref 0.00–0.07)
Basophils Absolute: 0.1 10*3/uL (ref 0.0–0.1)
Basophils Relative: 1 %
Eosinophils Absolute: 0.2 10*3/uL (ref 0.0–0.5)
Eosinophils Relative: 1 %
HCT: 49.6 % — ABNORMAL HIGH (ref 36.0–46.0)
Hemoglobin: 16.6 g/dL — ABNORMAL HIGH (ref 12.0–15.0)
Immature Granulocytes: 0 %
Lymphocytes Relative: 27 %
Lymphs Abs: 3.3 10*3/uL (ref 0.7–4.0)
MCH: 31.5 pg (ref 26.0–34.0)
MCHC: 33.5 g/dL (ref 30.0–36.0)
MCV: 94.1 fL (ref 80.0–100.0)
Monocytes Absolute: 0.8 10*3/uL (ref 0.1–1.0)
Monocytes Relative: 6 %
Neutro Abs: 7.9 10*3/uL — ABNORMAL HIGH (ref 1.7–7.7)
Neutrophils Relative %: 65 %
Platelets: 242 10*3/uL (ref 150–400)
RBC: 5.27 MIL/uL — ABNORMAL HIGH (ref 3.87–5.11)
RDW: 13 % (ref 11.5–15.5)
WBC: 12.2 10*3/uL — ABNORMAL HIGH (ref 4.0–10.5)
nRBC: 0 % (ref 0.0–0.2)

## 2018-12-07 LAB — LIPASE, BLOOD: Lipase: 24 U/L (ref 11–51)

## 2018-12-07 MED ORDER — SODIUM CHLORIDE 0.9 % IV BOLUS
1000.0000 mL | Freq: Once | INTRAVENOUS | Status: AC
  Administered 2018-12-07: 1000 mL via INTRAVENOUS

## 2018-12-07 MED ORDER — ONDANSETRON 4 MG PO TBDP: 4.0000 mg | ORAL_TABLET | Freq: Three times a day (TID) | ORAL | 0 refills | Status: AC | PRN

## 2018-12-07 MED ORDER — ONDANSETRON 4 MG PO TBDP
4.0000 mg | ORAL_TABLET | Freq: Once | ORAL | Status: AC | PRN
  Administered 2018-12-07: 4 mg via ORAL
  Filled 2018-12-07: qty 1

## 2018-12-07 MED ORDER — HYDROMORPHONE HCL 1 MG/ML IJ SOLN
2.0000 mg | Freq: Once | INTRAMUSCULAR | Status: AC
  Administered 2018-12-07: 2 mg via INTRAVENOUS
  Filled 2018-12-07: qty 2

## 2018-12-07 MED ORDER — ONDANSETRON HCL 4 MG/2ML IJ SOLN
4.0000 mg | Freq: Once | INTRAMUSCULAR | Status: AC
  Administered 2018-12-07: 4 mg via INTRAVENOUS
  Filled 2018-12-07: qty 2

## 2018-12-07 MED ORDER — DICYCLOMINE HCL 20 MG PO TABS: 20.0000 mg | ORAL_TABLET | Freq: Two times a day (BID) | ORAL | 0 refills | Status: AC

## 2018-12-07 MED ORDER — IOHEXOL 300 MG/ML  SOLN
100.0000 mL | Freq: Once | INTRAMUSCULAR | Status: AC | PRN
  Administered 2018-12-07: 100 mL via INTRAVENOUS

## 2018-12-07 NOTE — Discharge Instructions (Addendum)
Your work-up has been very reassuring in the emergency department today.  Unknown cause of your abdominal pain and vomiting.  May be secondary to a viral GI bug.  I would recommend taking Zofran at home for nausea.  Have given you Bentyl for any abdominal cramping.  Can continue your your pain medication at home.  I would recommend something for constipation.  Can take MiraLAX along with Senokot-S.  Your blood pressure is mildly elevated.  Continue to have this rechecked and follow-up with your primary care doctor in the outpatient setting tomorrow.  Return to the ER if you develop any worsening symptoms.  Would recommend following up with a GI doctor as you may need a colonoscopy at some point.

## 2018-12-07 NOTE — ED Provider Notes (Signed)
[facility] EMERGENCY DEPARTMENT Provider Note   CSN: [id number] Arrival date & time: 12/07/18  1048     History   Chief Complaint Chief Complaint  Patient presents with  . Abdominal Pain  . Emesis    HPI Dana Morrow is a 53 y.o. female.     HPI 53 year old female with a past medical history significant for reflex sympathetic dystrophy, chronic pain on Dilaudid and oxycodone for many years, diverticulosis, hyperlipidemia, hypertension who presents to the emergency department today for evaluation of abdominal pain.  Began yesterday.  Pain was initially intermittent but however has become more constant.  Pain localized to the left lower quadrant.  Describes the pain as sharp in nature.  Radiates to lower back.  Denies any associated urinary symptoms.  Reports last bowel movement 3 days ago.  Is passing gas.  No melena or hematochezia.  Ports some nausea no vomiting.  No fevers or chills.  No recent sick contacts.  No recent travel.  She did take her Dilaudid and oxycodone at home for pain without any significant relief.  She tried to get an appoint with her primary care doctor however they were unable to get her in till tomorrow. Past Medical History:  Diagnosis Date  . Abdominal pain, chronic, left lower quadrant    a. s/p Laproscopy in 2004 w/o finding of source of discomfort.  . Anxiety and depression 2013   Lots of stress  . Bilateral pulmonary embolism (HCC) 08/08/2016   with right popliteal vein DVT--hospitalized and d/c'd home on eliquis.  . Chest pain    a. reportedly nl MV and echo 10/2011 - [place].  CP at the time of PE dx--was supposed to get stress test as outpt but she rescheduled x 1, then no showed.  . Cholelithiasis    a. s/p cholecystectomy 10/2011  . Chronic pain disorder   . Colon polyps    a. s/p colonoscopy and polypectomy in 2003  . Diverticulosis    with ? of 'itis  . Family history of colon cancer    mother  . GERD (gastroesophageal  reflux disease)   . Hiatal hernia    a. small by EGD 2006.  [name] History of amaurosis fugax 01/2011   L eye; saw neuro at [facility] (Dr. Johnell Comings).  Lab w/u and carotid doppler nl per old records.  . Hyperlipidemia    LDL 191 in 2013 per old records  . Hypertension   . Migraine   . RSD (reflex sympathetic dystrophy)    a. L shoulder--pain mgmt with Dr. Marlynn Perking at [facility] in [place]  . Tobacco abuse    a. 50 pack year hx.  . Visual disturbance    a. 12/2011 Left eye visual changes->w/u for TIA->Carotid U/S:  No ICA stenosis; Head CT: No acute abnl; MRI/MRA: mild RPCA stenosis; Echo: EF 55-60%, Triv AI, No PFO.    Patient Active Problem List   Diagnosis Date Noted  . Asymptomatic cholelithiasis 01/29/2017  . Diverticulosis 01/29/2017  . History of peptic ulcer 01/29/2017  . Hyperlipidemia 01/29/2017  . Headache 01/29/2017  . RSD (reflex sympathetic dystrophy) 01/29/2017  . RSD upper limb 01/29/2017  . Chronic narcotic use 12/25/2016  . Pleuritic chest pain 10/23/2016  . Dehydration 10/23/2016  . Leukocytosis 10/23/2016  . Tobacco abuse 10/23/2016  . 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 Bilateral pulmonary embolism (HCC) 08/08/2016  . Acute deep vein thrombosis (DVT) of right popliteal vein (HCC) 08/08/2016  .  Chronic pain syndrome 11/24/2015  . Essential hypertension 11/24/2015  . Complex regional pain syndrome I of upper limb 01/16/2013  . Lumbosacral radiculopathy 11/19/2012    Past Surgical History:  Procedure Laterality Date  . CESAREAN SECTION  1990  . COLONOSCOPY  10/2001   Dr. Ewing SchleinMagod; diverticulosis, int/ext hem, one small polyp removed.  . ENDOMETRIAL ABLATION  2011  . ESOPHAGOGASTRODUODENOSCOPY  02/2004   Dr. Ewing SchleinMagod; normal  . LAPAROSCOPIC CHOLECYSTECTOMY  10/19/11  . LAPAROSCOPY  05/2002   Dr. Harmon Dunimothy Davis--no cause for LLQ abd pain found  . SHOULDER SURGERY Left 1997 &98  . TRANSTHORACIC ECHOCARDIOGRAM   08/09/2016   Normal except grd I DD.  [name]. UMBILICAL HERNIA REPAIR  2014   with mesh     OB History   No obstetric history on file.      Home Medications    Prior to Admission medications   Medication Sig Start Date End Date Taking? Authorizing Provider  cloNIDine (CATAPRES) 0.3 MG tablet TAKE ONE TABLET BY MOUTH 3 TIMES DAILY 04/23/17   McGowen, Maryjean MornPhilip H, MD  HYDROmorphone (DILAUDID) 2 MG tablet Take 2 mg by mouth every 4 (four) hours as needed for moderate pain or severe pain.     [provider]  metoprolol succinate (TOPROL-XL) 50 MG 24 hr tablet Take 50 mg by mouth daily. 08/22/17   [provider]  nitrofurantoin, macrocrystal-monohydrate, (MACROBID) 100 MG capsule Take 1 capsule (100 mg total) by mouth 2 (two) times daily. X 7 days Patient not taking: Reported on 04/23/2017 01/08/17   Palumbo, April, MD  omeprazole (PRILOSEC) 20 MG capsule Take 1 capsule (20 mg total) by mouth daily. 09/12/18   Arby BarrettePfeiffer, Marcy, MD  ondansetron (ZOFRAN ODT) 4 MG disintegrating tablet Take 1 tablet (4 mg total) by mouth every 8 (eight) hours as needed for nausea or vomiting. Patient not taking: Reported on 09/15/2017 04/27/17   Little, Ambrose Finlandachel Morgan, MD  ondansetron (ZOFRAN ODT) 4 MG disintegrating tablet Take 1 tablet (4 mg total) by mouth every 4 (four) hours as needed for nausea or vomiting. 09/12/18   Arby BarrettePfeiffer, Marcy, MD  oxyCODONE-acetaminophen (PERCOCET) 10-325 MG tablet Take 1 tablet by mouth every 6 (six) hours as needed. 08/22/17   [provider]  oxyCODONE-acetaminophen (ROXICET) 5-325 MG tablet Take 1 tablet by mouth every 6 (six) hours as needed. Patient not taking: Reported on 09/15/2017 08/09/16   Dimple NanasAmin, Ankit Chirag, MD  phenazopyridine (PYRIDIUM) 200 MG tablet Take 1 tablet (200 mg total) by mouth 3 (three) times daily. Patient not taking: Reported on 04/23/2017 01/08/17   Nicanor AlconPalumbo, April, MD  rivaroxaban (XARELTO) 20 MG TABS tablet Take 1 tablet (20 mg total) by mouth daily with  supper. Patient not taking: Reported on 09/15/2017 10/24/16   Tyrone NineGrunz, Ryan B, MD    Family History Family History  Problem Relation Age of Onset  . Dementia Father   . Parkinson's disease Father        d. 2013  . Hypertension Father   . Hyperlipidemia Father   . Heart disease Father   . Heart attack Mother        died @ 9167  . Colon cancer Mother   . Hyperlipidemia Mother   . Heart disease Mother   . Hypertension Mother   . Esophageal cancer Brother   . Diabetes Neg Hx     Social History Social History   Tobacco Use  . Smoking status: Current Every Day Smoker    Packs/day: 0.50  Years: 30.00    Pack years: 15.00    Types: Cigarettes  . Smokeless tobacco: Never Used  Substance Use Topics  . Alcohol use: No  . Drug use: No     Allergies   Flagyl [metronidazole], Elavil [amitriptyline], Klonopin [clonazepam], and Levaquin [levofloxacin hemihydrate]   Review of Systems Review of Systems  Constitutional: Negative for chills and fever.  HENT: Negative for congestion.   Eyes: Negative for discharge.  Respiratory: Negative for cough.   Cardiovascular: Negative for chest pain.  Gastrointestinal: Positive for abdominal pain, constipation and nausea. Negative for blood in stool, diarrhea and vomiting.  Genitourinary: Negative for dysuria, frequency, hematuria and urgency.  Musculoskeletal: Negative for myalgias.  Skin: Negative for color change.  Neurological: Negative for headaches.  Psychiatric/Behavioral: Negative for confusion.     Physical Exam Updated Vital Signs BP (!) 182/83   Pulse 85   Temp 98.5 F (36.9 C) (Oral)   Resp 20   Ht 5\' 3"  (1.6 m)   Wt 83.9 kg   SpO2 100%   BMI 32.77 kg/m   Physical Exam Vitals signs and nursing note reviewed.  Constitutional:      General: She is not in acute distress.    Appearance: She is well-developed. She is not ill-appearing or toxic-appearing.  HENT:     Head: Normocephalic and atraumatic.     Nose: Nose  normal.  Eyes:     General: No scleral icterus.       Right eye: No discharge.        Left eye: No discharge.     Conjunctiva/sclera: Conjunctivae normal.     Pupils: Pupils are equal, round, and reactive to light.  Neck:     Musculoskeletal: Normal range of motion and neck supple.  Cardiovascular:     Rate and Rhythm: Normal rate and regular rhythm.     Heart sounds: Normal heart sounds. No murmur. No friction rub. No gallop.   Pulmonary:     Effort: Pulmonary effort is normal. No respiratory distress.     Breath sounds: Normal breath sounds. No stridor. No wheezing, rhonchi or rales.  Chest:     Chest wall: No tenderness.  Abdominal:     General: Bowel sounds are normal.     Palpations: Abdomen is soft.     Tenderness: There is abdominal tenderness in the left lower quadrant. There is no right CVA tenderness, left CVA tenderness, guarding or rebound. Negative signs include Murphy's sign and McBurney's sign.  Musculoskeletal: Normal range of motion.        General: No tenderness.  Lymphadenopathy:     Cervical: No cervical adenopathy.  Skin:    General: Skin is warm and dry.     Capillary Refill: Capillary refill takes less than 2 seconds.     Coloration: Skin is not pale.  Neurological:     Mental Status: She is alert and oriented to person, place, and time.  Psychiatric:        Mood and Affect: Mood normal.        Behavior: Behavior normal.        Thought Content: Thought content normal.        Judgment: Judgment normal.      ED Treatments / Results  Labs (all labs ordered are listed, but only abnormal results are displayed) Labs Reviewed  COMPREHENSIVE METABOLIC PANEL - Abnormal; Notable for the following components:      Result Value   Glucose, Bld 109 (*)  Total Protein 8.7 (*)    All other components within normal limits  CBC WITH DIFFERENTIAL/PLATELET - Abnormal; Notable for the following components:   WBC 12.2 (*)    RBC 5.27 (*)    Hemoglobin 16.6 (*)     HCT 49.6 (*)    Neutro Abs 7.9 (*)    All other components within normal limits  LIPASE, BLOOD  URINALYSIS, ROUTINE W REFLEX MICROSCOPIC  CBC WITH DIFFERENTIAL/PLATELET    EKG None  Radiology Ct Abdomen Pelvis W Contrast  Result Date: 12/07/2018 CLINICAL DATA:  Abdominal pain and vomiting EXAM: CT ABDOMEN AND PELVIS WITH CONTRAST TECHNIQUE: Multidetector CT imaging of the abdomen and pelvis was performed using the standard protocol following bolus administration of intravenous contrast. CONTRAST:  119mL OMNIPAQUE IOHEXOL 300 MG/ML  SOLN COMPARISON:  July 11, 2018 FINDINGS: Lower chest: No acute abnormality. Hepatobiliary: No focal liver abnormality. There is stable mild intrahepatic ductal dilatation. Stable dilatation of the common bile duct with tapering distally. Cholecystectomy. Pancreas: Unremarkable. No pancreatic ductal dilatation or surrounding inflammatory changes. Spleen: Normal in size without focal abnormality. Adrenals/Urinary Tract: Adrenal glands are unremarkable. Kidneys are normal, without renal calculi, focal lesion, or hydronephrosis. Bladder is unremarkable. Stomach/Bowel: Stomach is unremarkable. Distal colonic diverticulosis without evidence of diverticulitis. Normal appendix. Small bowel is normal in caliber. Vascular/Lymphatic: Aortic atherosclerosis. No enlarged abdominal or pelvic lymph nodes. Reproductive: Uterus and bilateral adnexa are unremarkable. Other: No abdominal wall hernia or abnormality. No abdominopelvic ascites. Musculoskeletal: No acute or significant osseous findings. IMPRESSION: No new or acute findings to account for reported symptoms. Stable chronic/nonemergent findings detailed above. Electronically Signed   By: Macy Mis M.D.   On: 12/07/2018 13:33    Procedures Procedures (including critical care time)  Medications Ordered in ED Medications  ondansetron (ZOFRAN-ODT) disintegrating tablet 4 mg (4 mg Oral Given 12/07/18 1137)  HYDROmorphone  (DILAUDID) injection 2 mg (2 mg Intravenous Given 12/07/18 1226)  ondansetron (ZOFRAN) injection 4 mg (4 mg Intravenous Given 12/07/18 1226)  sodium chloride 0.9 % bolus 1,000 mL (1,000 mLs Intravenous New Bag/Given 12/07/18 1218)  iohexol (OMNIPAQUE) 300 MG/ML solution 100 mL (100 mLs Intravenous Contrast Given 12/07/18 1324)     Initial Impression / Assessment and Plan / ED Course  I have reviewed the triage vital signs and the nursing notes.  Pertinent labs & imaging results that were available during my care of the patient were reviewed by me and considered in my medical decision making (see chart for details).        53 year old female with chronic pain syndrome presents to the emergency department today for evaluation of left lower quadrant abdominal pain.  Patient is very well-appearing.  Vital signs are reassuring.  Differential diagnosis includes nephrolithiasis, diverticulitis, appendicitis, bowel obstruction, colitis, UTI.  Less likely mesenteric ischemia.  Will obtain basic lab work and imaging.  Patient does have history of narcotic use including daily Dilaudid and oxycodone.  Discussed with patient may be difficult to control her pain given pain tolerance.  On exam patient has no peritoneal signs.  Bowel sounds are present.  No CVA tenderness.  Labs are reassuring.  Mild hypertension with history of same.  Patient is afebrile no significant tachycardia noted.  Labs show mild leukocytosis of 12,000.  Hemoglobin is 16 in the consistent with patient's baseline.  Normal lipase.  No significant electrolyte derangement.  UA shows no signs of infection.  CT scan obtained shows no acute intra-abdominal findings to explain patient's pain.  Unsure of  etiology of patient's pain peer may be secondary to a viral enteritis.  Patient vomiting and pain improved in the ER.  Given fluid bolus.  Able to ambulate to the bath without any difficulties.  Given the patient had a bowel movement several  days and that she is on chronic narcotic pain medication I discussed patient to continue taking the stool softener and MiraLAX at home she states she stopped this several months ago.  Patient will also be given Zofran and Bentyl.  She likely needs GI follow-up for colonoscopy if indicated.  Has an appoint with her primary care doctor tomorrow morning.  I discussed that if her symptoms worsen she should return to the ER.  Final Clinical Impressions(s) / ED Diagnoses   Final diagnoses:  Generalized abdominal pain  Nausea and vomiting, intractability of vomiting not specified, unspecified vomiting type    ED Discharge Orders         Ordered    ondansetron (ZOFRAN ODT) 4 MG disintegrating tablet  Every 8 hours PRN     12/07/18 1400    dicyclomine (BENTYL) 20 MG tablet  2 times daily     12/07/18 1400           Rise Mu, [place] 12/07/18 1407    Terald Sleeper, MD 12/07/18 [phone]

## 2018-12-07 NOTE — ED Notes (Signed)
Right AC attempt for IV, unable to thread, blood for labs orders complete.

## 2018-12-07 NOTE — ED Triage Notes (Signed)
Abdominal pain and vomiting since 3am. No diarrhea.

## 2019-12-08 DEATH — deceased

## 2022-01-02 NOTE — Progress Notes (Signed)
This encounter was created in error - please disregard.
